# Patient Record
Sex: Female | Born: 1955
Health system: Southern US, Community
[De-identification: ages and names within clinical notes are randomized; demographics above are authoritative.]

## PROBLEM LIST (undated history)

## (undated) DIAGNOSIS — M51369 Other intervertebral disc degeneration, lumbar region without mention of lumbar back pain or lower extremity pain: Secondary | ICD-10-CM

## (undated) DIAGNOSIS — R21 Rash and other nonspecific skin eruption: Secondary | ICD-10-CM

## (undated) DIAGNOSIS — E039 Hypothyroidism, unspecified: Secondary | ICD-10-CM

## (undated) DIAGNOSIS — R2242 Localized swelling, mass and lump, left lower limb: Secondary | ICD-10-CM

## (undated) DIAGNOSIS — M5136 Other intervertebral disc degeneration, lumbar region: Secondary | ICD-10-CM

## (undated) DIAGNOSIS — H04123 Dry eye syndrome of bilateral lacrimal glands: Secondary | ICD-10-CM

## (undated) DIAGNOSIS — D510 Vitamin B12 deficiency anemia due to intrinsic factor deficiency: Secondary | ICD-10-CM

## (undated) DIAGNOSIS — R1310 Dysphagia, unspecified: Secondary | ICD-10-CM

## (undated) DIAGNOSIS — M3390 Dermatopolymyositis, unspecified, organ involvement unspecified: Secondary | ICD-10-CM

## (undated) DIAGNOSIS — R682 Dry mouth, unspecified: Secondary | ICD-10-CM

## (undated) DIAGNOSIS — C4491 Basal cell carcinoma of skin, unspecified: Secondary | ICD-10-CM

## (undated) HISTORY — DX: Basal cell carcinoma of skin, unspecified: C44.91

## (undated) HISTORY — DX: Vitamin B12 deficiency anemia due to intrinsic factor deficiency: D51.0

## (undated) HISTORY — PX: OTHER SURGICAL HISTORY: SHX169

## (undated) HISTORY — DX: Hypothyroidism, unspecified: E03.9

---

## 1996-09-21 HISTORY — PX: BACK SURGERY: SHX140

## 2001-04-19 ENCOUNTER — Encounter: Payer: Self-pay | Admitting: Unknown Physician Specialty

## 2001-04-19 ENCOUNTER — Encounter: Admission: RE | Admit: 2001-04-19 | Discharge: 2001-04-19 | Payer: Self-pay | Admitting: Unknown Physician Specialty

## 2003-07-17 ENCOUNTER — Encounter: Admission: RE | Admit: 2003-07-17 | Discharge: 2003-07-17 | Payer: Self-pay | Admitting: Unknown Physician Specialty

## 2004-08-19 ENCOUNTER — Encounter: Admission: RE | Admit: 2004-08-19 | Discharge: 2004-08-19 | Payer: Self-pay | Admitting: Unknown Physician Specialty

## 2005-07-16 ENCOUNTER — Other Ambulatory Visit: Admission: RE | Admit: 2005-07-16 | Discharge: 2005-07-16 | Payer: Self-pay | Admitting: Unknown Physician Specialty

## 2005-12-18 ENCOUNTER — Encounter: Admission: RE | Admit: 2005-12-18 | Discharge: 2005-12-18 | Payer: Self-pay | Admitting: Unknown Physician Specialty

## 2007-01-05 ENCOUNTER — Encounter: Admission: RE | Admit: 2007-01-05 | Discharge: 2007-01-05 | Payer: Self-pay | Admitting: Unknown Physician Specialty

## 2008-09-24 ENCOUNTER — Encounter: Admission: RE | Admit: 2008-09-24 | Discharge: 2008-09-24 | Payer: Self-pay | Admitting: Unknown Physician Specialty

## 2010-10-11 ENCOUNTER — Encounter: Payer: Self-pay | Admitting: Unknown Physician Specialty

## 2011-02-17 ENCOUNTER — Other Ambulatory Visit: Payer: Self-pay | Admitting: Unknown Physician Specialty

## 2011-02-17 DIAGNOSIS — Z1231 Encounter for screening mammogram for malignant neoplasm of breast: Secondary | ICD-10-CM

## 2011-03-17 ENCOUNTER — Ambulatory Visit: Payer: Self-pay

## 2011-04-02 ENCOUNTER — Ambulatory Visit
Admission: RE | Admit: 2011-04-02 | Discharge: 2011-04-02 | Disposition: A | Discharge status: Home / Self Care | Payer: No Typology Code available for payment source | Source: Ambulatory Visit | Attending: Unknown Physician Specialty | Admitting: Unknown Physician Specialty

## 2011-04-02 DIAGNOSIS — Z1231 Encounter for screening mammogram for malignant neoplasm of breast: Secondary | ICD-10-CM

## 2011-12-29 ENCOUNTER — Other Ambulatory Visit: Payer: Self-pay | Admitting: Neurosurgery

## 2011-12-29 DIAGNOSIS — M47816 Spondylosis without myelopathy or radiculopathy, lumbar region: Secondary | ICD-10-CM

## 2011-12-30 ENCOUNTER — Ambulatory Visit
Admission: RE | Admit: 2011-12-30 | Discharge: 2011-12-30 | Disposition: A | Discharge status: Home / Self Care | Payer: No Typology Code available for payment source | Source: Ambulatory Visit | Attending: Neurosurgery | Admitting: Neurosurgery

## 2011-12-30 VITALS — BP 168/80 | HR 67 | Ht 68.0 in | Wt 175.0 lb

## 2011-12-30 DIAGNOSIS — M47816 Spondylosis without myelopathy or radiculopathy, lumbar region: Secondary | ICD-10-CM

## 2011-12-30 MED ORDER — METHYLPREDNISOLONE ACETATE 40 MG/ML INJ SUSP (RADIOLOG
80.0000 mg | Freq: Once | INTRAMUSCULAR | Status: AC
  Administered 2011-12-30: 80 mg via EPIDURAL

## 2011-12-30 MED ORDER — IOHEXOL 180 MG/ML  SOLN
1.0000 mL | Freq: Once | INTRAMUSCULAR | Status: AC | PRN
  Administered 2011-12-30: 1 mL via INTRA_ARTICULAR

## 2011-12-30 NOTE — Discharge Instructions (Signed)

## 2012-08-29 ENCOUNTER — Other Ambulatory Visit: Payer: Self-pay | Admitting: Internal Medicine

## 2012-08-29 DIAGNOSIS — Z1231 Encounter for screening mammogram for malignant neoplasm of breast: Secondary | ICD-10-CM

## 2013-01-05 ENCOUNTER — Encounter: Payer: Self-pay | Admitting: Cardiology

## 2013-01-11 ENCOUNTER — Ambulatory Visit (INDEPENDENT_AMBULATORY_CARE_PROVIDER_SITE_OTHER): Payer: No Typology Code available for payment source

## 2013-01-11 DIAGNOSIS — G5602 Carpal tunnel syndrome, left upper limb: Secondary | ICD-10-CM

## 2013-01-11 DIAGNOSIS — G5601 Carpal tunnel syndrome, right upper limb: Secondary | ICD-10-CM

## 2013-01-11 DIAGNOSIS — G5600 Carpal tunnel syndrome, unspecified upper limb: Secondary | ICD-10-CM

## 2013-01-11 NOTE — Procedures (Signed)
  HISTORY:  Carmen Woods is a 57 year old patient with a several year history of bilateral numbness and tingling involving the hands and fingertips, with some discomfort radiating to the forearm. The patient is being evaluated for possible carpal tunnel syndrome.  NERVE CONDUCTION STUDIES:  Nerve conduction studies were performed on both upper extremities. The distal motor latencies for the median nerves were prolonged, with normal motor amplitudes for these nerves bilaterally. The distal motor latencies and motor amplitudes for the ulnar nerves were normal bilaterally. The F wave latencies for the median nerves were prolonged bilaterally, normal for the ulnar nerves bilaterally. The nerve conduction velocities for the median and ulnar nerves were normal bilaterally. The sensory latencies for the median nerves were prolonged bilaterally, normal for the ulnar nerves bilaterally.  EMG STUDIES:  EMG study was not performed.  IMPRESSION:  Nerve conduction studies done on both upper extremities revealed evidence of bilateral carpal tunnel syndrome of mild severity. No other significant abnormalities were seen.  Marlan Palau MD 01/11/2013 5:10 PM  [facility] [address] [place], [place] [address]  Phone [phone] Fax [phone]

## 2013-01-19 ENCOUNTER — Encounter: Payer: Self-pay | Admitting: *Deleted

## 2013-01-19 ENCOUNTER — Telehealth: Payer: Self-pay | Admitting: Cardiology

## 2013-01-19 ENCOUNTER — Ambulatory Visit (INDEPENDENT_AMBULATORY_CARE_PROVIDER_SITE_OTHER): Payer: No Typology Code available for payment source | Admitting: Cardiology

## 2013-01-19 ENCOUNTER — Other Ambulatory Visit: Payer: Self-pay | Admitting: *Deleted

## 2013-01-19 ENCOUNTER — Encounter: Payer: Self-pay | Admitting: Cardiology

## 2013-01-19 VITALS — BP 130/76 | HR 68 | Ht 68.0 in | Wt 174.0 lb

## 2013-01-19 DIAGNOSIS — I340 Nonrheumatic mitral (valve) insufficiency: Secondary | ICD-10-CM | POA: Insufficient documentation

## 2013-01-19 DIAGNOSIS — I059 Rheumatic mitral valve disease, unspecified: Secondary | ICD-10-CM

## 2013-01-19 DIAGNOSIS — R072 Precordial pain: Secondary | ICD-10-CM | POA: Insufficient documentation

## 2013-01-19 NOTE — Telephone Encounter (Signed)
stress echocardiogram Weight 174  Diagnosis: 786.51  Friday, May 9th, 2014 @ [facility]

## 2013-01-19 NOTE — Assessment & Plan Note (Signed)
Thickened valve with only trace mitral regurgitation..Doubt that this is of any major clinical significance at this time.

## 2013-01-19 NOTE — Telephone Encounter (Signed)
No precert required 

## 2013-01-19 NOTE — Progress Notes (Signed)
   Clinical Summary Carmen Woods is a 57 y.o.female referred for cardiology consultation by Dr. Sherril Croon. She reports a history of chest tightness, usually fairly constant and associated with emotional stress. She also has had problems with carpal tunnel syndrome and chronic back pain. 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 There is no clear exertional precipitant to her symptoms or necessarily any worsening in symptoms with exertion.  Recent echocardiogram done through [facility] internal medicine demonstrated mild LVH with LVEF 60-65%, normal left atrial size, mild to moderate mitral valve thickening with trace mitral regurgitation, minimally sclerotic aortic valve, mild tricuspid regurgitation, no pericardial effusion. I reviewed the results with her today and reassured her overall. There were no major valvular lesions that would explain any symptoms or require further testing at this particular time.  She has not undergone any previous ischemic evaluations. Baseline ECG today is normal.   Allergies  Allergen Reactions  . Hydrocodone Nausea And Vomiting    Cough syrup and pills    Current Outpatient Prescriptions  Medication Sig Dispense Refill  . diazepam (VALIUM) 5 MG tablet Take 5 mg by mouth every 6 (six) hours as needed for anxiety.      . fexofenadine (ALLEGRA) 180 MG tablet Take 180 mg by mouth daily as needed.       Marland Kitchen levothyroxine (SYNTHROID, LEVOTHROID) 50 MCG tablet Take 50 mcg by mouth daily before breakfast.       No current facility-administered medications for this visit.    Past Medical History  Diagnosis Date  . Bilateral carpal tunnel syndrome   . Hypothyroidism     Past Surgical History  Procedure Laterality Date  . Back surgery  1998    Family History  Problem Relation Age of Onset  . Diabetes Father   . Kidney disease Sister 44  . Heart Problems Mother     Pacermaker  . Breast cancer Mother     Social History Ms.  Woods reports that she has never smoked. She has never used smokeless tobacco. Carmen Woods reports that she does not drink alcohol.  Review of Systems No palpitations or syncope. No orthopnea or PND. No leg edema. Otherwise negative except as outlined.  Physical Examination Filed Vitals:   01/19/13 1251  BP: 130/76  Pulse: 68   Filed Weights   01/19/13 1251  Weight: 174 lb (78.926 kg)   No acute distress. HEENT: Conjunctiva and lids normal, oropharynx clear. Neck: Supple, no elevated JVP or carotid bruits, no thyromegaly. Lungs: Clear to auscultation, nonlabored breathing at rest. Cardiac: Regular rate and rhythm, no S3 or significant systolic murmur, no pericardial rub. Abdomen: Soft, nontender, bowel sounds present. Extremities: No pitting edema, distal pulses 2+. Skin: Warm and dry. Musculoskeletal: No kyphosis. Neuropsychiatric: Alert and oriented x3, affect grossly appropriate.   Problem List and Plan   Precordial pain Atypical in description. Baseline ECG is normal. She does have some family history of heart disease,not entirely clear that this was premature CAD however. Our plan is baseline risk stratification via exercise echocardiogram. We will call her with the results.  Mitral regurgitation Thickened valve with only trace mitral regurgitation..Doubt that this is of any major clinical significance at this time.    Jonelle Sidle, M.D., F.A.C.C.

## 2013-01-19 NOTE — Assessment & Plan Note (Signed)
Atypical in description. Baseline ECG is normal. She does have some family history of heart disease,not entirely clear that this was premature CAD however. Our plan is baseline risk stratification via exercise echocardiogram. We will call her with the results.

## 2013-01-19 NOTE — Patient Instructions (Addendum)
Your physician recommends that you continue on your current medications as directed. Please refer to the Current Medication list given to you today.  Your physician has requested that you have a stress echocardiogram. For further information please visit [email or web address]. Please follow instruction sheet as given.   

## 2013-01-27 DIAGNOSIS — R079 Chest pain, unspecified: Secondary | ICD-10-CM

## 2013-01-30 ENCOUNTER — Telehealth: Payer: Self-pay | Admitting: Cardiology

## 2013-01-30 NOTE — Telephone Encounter (Signed)
Came by for results of test from last week you can contact her on her cell phone

## 2013-01-30 NOTE — Telephone Encounter (Signed)
Patient informed that she would be notified once reviewed by provider.

## 2013-02-01 ENCOUNTER — Ambulatory Visit: Payer: No Typology Code available for payment source

## 2013-02-02 ENCOUNTER — Telehealth: Payer: Self-pay | Admitting: *Deleted

## 2013-02-02 NOTE — Telephone Encounter (Signed)
Patient informed. 

## 2013-02-02 NOTE — Telephone Encounter (Signed)
Message copied by Eustace Moore on Thu Feb 02, 2013  9:12 AM ------      Message from: MCDOWELL, Illene Bolus      Created: Wed Feb 01, 2013  9:52 PM       Reviewed. Negative for ischemia, low risk study. Please let her know. No further cardiac testing anticipated now. ------

## 2013-02-07 ENCOUNTER — Ambulatory Visit: Payer: No Typology Code available for payment source

## 2013-03-13 ENCOUNTER — Other Ambulatory Visit: Payer: Self-pay | Admitting: Internal Medicine

## 2013-03-13 DIAGNOSIS — Z1231 Encounter for screening mammogram for malignant neoplasm of breast: Secondary | ICD-10-CM

## 2013-03-31 ENCOUNTER — Ambulatory Visit
Admission: RE | Admit: 2013-03-31 | Discharge: 2013-03-31 | Disposition: A | Discharge status: Home / Self Care | Payer: No Typology Code available for payment source | Source: Ambulatory Visit | Attending: Internal Medicine | Admitting: Internal Medicine

## 2013-03-31 DIAGNOSIS — Z1231 Encounter for screening mammogram for malignant neoplasm of breast: Secondary | ICD-10-CM

## 2014-05-29 ENCOUNTER — Other Ambulatory Visit: Payer: Self-pay

## 2014-05-29 DIAGNOSIS — Z1231 Encounter for screening mammogram for malignant neoplasm of breast: Secondary | ICD-10-CM

## 2014-05-29 DIAGNOSIS — Z803 Family history of malignant neoplasm of breast: Secondary | ICD-10-CM

## 2014-06-08 ENCOUNTER — Encounter (INDEPENDENT_AMBULATORY_CARE_PROVIDER_SITE_OTHER): Payer: Self-pay

## 2014-06-08 ENCOUNTER — Ambulatory Visit
Admission: RE | Admit: 2014-06-08 | Discharge: 2014-06-08 | Disposition: A | Discharge status: Home / Self Care | Payer: PRIVATE HEALTH INSURANCE | Source: Ambulatory Visit

## 2014-06-08 DIAGNOSIS — Z803 Family history of malignant neoplasm of breast: Secondary | ICD-10-CM

## 2014-06-08 DIAGNOSIS — Z1231 Encounter for screening mammogram for malignant neoplasm of breast: Secondary | ICD-10-CM

## 2014-11-20 HISTORY — PX: BELPHAROPTOSIS REPAIR: SHX369

## 2014-12-21 DIAGNOSIS — M3313 Other dermatomyositis without myopathy: Secondary | ICD-10-CM

## 2014-12-21 DIAGNOSIS — M3390 Dermatopolymyositis, unspecified, organ involvement unspecified: Secondary | ICD-10-CM

## 2014-12-21 DIAGNOSIS — R2242 Localized swelling, mass and lump, left lower limb: Secondary | ICD-10-CM

## 2014-12-21 HISTORY — DX: Localized swelling, mass and lump, left lower limb: R22.42

## 2014-12-21 HISTORY — DX: Dermatopolymyositis, unspecified, organ involvement unspecified: M33.90

## 2014-12-21 HISTORY — DX: Other dermatomyositis without myopathy: M33.13

## 2014-12-25 ENCOUNTER — Other Ambulatory Visit: Payer: Self-pay | Admitting: General Surgery

## 2015-01-01 ENCOUNTER — Encounter (HOSPITAL_BASED_OUTPATIENT_CLINIC_OR_DEPARTMENT_OTHER): Payer: Self-pay | Admitting: *Deleted

## 2015-01-04 NOTE — H&P (Signed)
[place]  Location: [facility] Patient #: [id number] DOB: 10-24-1955 Married / Language: English / Race: White Female       History of Present Illness    The patient is a 59 year old female who presents with a complaint of dermatomyositis. This patient is referred to me by Dr.Shaili Deveshwar for a thigh muscle biopsy to rule out dermatomyositis. Dr. Woody Seller is her PCP. The patient reports a 30 pound weight loss in the last year. Skin rash on hands and left thigh, upper and lower extremity weakness. She's had a skin biopsy by her dermatologist that shows interface dermatitis and some perivascular lymphocytic changes. There is a mild elevated elevation of CK LFTs and alkaline phosphatase. Her ANA is positive. CT scans have been negative. Mammogram and colonoscopy are negative. Dermatomyositis is thought to be possible. She has been on prednisone for a month. Past history is significant for hypothyroidism, vitamin D deficiency, and a spine fusion by Dr. Carloyn Manner. Family history is positive for rheumatoid arthritis. She lives with her husband has Parkinson's disease. 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 Denies tobacco. Alcohol occasionally.   Other Problems Anxiety Disorder Arthritis Back Pain Hemorrhoids Thyroid Disease  Past Surgical History  Oral Surgery Spinal Surgery - Lower Back  Diagnostic Studies History  Colonoscopy 1-5 years ago Mammogram 1-3 years ago Pap Smear 1-5 years ago  Allergies  Hydrocodone-Acetaminophen *ANALGESICS - OPIOID*  Medication History Levothyroxine Sodium (50MCG Tablet, Oral) Active. Diazepam (5MG  Tablet, Oral) Active. Allegra (180MG  Tablet, Oral) Active. Medications Reconciled  Social History  Alcohol use Occasional alcohol use. Caffeine use Carbonated beverages, Coffee, Tea. No drug use Tobacco use Never smoker.  Family History  Arthritis  Mother, Sister. Breast Cancer Mother. Heart disease in female family member before age 6 Kidney Disease Sister. Prostate Cancer Father. Respiratory Condition Mother, Sister. Thyroid problems Father, Sister.  Pregnancy / Birth History  Age at menarche 29 years. Age of menopause 74-50 Gravida 1 Maternal age 17-20 Para 1  Review of Systems General Present- Appetite Loss, Fatigue and Weight Loss. Not Present- Chills, Fever, Night Sweats and Weight Gain. Skin Present- Dryness and Rash. Not Present- Change in Wart/Mole, Hives, Jaundice, New Lesions, Non-Healing Wounds and Ulcer. HEENT Present- Seasonal Allergies and Wears glasses/contact lenses. Not Present- Earache, Hearing Loss, Hoarseness, Nose Bleed, Oral Ulcers, Ringing in the Ears, Sinus Pain, Sore Throat, Visual Disturbances and Yellow Eyes. Breast Not Present- Breast Mass, Breast Pain, Nipple Discharge and Skin Changes. Cardiovascular Present- Rapid Heart Rate and Swelling of Extremities. Not Present- Chest Pain, Difficulty Breathing Lying Down, Leg Cramps, Palpitations and Shortness of Breath. Gastrointestinal Present- Hemorrhoids. Not Present- Abdominal Pain, Bloating, Bloody Stool, Change in Bowel Habits, Chronic diarrhea, Constipation, Difficulty Swallowing, Excessive gas, Gets full quickly at meals, Indigestion, Nausea, Rectal Pain and Vomiting. Female Genitourinary Not Present- Frequency, Nocturia, Painful Urination, Pelvic Pain and Urgency. Musculoskeletal Present- Back Pain, Joint Pain and Muscle Weakness. Not Present- Joint Stiffness, Muscle Pain and Swelling of Extremities. Neurological Present- Weakness. Not Present- Decreased Memory, Fainting, Headaches, Numbness, Seizures, Tingling, Tremor and Trouble walking. Psychiatric Present- Anxiety and Change in Sleep Pattern. Not Present- Bipolar, Depression, Fearful and Frequent crying. Endocrine Not Present- Cold Intolerance, Excessive Hunger, Hair Changes, Heat  Intolerance, Hot flashes and New Diabetes. Hematology Not Present- Easy Bruising, Excessive bleeding, Gland problems, HIV and Persistent Infections.   Vitals  Weight: 151 lb Height: 68in Body Surface Area: 1.81 m Body Mass Index: 22.96 kg/m  Temp.: 97.5F(Temporal)  Pulse: 73 (Regular)  Resp.: 17 (Unlabored)  BP: 130/68 (Sitting, Left Arm, Standard)    Physical Exam  General Mental Status-Alert. General Appearance-Consistent with stated age. Hydration-Well hydrated. Voice-Normal.  Integumentary Note: There is a red, splotchy rash of the left proximal thigh and both hands.   Head and Neck Head-normocephalic, atraumatic with no lesions or palpable masses. Trachea-midline. Thyroid Gland Characteristics - normal size and consistency.  Eye Eyeball - Bilateral-Extraocular movements intact. Sclera/Conjunctiva - Bilateral-No scleral icterus.  Chest and Lung Exam Chest and lung exam reveals -quiet, even and easy respiratory effort with no use of accessory muscles and on auscultation, normal breath sounds, no adventitious sounds and normal vocal resonance. Inspection Chest Wall - Normal. Back - normal.  Cardiovascular Cardiovascular examination reveals -normal heart sounds, regular rate and rhythm with no murmurs and normal pedal pulses bilaterally.  Abdomen Inspection Inspection of the abdomen reveals - No Hernias. Skin - Scar - no surgical scars. Palpation/Percussion Palpation and Percussion of the abdomen reveal - Soft, Non Tender, No Rebound tenderness, No Rigidity (guarding) and No hepatosplenomegaly. Auscultation Auscultation of the abdomen reveals - Bowel sounds normal.  Neurologic Neurologic evaluation reveals -alert and oriented x 3 with no impairment of recent or remote memory. Mental Status-Normal.  Musculoskeletal Normal Exam - Left-Upper Extremity Strength Normal and Lower Extremity Strength Normal. Normal Exam -  Right-Upper Extremity Strength Normal and Lower Extremity Strength Normal. Note: Subtle muscle weakness proximal upper and lower extremities. Ambulatory.   Lymphatic Head & Neck  General Head & Neck Lymphatics: Bilateral - Description - Normal. Axillary  General Axillary Region: Bilateral - Description - Normal. Tenderness - Non Tender. Femoral & Inguinal  Generalized Femoral & Inguinal Lymphatics: Bilateral - Description - Normal. Tenderness - Non Tender.    Assessment & Plan  DERMATOMYOSITIS (710.3  M33.90) Current Plans  Schedule for Surgery Because of your muscle weakness, abnormal lab tests, and skin rash, I agree that muscle biopsy is indicated to rule out dermatomyositis. you state that Dr. Estanislado Pandy for request a biopsy of your thigh, and so you will be scheduled for a left proximal thigh muscle biopsy under general anesthesia. you will be able to go home the same day but someone will need to drive you. We have discussed the techniques and risks of this surgery in detail. This will be scheduled at her earliest convenience.  HYPOTHYROIDISM, ADULT (244.9  E03.9) ANA POSITIVE (795.79  R76.8)   Edsel Petrin. Dalbert Batman, M.D., [facility] General and Minimally invasive Surgery Breast and Colorectal Surgery Office:   [phone] Pager:   [phone]

## 2015-01-07 ENCOUNTER — Encounter (HOSPITAL_BASED_OUTPATIENT_CLINIC_OR_DEPARTMENT_OTHER)
Admission: RE | Disposition: A | Discharge status: Home / Self Care | Payer: Self-pay | Source: Ambulatory Visit | Attending: General Surgery

## 2015-01-07 ENCOUNTER — Encounter (HOSPITAL_BASED_OUTPATIENT_CLINIC_OR_DEPARTMENT_OTHER): Payer: Self-pay | Admitting: Anesthesiology

## 2015-01-07 ENCOUNTER — Ambulatory Visit (HOSPITAL_BASED_OUTPATIENT_CLINIC_OR_DEPARTMENT_OTHER)
Admission: RE | Admit: 2015-01-07 | Discharge: 2015-01-07 | Disposition: A | Discharge status: Home / Self Care | Payer: PRIVATE HEALTH INSURANCE | Source: Ambulatory Visit | Attending: General Surgery | Admitting: General Surgery

## 2015-01-07 ENCOUNTER — Ambulatory Visit (HOSPITAL_BASED_OUTPATIENT_CLINIC_OR_DEPARTMENT_OTHER): Payer: PRIVATE HEALTH INSURANCE | Admitting: Anesthesiology

## 2015-01-07 DIAGNOSIS — E559 Vitamin D deficiency, unspecified: Secondary | ICD-10-CM | POA: Diagnosis not present

## 2015-01-07 DIAGNOSIS — M3392 Dermatopolymyositis, unspecified with myopathy: Secondary | ICD-10-CM | POA: Insufficient documentation

## 2015-01-07 DIAGNOSIS — G729 Myopathy, unspecified: Secondary | ICD-10-CM | POA: Diagnosis present

## 2015-01-07 DIAGNOSIS — E039 Hypothyroidism, unspecified: Secondary | ICD-10-CM | POA: Diagnosis not present

## 2015-01-07 DIAGNOSIS — Z981 Arthrodesis status: Secondary | ICD-10-CM | POA: Insufficient documentation

## 2015-01-07 DIAGNOSIS — Z886 Allergy status to analgesic agent status: Secondary | ICD-10-CM | POA: Diagnosis not present

## 2015-01-07 DIAGNOSIS — Z79899 Other long term (current) drug therapy: Secondary | ICD-10-CM | POA: Diagnosis not present

## 2015-01-07 DIAGNOSIS — F419 Anxiety disorder, unspecified: Secondary | ICD-10-CM | POA: Insufficient documentation

## 2015-01-07 DIAGNOSIS — M1990 Unspecified osteoarthritis, unspecified site: Secondary | ICD-10-CM | POA: Diagnosis not present

## 2015-01-07 DIAGNOSIS — M3313 Other dermatomyositis without myopathy: Secondary | ICD-10-CM | POA: Diagnosis present

## 2015-01-07 DIAGNOSIS — M3390 Dermatopolymyositis, unspecified, organ involvement unspecified: Secondary | ICD-10-CM | POA: Diagnosis present

## 2015-01-07 HISTORY — DX: Dysphagia, unspecified: R13.10

## 2015-01-07 HISTORY — DX: Dermatopolymyositis, unspecified, organ involvement unspecified: M33.90

## 2015-01-07 HISTORY — DX: Localized swelling, mass and lump, left lower limb: R22.42

## 2015-01-07 HISTORY — DX: Rash and other nonspecific skin eruption: R21

## 2015-01-07 HISTORY — DX: Other intervertebral disc degeneration, lumbar region: M51.36

## 2015-01-07 HISTORY — DX: Dry eye syndrome of bilateral lacrimal glands: H04.123

## 2015-01-07 HISTORY — DX: Other intervertebral disc degeneration, lumbar region without mention of lumbar back pain or lower extremity pain: M51.369

## 2015-01-07 HISTORY — PX: MUSCLE BIOPSY: SHX716

## 2015-01-07 HISTORY — DX: Dry mouth, unspecified: R68.2

## 2015-01-07 LAB — POCT HEMOGLOBIN-HEMACUE: Hemoglobin: 13.3 g/dL (ref 12.0–15.0)

## 2015-01-07 SURGERY — MUSCLE BIOPSY
Anesthesia: General | Site: Thigh | Laterality: Left

## 2015-01-07 MED ORDER — CEFAZOLIN SODIUM-DEXTROSE 2-3 GM-% IV SOLR
2.0000 g | INTRAVENOUS | Status: AC
  Administered 2015-01-07: 2 g via INTRAVENOUS

## 2015-01-07 MED ORDER — CHLORHEXIDINE GLUCONATE 4 % EX LIQD: 1.0000 "application " | Freq: Once | CUTANEOUS | Status: DC

## 2015-01-07 MED ORDER — BUPIVACAINE-EPINEPHRINE 0.5% -1:200000 IJ SOLN
INTRAMUSCULAR | Status: DC | PRN
  Administered 2015-01-07: 7 mL

## 2015-01-07 MED ORDER — EPHEDRINE SULFATE 50 MG/ML IJ SOLN
INTRAMUSCULAR | Status: DC | PRN
  Administered 2015-01-07: 10 mg via INTRAVENOUS

## 2015-01-07 MED ORDER — LIDOCAINE HCL (CARDIAC) 20 MG/ML IV SOLN
INTRAVENOUS | Status: DC | PRN
  Administered 2015-01-07: 80 mg via INTRAVENOUS

## 2015-01-07 MED ORDER — CEFAZOLIN SODIUM-DEXTROSE 2-3 GM-% IV SOLR
INTRAVENOUS | Status: AC
  Filled 2015-01-07: qty 50

## 2015-01-07 MED ORDER — ONDANSETRON HCL 4 MG/2ML IJ SOLN
INTRAMUSCULAR | Status: DC | PRN
  Administered 2015-01-07: 4 mg via INTRAVENOUS

## 2015-01-07 MED ORDER — BUPIVACAINE-EPINEPHRINE (PF) 0.5% -1:200000 IJ SOLN
INTRAMUSCULAR | Status: AC
  Filled 2015-01-07: qty 30

## 2015-01-07 MED ORDER — DEXAMETHASONE SODIUM PHOSPHATE 4 MG/ML IJ SOLN
INTRAMUSCULAR | Status: DC | PRN
  Administered 2015-01-07: 10 mg via INTRAVENOUS

## 2015-01-07 MED ORDER — FENTANYL CITRATE 0.05 MG/ML IJ SOLN
50.0000 ug | INTRAMUSCULAR | Status: DC | PRN
  Administered 2015-01-07: 100 ug via INTRAVENOUS

## 2015-01-07 MED ORDER — IBUPROFEN 200 MG PO TABS: 200.0000 mg | ORAL_TABLET | Freq: Four times a day (QID) | ORAL | Status: DC | PRN

## 2015-01-07 MED ORDER — IBUPROFEN 100 MG/5ML PO SUSP: 200.0000 mg | Freq: Four times a day (QID) | ORAL | Status: DC | PRN

## 2015-01-07 MED ORDER — FENTANYL CITRATE (PF) 100 MCG/2ML IJ SOLN
INTRAMUSCULAR | Status: AC
  Filled 2015-01-07: qty 4

## 2015-01-07 MED ORDER — HYDROCODONE-ACETAMINOPHEN 5-325 MG PO TABS: 1.0000 | ORAL_TABLET | Freq: Four times a day (QID) | ORAL | Status: DC | PRN

## 2015-01-07 MED ORDER — LACTATED RINGERS IV SOLN
INTRAVENOUS | Status: DC
  Administered 2015-01-07: 11:00:00 via INTRAVENOUS

## 2015-01-07 MED ORDER — HYDROMORPHONE HCL 1 MG/ML IJ SOLN
INTRAMUSCULAR | Status: AC
  Filled 2015-01-07: qty 1

## 2015-01-07 MED ORDER — PROPOFOL 10 MG/ML IV BOLUS
INTRAVENOUS | Status: DC | PRN
  Administered 2015-01-07: 200 mg via INTRAVENOUS

## 2015-01-07 MED ORDER — HYDROMORPHONE HCL 1 MG/ML IJ SOLN
0.2500 mg | INTRAMUSCULAR | Status: DC | PRN
  Administered 2015-01-07 (×2): 0.5 mg via INTRAVENOUS

## 2015-01-07 MED ORDER — MEPERIDINE HCL 25 MG/ML IJ SOLN: 6.2500 mg | INTRAMUSCULAR | Status: DC | PRN

## 2015-01-07 MED ORDER — MIDAZOLAM HCL 2 MG/2ML IJ SOLN
INTRAMUSCULAR | Status: AC
  Filled 2015-01-07: qty 2

## 2015-01-07 MED ORDER — MIDAZOLAM HCL 2 MG/ML PO SYRP: 12.0000 mg | ORAL_SOLUTION | Freq: Once | ORAL | Status: DC | PRN

## 2015-01-07 MED ORDER — KETOROLAC TROMETHAMINE 30 MG/ML IJ SOLN: 30.0000 mg | Freq: Once | INTRAMUSCULAR | Status: DC | PRN

## 2015-01-07 MED ORDER — MIDAZOLAM HCL 2 MG/2ML IJ SOLN
1.0000 mg | INTRAMUSCULAR | Status: DC | PRN
  Administered 2015-01-07: 2 mg via INTRAVENOUS

## 2015-01-07 SURGICAL SUPPLY — 50 items
BENZOIN TINCTURE PRP APPL 2/3 (GAUZE/BANDAGES/DRESSINGS) IMPLANT
BLADE CLIPPER SURG (BLADE) IMPLANT
BLADE HEX COATED 2.75 (ELECTRODE) IMPLANT
BLADE SURG 15 STRL LF DISP TIS (BLADE) ×1 IMPLANT
BLADE SURG 15 STRL SS (BLADE) ×2
CANISTER SUCT 1200ML W/VALVE (MISCELLANEOUS) IMPLANT
CHLORAPREP W/TINT 26ML (MISCELLANEOUS) ×3 IMPLANT
CLOSURE WOUND 1/2 X4 (GAUZE/BANDAGES/DRESSINGS)
COVER BACK TABLE 60X90IN (DRAPES) ×3 IMPLANT
COVER MAYO STAND STRL (DRAPES) ×3 IMPLANT
DECANTER SPIKE VIAL GLASS SM (MISCELLANEOUS) IMPLANT
DERMABOND ADVANCED (GAUZE/BANDAGES/DRESSINGS) ×2
DERMABOND ADVANCED .7 DNX12 (GAUZE/BANDAGES/DRESSINGS) ×1 IMPLANT
DRAPE LAPAROTOMY T 102X78X121 (DRAPES) ×3 IMPLANT
DRAPE UTILITY XL STRL (DRAPES) ×3 IMPLANT
DRSG TEGADERM 4X4.75 (GAUZE/BANDAGES/DRESSINGS) IMPLANT
ELECT REM PT RETURN 9FT ADLT (ELECTROSURGICAL) ×3
ELECTRODE REM PT RTRN 9FT ADLT (ELECTROSURGICAL) ×1 IMPLANT
GAUZE SPONGE 4X4 16PLY XRAY LF (GAUZE/BANDAGES/DRESSINGS) IMPLANT
GLOVE BIOGEL PI IND STRL 7.0 (GLOVE) ×1 IMPLANT
GLOVE BIOGEL PI INDICATOR 7.0 (GLOVE) ×2
GLOVE ECLIPSE 6.5 STRL STRAW (GLOVE) ×3 IMPLANT
GLOVE EUDERMIC 7 POWDERFREE (GLOVE) ×3 IMPLANT
GOWN STRL REUS W/ TWL LRG LVL3 (GOWN DISPOSABLE) ×2 IMPLANT
GOWN STRL REUS W/TWL LRG LVL3 (GOWN DISPOSABLE) ×4
NEEDLE HYPO 25X1 1.5 SAFETY (NEEDLE) ×3 IMPLANT
NS IRRIG 1000ML POUR BTL (IV SOLUTION) ×3 IMPLANT
PACK BASIN DAY SURGERY FS (CUSTOM PROCEDURE TRAY) ×3 IMPLANT
PENCIL BUTTON HOLSTER BLD 10FT (ELECTRODE) IMPLANT
SLEEVE SCD COMPRESS KNEE MED (MISCELLANEOUS) ×3 IMPLANT
SPONGE GAUZE 4X4 12PLY STER LF (GAUZE/BANDAGES/DRESSINGS) ×3 IMPLANT
STRIP CLOSURE SKIN 1/2X4 (GAUZE/BANDAGES/DRESSINGS) IMPLANT
SUT MNCRL AB 3-0 PS2 18 (SUTURE) IMPLANT
SUT MNCRL AB 4-0 PS2 18 (SUTURE) ×3 IMPLANT
SUT VIC AB 2-0 SH 27 (SUTURE) ×2
SUT VIC AB 2-0 SH 27XBRD (SUTURE) ×1 IMPLANT
SUT VIC AB 3-0 SH 27 (SUTURE)
SUT VIC AB 3-0 SH 27X BRD (SUTURE) IMPLANT
SUT VIC AB 4-0 SH 27 (SUTURE)
SUT VIC AB 4-0 SH 27XANBCTRL (SUTURE) IMPLANT
SUT VICRYL 3-0 CR8 SH (SUTURE) IMPLANT
SUT VICRYL AB 2 0 TIE (SUTURE) ×1 IMPLANT
SUT VICRYL AB 2 0 TIES (SUTURE) ×2
SYRINGE 10CC LL (SYRINGE) ×3 IMPLANT
TOWEL OR 17X24 6PK STRL BLUE (TOWEL DISPOSABLE) ×3 IMPLANT
TOWEL OR NON WOVEN STRL DISP B (DISPOSABLE) IMPLANT
TRAY DSU PREP LF (CUSTOM PROCEDURE TRAY) IMPLANT
TUBE CONNECTING 20'X1/4 (TUBING)
TUBE CONNECTING 20X1/4 (TUBING) IMPLANT
YANKAUER SUCT BULB TIP NO VENT (SUCTIONS) IMPLANT

## 2015-01-07 NOTE — Discharge Instructions (Signed)
Keep an ice pack on the wound intermittently for the next 24 hours. 15 minutes on and 15 minutes off.  You may shower, starting tomorrow. Do not tub bathe for about 3 weeks  You may drive a car when the pain is less and you do not need the narcotic pain medication.  No heavy work or heavy lifting for 2 weeks  The clear plastic Dermabond covering the incision should wear off in about 3 weeks  Call Dr. Darrel Hoover office with any questions or concerns.   Post Anesthesia Home Care Instructions  Activity: Get plenty of rest for the remainder of the day. A responsible adult should stay with you for 24 hours following the procedure.  For the next 24 hours, DO NOT: -Drive a car -Paediatric nurse -Drink alcoholic beverages -Take any medication unless instructed by your physician -Make any legal decisions or sign important papers.  Meals: Start with liquid foods such as gelatin or soup. Progress to regular foods as tolerated. Avoid greasy, spicy, heavy foods. If nausea and/or vomiting occur, drink only clear liquids until the nausea and/or vomiting subsides. Call your physician if vomiting continues.  Special Instructions/Symptoms: Your throat may feel dry or sore from the anesthesia or the breathing tube placed in your throat during surgery. If this causes discomfort, gargle with warm salt water. The discomfort should disappear within 24 hours.  If you had a scopolamine patch placed behind your ear for the management of post- operative nausea and/or vomiting:  1. The medication in the patch is effective for 72 hours, after which it should be removed.  Wrap patch in a tissue and discard in the trash. Wash hands thoroughly with soap and water. 2. You may remove the patch earlier than 72 hours if you experience unpleasant side effects which may include dry mouth, dizziness or visual disturbances. 3. Avoid touching the patch. Wash your hands with soap and water after contact with the patch.

## 2015-01-07 NOTE — Anesthesia Postprocedure Evaluation (Signed)
  Anesthesia Post-op Note  Patient: Carmen Woods  Procedure(s) Performed: Procedure(s): LEFT THIGH MUSCLE BIOPSY (Left)  Patient Location: PACU  Anesthesia Type: General   Level of Consciousness: awake, alert  and oriented  Airway and Oxygen Therapy: Patient Spontanous Breathing  Post-op Pain: mild  Post-op Assessment: Post-op Vital signs reviewed  Post-op Vital Signs: Reviewed  Last Vitals:  Filed Vitals:   01/07/15 1245  BP: 138/76  Pulse: 68  Temp:   Resp: 12    Complications: No apparent anesthesia complications

## 2015-01-07 NOTE — Anesthesia Procedure Notes (Signed)
Procedure Name: LMA Insertion Date/Time: 01/07/2015 11:50 AM Performed by: Maryella Shivers Pre-anesthesia Checklist: Patient identified, Emergency Drugs available, Suction available and Patient being monitored Patient Re-evaluated:Patient Re-evaluated prior to inductionOxygen Delivery Method: Circle System Utilized Preoxygenation: Pre-oxygenation with 100% oxygen Intubation Type: IV induction Ventilation: Mask ventilation without difficulty LMA: LMA inserted LMA Size: 4.0 Number of attempts: 1 Airway Equipment and Method: Bite block Placement Confirmation: positive ETCO2 Tube secured with: Tape Dental Injury: Teeth and Oropharynx as per pre-operative assessment

## 2015-01-07 NOTE — Transfer of Care (Signed)
Immediate Anesthesia Transfer of Care Note  Patient: Carmen Woods  Procedure(s) Performed: Procedure(s): LEFT THIGH MUSCLE BIOPSY (Left)  Patient Location: PACU  Anesthesia Type:General  Level of Consciousness: sedated  Airway & Oxygen Therapy: Patient Spontanous Breathing and Patient connected to face mask oxygen  Post-op Assessment: Report given to RN and Post -op Vital signs reviewed and stable  Post vital signs: Reviewed and stable  Last Vitals:  Filed Vitals:   01/07/15 1045  BP: 131/66  Pulse: 61  Temp: 36.8 C  Resp: 20    Complications: No apparent anesthesia complications

## 2015-01-07 NOTE — Anesthesia Preprocedure Evaluation (Signed)
Anesthesia Evaluation  Patient identified by MRN, date of birth, ID band Patient awake    Reviewed: Allergy & Precautions, NPO status , Patient's Chart, lab work & pertinent test results  Airway Mallampati: I  TM Distance: >3 FB Neck ROM: Full    Dental  (+) Teeth Intact, Dental Advisory Given   Pulmonary  breath sounds clear to auscultation        Cardiovascular Rhythm:Regular Rate:Normal     Neuro/Psych    GI/Hepatic   Endo/Other  Hypothyroidism   Renal/GU      Musculoskeletal   Abdominal   Peds  Hematology   Anesthesia Other Findings   Reproductive/Obstetrics                             Anesthesia Physical Anesthesia Plan  ASA: II  Anesthesia Plan: General   Post-op Pain Management:    Induction: Intravenous  Airway Management Planned: LMA  Additional Equipment:   Intra-op Plan:   Post-operative Plan: Extubation in OR  Informed Consent: I have reviewed the patients History and Physical, chart, labs and discussed the procedure including the risks, benefits and alternatives for the proposed anesthesia with the patient or authorized representative who has indicated his/her understanding and acceptance.   Dental advisory given  Plan Discussed with: CRNA, Anesthesiologist and Surgeon  Anesthesia Plan Comments:         Anesthesia Quick Evaluation

## 2015-01-07 NOTE — Interval H&P Note (Signed)
History and Physical Interval Note:  01/07/2015 11:32 AM  Carmen Woods  has presented today for surgery, with the diagnosis of left thigh mass, skin rash  The various methods of treatment have been discussed with the patient and family. After consideration of risks, benefits and other options for treatment, the patient has consented to  Procedure(s): LEFT THIGH MUSCLE BIOPSY (Left) as a surgical intervention .  The patient's history has been reviewed, patient examined today,  no change in status, stable for surgery.  I have reviewed the patient's chart and labs.  Questions were answered to the patient's satisfaction.     [name]

## 2015-01-07 NOTE — Op Note (Signed)
Patient Name:           Carmen Woods   Date of Surgery:        01/07/2015  Pre op Diagnosis:      dermatomyositis  Post op Diagnosis:    dermatomyositis  Procedure:                 Left thigh muscle biopsy  Surgeon:                     Edsel Petrin. Dalbert Batman, M.D., FACS  Assistant:                      OR staff  Operative Indications:     The patient is a 59 year old female who presents with a complaint of dermatomyositis. This patient is referred to me by Dr.Shaili Deveshwar for a thigh muscle biopsy to rule out dermatomyositis. Dr. Woody Seller is her PCP. The patient reports a 30 pound weight loss in the last year. Skin rash on hands and left thigh, upper and lower extremity weakness. She's had a skin biopsy by her dermatologist that shows interface dermatitis and some perivascular lymphocytic changes. There is a mild elevated elevation of CK LFTs and alkaline phosphatase. Her ANA is positive. CT scans have been negative. Mammogram and colonoscopy are negative. Dermatomyositis is thought to be possible. She has been on prednisone for a month. Past history is significant for hypothyroidism, vitamin D deficiency, and a spine fusion by Dr. Carloyn Manner. Family history is positive for rheumatoid arthritis. She lives with her husband has Parkinson's disease. 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 Denies tobacco. Alcohol occasionally.  Operative Findings:       I took a 3 cm long by 1.5 cm wide piece of left vastus lateralis muscle. There was nothing unusual about the muscles appearance. This was sent to the lab directly, fresh, rolled up in a slightly moistened gauze.  Procedure in Detail:          Following the induction of general LMA anesthesia the patient's left groin and entire left thigh were prepped and draped in a sterile fashion. Surgical timeout was performed. Intravenous antibiotics were given. 0.5% Marcaine with epinephrine was used as local  infiltration anesthetic. A longitudinal incision was made in the upper left thigh anterior laterally. Dissection was carried down through subcutaneous tissue. The deep muscle fascia was incised. I isolated a nice piece of muscle and clamped proximally and distally, amputated it and placed it in a saline moistened gauze and it was sent directly to the lab with the appropriate history attached. The cut ends of the muscle were ligated with 2-0 Vicryl ties. Hemostasis was excellent and achieved with bipolar cautery. Wound was irrigated. Subcutaneous tissue was closed with 3-0 Vicryl sutures and skin closed with a running subcuticular 4-0 Monocryl and Dermabond. The patient tolerated the procedure well was taken to PACU in stable condition. EBL 10 mL. Counts correct. Complications none.     Edsel Petrin. Dalbert Batman, M.D., FACS General and Minimally Invasive Surgery Breast and Colorectal Surgery  01/07/2015 12:27 PM

## 2015-01-08 ENCOUNTER — Encounter (HOSPITAL_BASED_OUTPATIENT_CLINIC_OR_DEPARTMENT_OTHER): Payer: Self-pay | Admitting: General Surgery

## 2015-01-22 ENCOUNTER — Encounter (HOSPITAL_COMMUNITY): Payer: Self-pay

## 2015-02-11 ENCOUNTER — Other Ambulatory Visit: Payer: Self-pay | Admitting: Nurse Practitioner

## 2015-02-11 ENCOUNTER — Ambulatory Visit
Admission: RE | Admit: 2015-02-11 | Discharge: 2015-02-11 | Disposition: A | Discharge status: Home / Self Care | Payer: PRIVATE HEALTH INSURANCE | Source: Ambulatory Visit | Attending: Nurse Practitioner | Admitting: Nurse Practitioner

## 2015-02-11 DIAGNOSIS — R0789 Other chest pain: Secondary | ICD-10-CM

## 2015-05-15 ENCOUNTER — Ambulatory Visit (INDEPENDENT_AMBULATORY_CARE_PROVIDER_SITE_OTHER): Payer: PRIVATE HEALTH INSURANCE | Admitting: Licensed Clinical Social Worker

## 2015-05-15 DIAGNOSIS — F322 Major depressive disorder, single episode, severe without psychotic features: Secondary | ICD-10-CM

## 2015-05-23 ENCOUNTER — Other Ambulatory Visit: Payer: Self-pay

## 2015-05-23 DIAGNOSIS — Z1231 Encounter for screening mammogram for malignant neoplasm of breast: Secondary | ICD-10-CM

## 2015-06-10 ENCOUNTER — Ambulatory Visit (INDEPENDENT_AMBULATORY_CARE_PROVIDER_SITE_OTHER): Payer: PRIVATE HEALTH INSURANCE | Admitting: Licensed Clinical Social Worker

## 2015-06-10 ENCOUNTER — Ambulatory Visit
Admission: RE | Admit: 2015-06-10 | Discharge: 2015-06-10 | Disposition: A | Discharge status: Home / Self Care | Payer: PRIVATE HEALTH INSURANCE | Source: Ambulatory Visit

## 2015-06-10 DIAGNOSIS — F332 Major depressive disorder, recurrent severe without psychotic features: Secondary | ICD-10-CM | POA: Diagnosis not present

## 2015-06-10 DIAGNOSIS — Z1231 Encounter for screening mammogram for malignant neoplasm of breast: Secondary | ICD-10-CM

## 2015-06-11 ENCOUNTER — Ambulatory Visit: Payer: Self-pay

## 2015-07-16 ENCOUNTER — Other Ambulatory Visit: Payer: Self-pay | Admitting: Neurosurgery

## 2015-07-16 DIAGNOSIS — G8929 Other chronic pain: Secondary | ICD-10-CM

## 2015-07-16 DIAGNOSIS — M545 Low back pain: Principal | ICD-10-CM

## 2015-07-24 ENCOUNTER — Ambulatory Visit
Admission: RE | Admit: 2015-07-24 | Discharge: 2015-07-24 | Disposition: A | Discharge status: Home / Self Care | Payer: PRIVATE HEALTH INSURANCE | Source: Ambulatory Visit | Attending: Neurosurgery | Admitting: Neurosurgery

## 2015-07-24 DIAGNOSIS — G8929 Other chronic pain: Secondary | ICD-10-CM

## 2015-07-24 DIAGNOSIS — M545 Low back pain: Principal | ICD-10-CM

## 2015-07-24 MED ORDER — IOHEXOL 180 MG/ML  SOLN
1.0000 mL | Freq: Once | INTRAMUSCULAR | Status: DC | PRN
  Administered 2015-07-24: 1 mL via EPIDURAL

## 2015-07-24 MED ORDER — METHYLPREDNISOLONE ACETATE 40 MG/ML INJ SUSP (RADIOLOG
120.0000 mg | Freq: Once | INTRAMUSCULAR | Status: AC
  Administered 2015-07-24: 120 mg via EPIDURAL

## 2015-07-24 NOTE — Discharge Instructions (Signed)

## 2015-08-14 ENCOUNTER — Other Ambulatory Visit: Payer: Self-pay | Admitting: Neurosurgery

## 2015-08-14 DIAGNOSIS — M545 Low back pain, unspecified: Secondary | ICD-10-CM

## 2015-08-14 DIAGNOSIS — G8929 Other chronic pain: Secondary | ICD-10-CM

## 2015-08-26 ENCOUNTER — Ambulatory Visit (INDEPENDENT_AMBULATORY_CARE_PROVIDER_SITE_OTHER): Payer: PRIVATE HEALTH INSURANCE | Admitting: Licensed Clinical Social Worker

## 2015-08-26 DIAGNOSIS — F332 Major depressive disorder, recurrent severe without psychotic features: Secondary | ICD-10-CM

## 2015-08-27 ENCOUNTER — Ambulatory Visit
Admission: RE | Admit: 2015-08-27 | Discharge: 2015-08-27 | Disposition: A | Discharge status: Home / Self Care | Payer: PRIVATE HEALTH INSURANCE | Source: Ambulatory Visit | Attending: Neurosurgery | Admitting: Neurosurgery

## 2015-08-27 DIAGNOSIS — M545 Low back pain: Principal | ICD-10-CM

## 2015-08-27 DIAGNOSIS — G8929 Other chronic pain: Secondary | ICD-10-CM

## 2015-08-27 MED ORDER — METHYLPREDNISOLONE ACETATE 40 MG/ML INJ SUSP (RADIOLOG
120.0000 mg | Freq: Once | INTRAMUSCULAR | Status: AC
  Administered 2015-08-27: 120 mg via EPIDURAL

## 2015-08-27 MED ORDER — IOHEXOL 180 MG/ML  SOLN
1.0000 mL | Freq: Once | INTRAMUSCULAR | Status: AC | PRN
Start: 2015-08-27 — End: 2015-08-27
  Administered 2015-08-27: 1 mL via EPIDURAL

## 2015-08-27 NOTE — Discharge Instructions (Signed)

## 2015-09-11 ENCOUNTER — Ambulatory Visit (INDEPENDENT_AMBULATORY_CARE_PROVIDER_SITE_OTHER): Payer: PRIVATE HEALTH INSURANCE | Admitting: Licensed Clinical Social Worker

## 2015-09-11 DIAGNOSIS — F4322 Adjustment disorder with anxiety: Secondary | ICD-10-CM

## 2016-07-07 ENCOUNTER — Encounter: Payer: Self-pay | Admitting: Sports Medicine

## 2016-07-07 ENCOUNTER — Ambulatory Visit (INDEPENDENT_AMBULATORY_CARE_PROVIDER_SITE_OTHER): Payer: BLUE CROSS/BLUE SHIELD | Admitting: Sports Medicine

## 2016-07-07 ENCOUNTER — Ambulatory Visit (INDEPENDENT_AMBULATORY_CARE_PROVIDER_SITE_OTHER): Payer: BLUE CROSS/BLUE SHIELD | Admitting: Rheumatology

## 2016-07-07 ENCOUNTER — Ambulatory Visit (INDEPENDENT_AMBULATORY_CARE_PROVIDER_SITE_OTHER): Payer: BLUE CROSS/BLUE SHIELD

## 2016-07-07 DIAGNOSIS — M3390 Dermatopolymyositis, unspecified, organ involvement unspecified: Secondary | ICD-10-CM | POA: Diagnosis not present

## 2016-07-07 DIAGNOSIS — M79671 Pain in right foot: Secondary | ICD-10-CM

## 2016-07-07 DIAGNOSIS — M5137 Other intervertebral disc degeneration, lumbosacral region: Secondary | ICD-10-CM | POA: Diagnosis not present

## 2016-07-07 DIAGNOSIS — M792 Neuralgia and neuritis, unspecified: Secondary | ICD-10-CM | POA: Diagnosis not present

## 2016-07-07 DIAGNOSIS — M722 Plantar fascial fibromatosis: Secondary | ICD-10-CM

## 2016-07-07 DIAGNOSIS — E559 Vitamin D deficiency, unspecified: Secondary | ICD-10-CM

## 2016-07-07 DIAGNOSIS — M79673 Pain in unspecified foot: Secondary | ICD-10-CM

## 2016-07-07 DIAGNOSIS — Z79899 Other long term (current) drug therapy: Secondary | ICD-10-CM

## 2016-07-07 DIAGNOSIS — R5381 Other malaise: Secondary | ICD-10-CM

## 2016-07-07 DIAGNOSIS — M79641 Pain in right hand: Secondary | ICD-10-CM

## 2016-07-07 MED ORDER — DICLOFENAC SODIUM 75 MG PO TBEC: 75.0000 mg | DELAYED_RELEASE_TABLET | Freq: Two times a day (BID) | ORAL | 0 refills | Status: DC

## 2016-07-07 MED ORDER — TRIAMCINOLONE ACETONIDE 10 MG/ML IJ SUSP: 10.0000 mg | Freq: Once | INTRAMUSCULAR | Status: AC

## 2016-07-07 NOTE — Progress Notes (Signed)
Subjective: Carmen Woods is a 60 y.o. female patient presents to office with complaint of heel pain on the left>right. Patient admits to post static dyskinesia for few months worse over last few weeks days. Patient has treated this problem with stretching and inserts with no relief. Admits also to numbness and pins and needle sensation at left 2-5 toes and ocassional pain to ball of foot L>R.  Admits history of Dermatomyositis since 11/2014 on long term steroids. Denies any other pedal complaints.   Patient Active Problem List   Diagnosis Date Noted  . Dermatomyositis ([place]) 01/07/2015  . Mitral regurgitation 01/19/2013  . Precordial pain 01/19/2013    Current Outpatient Prescriptions on File Prior to Visit  Medication Sig Dispense Refill  . ALPRAZolam (XANAX) 0.25 MG tablet take 1 tablet by mouth twice a day if needed for SEVERE ANXIETY  0  . azaTHIOprine (IMURAN) 50 MG tablet Take 150 mg by mouth daily.  0  . escitalopram (LEXAPRO) 10 MG tablet take 1 tablet by mouth every evening as directed  0  . HYDROcodone-acetaminophen (NORCO) 5-325 MG per tablet Take 1-2 tablets by mouth every 6 (six) hours as needed for moderate pain or severe pain. 30 tablet 0  . levothyroxine (SYNTHROID, LEVOTHROID) 75 MCG tablet Take 75 mcg by mouth daily.  0  . omeprazole (PRILOSEC) 40 MG capsule take 1 capsule by mouth once daily 20 MINUTES BEFORE BREAKFAST  1  . predniSONE (STERAPRED UNI-PAK) 5 MG TABS tablet Take 20 mg by mouth daily.    . RESTASIS 0.05 % ophthalmic emulsion 1 drop in both eyes twice a day  0  . Vitamin D, Ergocalciferol, (DRISDOL) 50000 UNITS CAPS capsule Take 50,000 Units by mouth every 7 (seven) days.    Marland Kitchen zolpidem (AMBIEN) 10 MG tablet   0   No current facility-administered medications on file prior to visit.     Allergies  Allergen Reactions  . Codeine Nausea And Vomiting  . Hydrocodone Nausea And Vomiting    Objective: Physical Exam General: The patient is alert and  oriented x3 in no acute distress.  Dermatology: Skin is warm, dry and supple bilateral lower extremities. Nails 1-10 are normal. There is no erythema, edema, no eccymosis, no open lesions present. Integument is otherwise unremarkable.  Vascular: Dorsalis Pedis pulse and Posterior Tibial pulse are 2/4 bilateral. Capillary fill time is immediate to all digits.  Neurological: Grossly intact to light touch with an achilles reflex of +2/5 and a negative Tinel's sign bilateral. Protective and vibratory intact bilateral. Subjective numbness to left 2-5 toes.   Musculoskeletal: Tenderness to palpation at the medial calcaneal tubercale and through the insertion of the plantar fascia on the left>right foot. No pain to ball or toes bilateral. No pain with compression of calcaneus bilateral. No pain with tuning fork to calcaneus bilateral. No pain with calf compression bilateral. There is decreased Ankle joint range of motion bilateral. All other joints range of motion within normal limits bilateral. Strength 5/5 in all groups bilateral.   Gait: Unassisted, Antalgic avoid weight on left heel  Xray, Right and Left foot:  Normal osseous mineralization. Joint spaces preserved. No fracture/dislocation/boney destruction. Calcaneal spur present with mild thickening of plantar fascia. No other soft tissue abnormalities or radiopaque foreign bodies.   Assessment and Plan: Problem List Items Addressed This Visit      Musculoskeletal and Integument   Dermatomyositis ([employer])    Other Visit Diagnoses    Pain of foot, unspecified laterality    -  Primary   L>R   Relevant Medications   diclofenac (VOLTAREN) 75 MG EC tablet   triamcinolone acetonide (KENALOG) 10 MG/ML injection 10 mg   Other Relevant Orders   DG Foot 2 Views Left   DG Foot 2 Views Right   Plantar fasciitis of left foot       Relevant Medications   diclofenac (VOLTAREN) 75 MG EC tablet   triamcinolone acetonide (KENALOG) 10 MG/ML injection 10 mg    Neuritis          -Complete examination performed.  -Xrays reviewed -Discussed with patient in detail the condition of plantar fasciitis, how this occurs and general treatment options. Explained both conservative and surgical treatments.  -After oral consent and aseptic prep, injected a mixture containing 1 ml of 2% plain lidocaine, 1 ml 0.5% plain marcaine, 0.5 ml of kenalog 10 and 0.5 ml of dexamethasone phosphate into left heel. Post-injection care discussed with patient.  -Rx Diclofenac to take as instructed no oral steriod since patient is on prednisone  -Recommended good supportive shoes and advised use of OTC insert. Explained to patient that if these orthoses work well, we will continue with these. If these do not improve her condition and  pain, we will consider custom molded orthoses. -No night splint or fascial brace at this visit; patient is afraid that if insurance does not pay she will not be able to afford it. -Explained and dispensed to patient daily stretching exercises. -Recommend patient to ice affected area 1-2x daily. -Patient to return to office in 4 weeks for follow up or sooner if problems or questions arise. May consider nerve test or neuropathy labs if neuritis is still present at next visit.  Landis Martins, DPM

## 2016-07-07 NOTE — Patient Instructions (Addendum)
Capsaicin cream to toes you can buy over the counter   Plantar Fasciitis With Rehab The plantar fascia is a fibrous, ligament-like, soft-tissue structure that spans the bottom of the foot. Plantar fasciitis, also called heel spur syndrome, is a condition that causes pain in the foot due to inflammation of the tissue. SYMPTOMS   Pain and tenderness on the underneath side of the foot.  Pain that worsens with standing or walking. CAUSES  Plantar fasciitis is caused by irritation and injury to the plantar fascia on the underneath side of the foot. Common mechanisms of injury include:  Direct trauma to bottom of the foot.  Damage to a small nerve that runs under the foot where the main fascia attaches to the heel bone.  Stress placed on the plantar fascia due to bone spurs. RISK INCREASES WITH:   Activities that place stress on the plantar fascia (running, jumping, pivoting, or cutting).  Poor strength and flexibility.  Improperly fitted shoes.  Tight calf muscles.  Flat feet.  Failure to warm-up properly before activity.  Obesity. PREVENTION  Warm up and stretch properly before activity.  Allow for adequate recovery between workouts.  Maintain physical fitness:  Strength, flexibility, and endurance.  Cardiovascular fitness.  Maintain a health body weight.  Avoid stress on the plantar fascia.  Wear properly fitted shoes, including arch supports for individuals who have flat feet. PROGNOSIS  If treated properly, then the symptoms of plantar fasciitis usually resolve without surgery. However, occasionally surgery is necessary. RELATED COMPLICATIONS   Recurrent symptoms that may result in a chronic condition.  Problems of the lower back that are caused by compensating for the injury, such as limping.  Pain or weakness of the foot during push-off following surgery.  Chronic inflammation, scarring, and partial or complete fascia tear, occurring more often from  repeated injections. TREATMENT  Treatment initially involves the use of ice and medication to help reduce pain and inflammation. The use of strengthening and stretching exercises may help reduce pain with activity, especially stretches of the Achilles tendon. These exercises may be performed at home or with a therapist. Your caregiver may recommend that you use heel cups of arch supports to help reduce stress on the plantar fascia. Occasionally, corticosteroid injections are given to reduce inflammation. If symptoms persist for greater than 6 months despite non-surgical (conservative), then surgery may be recommended.  MEDICATION   If pain medication is necessary, then nonsteroidal anti-inflammatory medications, such as aspirin and ibuprofen, or other minor pain relievers, such as acetaminophen, are often recommended.  Do not take pain medication within 7 days before surgery.  Prescription pain relievers may be given if deemed necessary by your caregiver. Use only as directed and only as much as you need.  Corticosteroid injections may be given by your caregiver. These injections should be reserved for the most serious cases, because they may only be given a certain number of times. HEAT AND COLD  Cold treatment (icing) relieves pain and reduces inflammation. Cold treatment should be applied for 10 to 15 minutes every 2 to 3 hours for inflammation and pain and immediately after any activity that aggravates your symptoms. Use ice packs or massage the area with a piece of ice (ice massage).  Heat treatment may be used prior to performing the stretching and strengthening activities prescribed by your caregiver, physical therapist, or athletic trainer. Use a heat pack or soak the injury in warm water. SEEK IMMEDIATE MEDICAL CARE IF:  Treatment seems to offer no  benefit, or the condition worsens.  Any medications produce adverse side effects. EXERCISES RANGE OF MOTION (ROM) AND STRETCHING EXERCISES  - Plantar Fasciitis (Heel Spur Syndrome) These exercises may help you when beginning to rehabilitate your injury. Your symptoms may resolve with or without further involvement from your physician, physical therapist or athletic trainer. While completing these exercises, remember:   Restoring tissue flexibility helps normal motion to return to the joints. This allows healthier, less painful movement and activity.  An effective stretch should be held for at least 30 seconds.  A stretch should never be painful. You should only feel a gentle lengthening or release in the stretched tissue. RANGE OF MOTION - Toe Extension, Flexion  Sit with your right / left leg crossed over your opposite knee.  Grasp your toes and gently pull them back toward the top of your foot. You should feel a stretch on the bottom of your toes and/or foot.  Hold this stretch for __________ seconds.  Now, gently pull your toes toward the bottom of your foot. You should feel a stretch on the top of your toes and or foot.  Hold this stretch for __________ seconds. Repeat __________ times. Complete this stretch __________ times per day.  RANGE OF MOTION - Ankle Dorsiflexion, Active Assisted  Remove shoes and sit on a chair that is preferably not on a carpeted surface.  Place right / left foot under knee. Extend your opposite leg for support.  Keeping your heel down, slide your right / left foot back toward the chair until you feel a stretch at your ankle or calf. If you do not feel a stretch, slide your bottom forward to the edge of the chair, while still keeping your heel down.  Hold this stretch for __________ seconds. Repeat __________ times. Complete this stretch __________ times per day.  STRETCH - Gastroc, Standing  Place hands on wall.  Extend right / left leg, keeping the front knee somewhat bent.  Slightly point your toes inward on your back foot.  Keeping your right / left heel on the floor and your knee  straight, shift your weight toward the wall, not allowing your back to arch.  You should feel a gentle stretch in the right / left calf. Hold this position for __________ seconds. Repeat __________ times. Complete this stretch __________ times per day. STRETCH - Soleus, Standing  Place hands on wall.  Extend right / left leg, keeping the other knee somewhat bent.  Slightly point your toes inward on your back foot.  Keep your right / left heel on the floor, bend your back knee, and slightly shift your weight over the back leg so that you feel a gentle stretch deep in your back calf.  Hold this position for __________ seconds. Repeat __________ times. Complete this stretch __________ times per day. STRETCH - Gastrocsoleus, Standing  Note: This exercise can place a lot of stress on your foot and ankle. Please complete this exercise only if specifically instructed by your caregiver.   Place the ball of your right / left foot on a step, keeping your other foot firmly on the same step.  Hold on to the wall or a rail for balance.  Slowly lift your other foot, allowing your body weight to press your heel down over the edge of the step.  You should feel a stretch in your right / left calf.  Hold this position for __________ seconds.  Repeat this exercise with a slight bend in your  right / left knee. Repeat __________ times. Complete this stretch __________ times per day.  STRENGTHENING EXERCISES - Plantar Fasciitis (Heel Spur Syndrome)  These exercises may help you when beginning to rehabilitate your injury. They may resolve your symptoms with or without further involvement from your physician, physical therapist or athletic trainer. While completing these exercises, remember:   Muscles can gain both the endurance and the strength needed for everyday activities through controlled exercises.  Complete these exercises as instructed by your physician, physical therapist or athletic trainer.  Progress the resistance and repetitions only as guided. STRENGTH - Towel Curls  Sit in a chair positioned on a non-carpeted surface.  Place your foot on a towel, keeping your heel on the floor.  Pull the towel toward your heel by only curling your toes. Keep your heel on the floor.  If instructed by your physician, physical therapist or athletic trainer, add ____________________ at the end of the towel. Repeat __________ times. Complete this exercise __________ times per day. STRENGTH - Ankle Inversion  Secure one end of a rubber exercise band/tubing to a fixed object (table, pole). Loop the other end around your foot just before your toes.  Place your fists between your knees. This will focus your strengthening at your ankle.  Slowly, pull your big toe up and in, making sure the band/tubing is positioned to resist the entire motion.  Hold this position for __________ seconds.  Have your muscles resist the band/tubing as it slowly pulls your foot back to the starting position. Repeat __________ times. Complete this exercises __________ times per day.    This information is not intended to replace advice given to you by your health care provider. Make sure you discuss any questions you have with your health care provider.   Document Released: 09/07/2005 Document Revised: 01/22/2015 Document Reviewed: 12/20/2008 [employer] Interactive Patient Education 2016 [employer] Plantar Fasciitis With Rehab The plantar fascia is a fibrous, ligament-like, soft-tissue structure that spans the bottom of the foot. Plantar fasciitis, also called heel spur syndrome, is a condition that causes pain in the foot due to inflammation of the tissue. SYMPTOMS   Pain and tenderness on the underneath side of the foot.  Pain that worsens with standing or walking. CAUSES  Plantar fasciitis is caused by irritation and injury to the plantar fascia on the underneath side of the foot. Common mechanisms of injury  include:  Direct trauma to bottom of the foot.  Damage to a small nerve that runs under the foot where the main fascia attaches to the heel bone.  Stress placed on the plantar fascia due to bone spurs. RISK INCREASES WITH:   Activities that place stress on the plantar fascia (running, jumping, pivoting, or cutting).  Poor strength and flexibility.  Improperly fitted shoes.  Tight calf muscles.  Flat feet.  Failure to warm-up properly before activity.  Obesity. PREVENTION  Warm up and stretch properly before activity.  Allow for adequate recovery between workouts.  Maintain physical fitness:  Strength, flexibility, and endurance.  Cardiovascular fitness.  Maintain a health body weight.  Avoid stress on the plantar fascia.  Wear properly fitted shoes, including arch supports for individuals who have flat feet. PROGNOSIS  If treated properly, then the symptoms of plantar fasciitis usually resolve without surgery. However, occasionally surgery is necessary. RELATED COMPLICATIONS   Recurrent symptoms that may result in a chronic condition.  Problems of the lower back that are caused by compensating for the injury, such as limping.  Pain or weakness of the foot during push-off following surgery.  Chronic inflammation, scarring, and partial or complete fascia tear, occurring more often from repeated injections. TREATMENT  Treatment initially involves the use of ice and medication to help reduce pain and inflammation. The use of strengthening and stretching exercises may help reduce pain with activity, especially stretches of the Achilles tendon. These exercises may be performed at home or with a therapist. Your caregiver may recommend that you use heel cups of arch supports to help reduce stress on the plantar fascia. Occasionally, corticosteroid injections are given to reduce inflammation. If symptoms persist for greater than 6 months despite non-surgical (conservative),  then surgery may be recommended.  MEDICATION   If pain medication is necessary, then nonsteroidal anti-inflammatory medications, such as aspirin and ibuprofen, or other minor pain relievers, such as acetaminophen, are often recommended.  Do not take pain medication within 7 days before surgery.  Prescription pain relievers may be given if deemed necessary by your caregiver. Use only as directed and only as much as you need.  Corticosteroid injections may be given by your caregiver. These injections should be reserved for the most serious cases, because they may only be given a certain number of times. HEAT AND COLD  Cold treatment (icing) relieves pain and reduces inflammation. Cold treatment should be applied for 10 to 15 minutes every 2 to 3 hours for inflammation and pain and immediately after any activity that aggravates your symptoms. Use ice packs or massage the area with a piece of ice (ice massage).  Heat treatment may be used prior to performing the stretching and strengthening activities prescribed by your caregiver, physical therapist, or athletic trainer. Use a heat pack or soak the injury in warm water. SEEK IMMEDIATE MEDICAL CARE IF:  Treatment seems to offer no benefit, or the condition worsens.  Any medications produce adverse side effects. EXERCISES RANGE OF MOTION (ROM) AND STRETCHING EXERCISES - Plantar Fasciitis (Heel Spur Syndrome) These exercises may help you when beginning to rehabilitate your injury. Your symptoms may resolve with or without further involvement from your physician, physical therapist or athletic trainer. While completing these exercises, remember:   Restoring tissue flexibility helps normal motion to return to the joints. This allows healthier, less painful movement and activity.  An effective stretch should be held for at least 30 seconds.  A stretch should never be painful. You should only feel a gentle lengthening or release in the stretched  tissue. RANGE OF MOTION - Toe Extension, Flexion  Sit with your right / left leg crossed over your opposite knee.  Grasp your toes and gently pull them back toward the top of your foot. You should feel a stretch on the bottom of your toes and/or foot.  Hold this stretch for __________ seconds.  Now, gently pull your toes toward the bottom of your foot. You should feel a stretch on the top of your toes and or foot.  Hold this stretch for __________ seconds. Repeat __________ times. Complete this stretch __________ times per day.  RANGE OF MOTION - Ankle Dorsiflexion, Active Assisted  Remove shoes and sit on a chair that is preferably not on a carpeted surface.  Place right / left foot under knee. Extend your opposite leg for support.  Keeping your heel down, slide your right / left foot back toward the chair until you feel a stretch at your ankle or calf. If you do not feel a stretch, slide your bottom forward to the edge  of the chair, while still keeping your heel down.  Hold this stretch for __________ seconds. Repeat __________ times. Complete this stretch __________ times per day.  STRETCH - Gastroc, Standing  Place hands on wall.  Extend right / left leg, keeping the front knee somewhat bent.  Slightly point your toes inward on your back foot.  Keeping your right / left heel on the floor and your knee straight, shift your weight toward the wall, not allowing your back to arch.  You should feel a gentle stretch in the right / left calf. Hold this position for __________ seconds. Repeat __________ times. Complete this stretch __________ times per day. STRETCH - Soleus, Standing  Place hands on wall.  Extend right / left leg, keeping the other knee somewhat bent.  Slightly point your toes inward on your back foot.  Keep your right / left heel on the floor, bend your back knee, and slightly shift your weight over the back leg so that you feel a gentle stretch deep in your  back calf.  Hold this position for __________ seconds. Repeat __________ times. Complete this stretch __________ times per day. STRETCH - Gastrocsoleus, Standing  Note: This exercise can place a lot of stress on your foot and ankle. Please complete this exercise only if specifically instructed by your caregiver.   Place the ball of your right / left foot on a step, keeping your other foot firmly on the same step.  Hold on to the wall or a rail for balance.  Slowly lift your other foot, allowing your body weight to press your heel down over the edge of the step.  You should feel a stretch in your right / left calf.  Hold this position for __________ seconds.  Repeat this exercise with a slight bend in your right / left knee. Repeat __________ times. Complete this stretch __________ times per day.  STRENGTHENING EXERCISES - Plantar Fasciitis (Heel Spur Syndrome)  These exercises may help you when beginning to rehabilitate your injury. They may resolve your symptoms with or without further involvement from your physician, physical therapist or athletic trainer. While completing these exercises, remember:   Muscles can gain both the endurance and the strength needed for everyday activities through controlled exercises.  Complete these exercises as instructed by your physician, physical therapist or athletic trainer. Progress the resistance and repetitions only as guided. STRENGTH - Towel Curls  Sit in a chair positioned on a non-carpeted surface.  Place your foot on a towel, keeping your heel on the floor.  Pull the towel toward your heel by only curling your toes. Keep your heel on the floor.  If instructed by your physician, physical therapist or athletic trainer, add ____________________ at the end of the towel. Repeat __________ times. Complete this exercise __________ times per day. STRENGTH - Ankle Inversion  Secure one end of a rubber exercise band/tubing to a fixed object  (table, pole). Loop the other end around your foot just before your toes.  Place your fists between your knees. This will focus your strengthening at your ankle.  Slowly, pull your big toe up and in, making sure the band/tubing is positioned to resist the entire motion.  Hold this position for __________ seconds.  Have your muscles resist the band/tubing as it slowly pulls your foot back to the starting position. Repeat __________ times. Complete this exercises __________ times per day.    This information is not intended to replace advice given to you by your  health care provider. Make sure you discuss any questions you have with your health care provider.   Document Released: 09/07/2005 Document Revised: 01/22/2015 Document Reviewed: 12/20/2008 Elsevier Interactive Patient Education [employer].

## 2016-08-04 ENCOUNTER — Ambulatory Visit (INDEPENDENT_AMBULATORY_CARE_PROVIDER_SITE_OTHER): Payer: BLUE CROSS/BLUE SHIELD | Admitting: Sports Medicine

## 2016-08-04 ENCOUNTER — Encounter: Payer: Self-pay | Admitting: Sports Medicine

## 2016-08-04 DIAGNOSIS — M722 Plantar fascial fibromatosis: Secondary | ICD-10-CM

## 2016-08-04 DIAGNOSIS — M79673 Pain in unspecified foot: Secondary | ICD-10-CM

## 2016-08-04 DIAGNOSIS — M792 Neuralgia and neuritis, unspecified: Secondary | ICD-10-CM

## 2016-08-04 NOTE — Progress Notes (Signed)
Subjective: Carmen Woods is a 60 y.o. female patient returns for follow up of L>R heel pain, states that there is no pain in heel. States that sometimes she gets pain in ball that is better after change of shoes. No longer has any numbness to toes. States had to stop Diclofenac because of gastritic issues as per PCP recs. Patient denies any other symptoms.  Patient Active Problem List   Diagnosis Date Noted  . Dermatomyositis ([place]) 01/07/2015  . Mitral regurgitation 01/19/2013  . Precordial pain 01/19/2013    Current Outpatient Prescriptions on File Prior to Visit  Medication Sig Dispense Refill  . ALPRAZolam (XANAX) 0.25 MG tablet take 1 tablet by mouth twice a day if needed for SEVERE ANXIETY  0  . azaTHIOprine (IMURAN) 50 MG tablet Take 150 mg by mouth daily.  0  . diclofenac (VOLTAREN) 75 MG EC tablet Take 1 tablet (75 mg total) by mouth 2 (two) times daily. 60 tablet 0  . escitalopram (LEXAPRO) 10 MG tablet take 1 tablet by mouth every evening as directed  0  . HYDROcodone-acetaminophen (NORCO) 5-325 MG per tablet Take 1-2 tablets by mouth every 6 (six) hours as needed for moderate pain or severe pain. 30 tablet 0  . levothyroxine (SYNTHROID, LEVOTHROID) 75 MCG tablet Take 75 mcg by mouth daily.  0  . omeprazole (PRILOSEC) 40 MG capsule take 1 capsule by mouth once daily 20 MINUTES BEFORE BREAKFAST  1  . predniSONE (STERAPRED UNI-PAK) 5 MG TABS tablet Take 20 mg by mouth daily.    . RESTASIS 0.05 % ophthalmic emulsion 1 drop in both eyes twice a day  0  . Vitamin D, Ergocalciferol, (DRISDOL) 50000 UNITS CAPS capsule Take 50,000 Units by mouth every 7 (seven) days.    Marland Kitchen zolpidem (AMBIEN) 10 MG tablet   0   Current Facility-Administered Medications on File Prior to Visit  Medication Dose Route Frequency Provider Last Rate Last Dose  . triamcinolone acetonide (KENALOG) 10 MG/ML injection 10 mg  10 mg Other Once Landis Martins, DPM        Allergies  Allergen Reactions  .  Codeine Nausea And Vomiting  . Hydrocodone Nausea And Vomiting    Objective: Physical Exam General: The patient is alert and oriented x3 in no acute distress.  Dermatology: Skin is warm, dry and supple bilateral lower extremities. Nails 1-10 are normal. There is no erythema, edema, no eccymosis, no open lesions present. Integument is otherwise unremarkable.  Vascular: Dorsalis Pedis pulse and Posterior Tibial pulse are 2/4 bilateral. Capillary fill time is immediate to all digits.  Neurological: Grossly intact to light touch with an achilles reflex of +2/5 and a negative Tinel's sign bilateral. Protective and vibratory intact bilateral. Subjective numbness to left 2-5 toes.   Musculoskeletal: No tenderness to palpation at the medial calcaneal tubercale and through the insertion of the plantar fascia bilateral. No pain to ball or toes bilateral however sometimes patient states it does hurt here but no after treatment. No pain with compression of calcaneus bilateral. No pain with tuning fork to calcaneus bilateral. No pain with calf compression bilateral. There is decreased Ankle joint range of motion bilateral. All other joints range of motion within normal limits bilateral. Strength 5/5 in all groups bilateral.    Assessment and Plan: Problem List Items Addressed This Visit    None    Visit Diagnoses    Plantar fasciitis of left foot    -  Primary   resolved  Neuritis       much improved   Pain of foot, unspecified laterality          -Complete examination performed.  -Discussed long term care of fasciitis and neuritis -No left heel reinjection since symptoms are resolved  -Diclofenac discontinued after 2.5 weeks secondary to gastritis -Continue with good supportive shoes and OTC inserts -Encouraged patient to do daily stretching exercises. -Recommend continue to ice as needed -Patient to return to office as needed  Landis Martins, DPM

## 2016-08-18 ENCOUNTER — Other Ambulatory Visit: Payer: Self-pay | Admitting: Internal Medicine

## 2016-08-18 DIAGNOSIS — Z1231 Encounter for screening mammogram for malignant neoplasm of breast: Secondary | ICD-10-CM

## 2016-08-19 ENCOUNTER — Ambulatory Visit
Admission: RE | Admit: 2016-08-19 | Discharge: 2016-08-19 | Disposition: A | Discharge status: Home / Self Care | Payer: Self-pay | Source: Ambulatory Visit | Attending: Internal Medicine | Admitting: Internal Medicine

## 2016-08-19 DIAGNOSIS — Z1231 Encounter for screening mammogram for malignant neoplasm of breast: Secondary | ICD-10-CM

## 2016-09-10 ENCOUNTER — Other Ambulatory Visit: Payer: Self-pay | Admitting: Rheumatology

## 2016-09-10 NOTE — Telephone Encounter (Signed)
Okay to refill prednisone.

## 2016-09-10 NOTE — Telephone Encounter (Signed)
Last Visit: 07/07/16 Next Visit: 10/30/16 Labs: 05/01/16 WNL  Okay to refill Prednisone?

## 2016-09-17 ENCOUNTER — Other Ambulatory Visit: Payer: Self-pay | Admitting: *Deleted

## 2016-09-17 DIAGNOSIS — Z79899 Other long term (current) drug therapy: Secondary | ICD-10-CM

## 2016-09-17 DIAGNOSIS — M79641 Pain in right hand: Secondary | ICD-10-CM

## 2016-09-17 DIAGNOSIS — M79642 Pain in left hand: Secondary | ICD-10-CM

## 2016-09-17 LAB — CBC WITH DIFFERENTIAL/PLATELET
BASOS ABS: 52 {cells}/uL (ref 0–200)
BASOS PCT: 1 %
EOS ABS: 104 {cells}/uL (ref 15–500)
Eosinophils Relative: 2 %
HEMATOCRIT: 38.2 % (ref 35.0–45.0)
Hemoglobin: 12.7 g/dL (ref 11.7–15.5)
LYMPHS PCT: 15 %
Lymphs Abs: 780 cells/uL — ABNORMAL LOW (ref 850–3900)
MCH: 29.5 pg (ref 27.0–33.0)
MCHC: 33.2 g/dL (ref 32.0–36.0)
MCV: 88.6 fL (ref 80.0–100.0)
MONO ABS: 364 {cells}/uL (ref 200–950)
MONOS PCT: 7 %
MPV: 9.5 fL (ref 7.5–12.5)
NEUTROS ABS: 3900 {cells}/uL (ref 1500–7800)
Neutrophils Relative %: 75 %
PLATELETS: 260 10*3/uL (ref 140–400)
RBC: 4.31 MIL/uL (ref 3.80–5.10)
RDW: 13.1 % (ref 11.0–15.0)
WBC: 5.2 10*3/uL (ref 3.8–10.8)

## 2016-09-18 LAB — COMPLETE METABOLIC PANEL WITH GFR
ALK PHOS: 73 U/L (ref 33–130)
ALT: 14 U/L (ref 6–29)
AST: 24 U/L (ref 10–35)
Albumin: 4.4 g/dL (ref 3.6–5.1)
BILIRUBIN TOTAL: 0.7 mg/dL (ref 0.2–1.2)
BUN: 8 mg/dL (ref 7–25)
CO2: 23 mmol/L (ref 20–31)
CREATININE: 0.6 mg/dL (ref 0.50–0.99)
Calcium: 9.7 mg/dL (ref 8.6–10.4)
Chloride: 106 mmol/L (ref 98–110)
Glucose, Bld: 104 mg/dL — ABNORMAL HIGH (ref 65–99)
Potassium: 4 mmol/L (ref 3.5–5.3)
Sodium: 143 mmol/L (ref 135–146)
TOTAL PROTEIN: 6.7 g/dL (ref 6.1–8.1)

## 2016-09-18 LAB — CK: Total CK: 133 U/L (ref 7–177)

## 2016-09-20 NOTE — Progress Notes (Signed)
Labs normal.

## 2016-09-22 ENCOUNTER — Telehealth: Payer: Self-pay | Admitting: Radiology

## 2016-09-22 NOTE — Telephone Encounter (Signed)
I have called patient to advise labs are normal  

## 2016-09-22 NOTE — Telephone Encounter (Signed)
-----   Message from Bo Merino, MD sent at 09/20/2016  7:27 PM EST ----- Labs normal

## 2016-11-09 ENCOUNTER — Ambulatory Visit: Payer: BLUE CROSS/BLUE SHIELD | Admitting: Rheumatology

## 2016-11-10 DIAGNOSIS — M47816 Spondylosis without myelopathy or radiculopathy, lumbar region: Secondary | ICD-10-CM | POA: Insufficient documentation

## 2016-11-10 DIAGNOSIS — M3501 Sicca syndrome with keratoconjunctivitis: Secondary | ICD-10-CM | POA: Insufficient documentation

## 2016-11-10 DIAGNOSIS — Z87898 Personal history of other specified conditions: Secondary | ICD-10-CM | POA: Insufficient documentation

## 2016-11-10 DIAGNOSIS — Z79899 Other long term (current) drug therapy: Secondary | ICD-10-CM | POA: Insufficient documentation

## 2016-11-10 DIAGNOSIS — Z8659 Personal history of other mental and behavioral disorders: Secondary | ICD-10-CM | POA: Insufficient documentation

## 2016-11-10 DIAGNOSIS — Z8639 Personal history of other endocrine, nutritional and metabolic disease: Secondary | ICD-10-CM | POA: Insufficient documentation

## 2016-11-10 DIAGNOSIS — E559 Vitamin D deficiency, unspecified: Secondary | ICD-10-CM | POA: Insufficient documentation

## 2016-11-10 DIAGNOSIS — R5383 Other fatigue: Secondary | ICD-10-CM | POA: Insufficient documentation

## 2016-11-10 DIAGNOSIS — R238 Other skin changes: Secondary | ICD-10-CM | POA: Insufficient documentation

## 2016-11-10 NOTE — Progress Notes (Signed)
Office Visit Note  Patient: Carmen Woods             Date of Birth: [birth year]           MRN: [id number]             PCP: Glenda Chroman, MD Referring: Glenda Chroman, MD Visit Date: 11/19/2016 Occupation: '@GUAROCC' @    Subjective:  Pain in multiple joints.  History of Present Illness: [name] is a 61 y.o. female with history of dermatomyositis and osteoarthritis. According to her she's been having increased pain and stiffness in her hands in the morning. She's been also having increased pain in her bilateral feet especially her left foot. She went to see a podiatrist who gave her cortisone injection for plantar fasciitis. She was also told that she has a heel spur. The cortisone injection lasted for short time and now the symptoms have recurred. She's been having increased pain in her right hip which is been popping and the pain radiates to her right groin. She continues to have pain and discomfort in her bilateral knee joints. Her Imuran dose was decreased few months back as she was doing better and chest prednisone was tapered down. She is currently on 100 mg of Imuran and 2 mg of prednisone.  Activities of Daily Living:  Patient reports morning stiffness for 4 hours.   Patient Reports nocturnal pain.  Difficulty dressing/grooming: Denies Difficulty climbing stairs: Reports Difficulty getting out of chair: Reports Difficulty using hands for taps, buttons, cutlery, and/or writing: Reports   Review of Systems  Constitutional: Positive for fatigue. Negative for night sweats, weight gain, weight loss and weakness.  HENT: Positive for mouth dryness. Negative for mouth sores, trouble swallowing, trouble swallowing and nose dryness.   Eyes: Positive for dryness. Negative for pain, redness and visual disturbance.  Respiratory: Negative for cough, shortness of breath and difficulty breathing.   Cardiovascular: Negative for chest pain, palpitations, hypertension, irregular  heartbeat and swelling in legs/feet.  Gastrointestinal: Negative for blood in stool, constipation and diarrhea.  Endocrine: Negative for increased urination.  Genitourinary: Negative for vaginal dryness.  Musculoskeletal: Positive for arthralgias, joint pain, joint swelling, myalgias, morning stiffness and myalgias. Negative for muscle weakness and muscle tenderness.  Skin: Negative for color change, rash, hair loss, skin tightness, ulcers and sensitivity to sunlight.  Allergic/Immunologic: Negative for susceptible to infections.  Neurological: Negative for dizziness, memory loss and night sweats.  Hematological: Negative for swollen glands.  Psychiatric/Behavioral: Positive for sleep disturbance. Negative for depressed mood. The patient is not nervous/anxious.     PMFS History:  Patient Active Problem List   Diagnosis Date Noted  . High risk medication use 11/10/2016  . Galtron's Papules 11/10/2016  . Other fatigue 11/10/2016  . Spondylosis of lumbar region without myelopathy or radiculopathy 11/10/2016  . History of anxiety 11/10/2016  . Vitamin D deficiency 11/10/2016  . History of vertigo 11/10/2016  . Sjogren's syndrome with keratoconjunctivitis sicca ([place]) 11/10/2016  . History of hypothyroidism 11/10/2016  . Dermatomyositis ([place]) 01/07/2015  . Mitral regurgitation 01/19/2013  . Precordial pain 01/19/2013    Past Medical History:  Diagnosis Date  . DDD (degenerative disc disease), lumbar   . Dermatomyositis ([place]) 12/2014  . Difficulty swallowing   . Dry eyes   . Dry mouth   . Hypothyroidism   . Mass of left thigh 12/2014  . Rash    hands, left thigh    Family History  Problem Relation Age of  Onset  . Diabetes Father   . Kidney disease Sister 26  . Heart Problems Mother     [place]  . Breast cancer Mother    Past Surgical History:  Procedure Laterality Date  . BACK SURGERY  1998  . BELPHAROPTOSIS REPAIR Bilateral 11/2014  . MUSCLE BIOPSY Left 01/07/2015    Procedure: LEFT THIGH MUSCLE BIOPSY;  Surgeon: Fanny Skates, MD;  Location: [facility];  Service: General;  Laterality: Left;   Social History   Social History Narrative  . No narrative on file     Objective: Vital Signs: BP 126/64   Pulse 68   Resp 14   Ht '5\' 8"'  (1.727 m)   Wt 183 lb (83 kg)   BMI 27.83 kg/m    Physical Exam  Constitutional: She is oriented to person, place, and time. She appears well-developed and well-nourished.  HENT:  Head: Normocephalic and atraumatic.  Eyes: Conjunctivae and EOM are normal.  Neck: Normal range of motion.  Cardiovascular: Normal rate, regular rhythm, normal heart sounds and intact distal pulses.   Pulmonary/Chest: Effort normal and breath sounds normal.  Abdominal: Soft. Bowel sounds are normal.  Lymphadenopathy:    She has no cervical adenopathy.  Neurological: She is alert and oriented to person, place, and time.  Skin: Skin is warm and dry. Capillary refill takes less than 2 seconds. There is erythema.  Erythema noted over her MCPs and PIPs on dorsal aspect consistent with Gottron's papules.  Psychiatric: She has a normal mood and affect. Her behavior is normal.  Nursing note and vitals reviewed.    Musculoskeletal Exam: C-spine and thoracic lumbar spine good range of motion. Shoulder joints elbow joints with good range of motion. She has tenderness across her MCPs and PIP joints with no synovitis. She has painful range of motion of her right hip joint which was limited. She is warmth in her right knee joint discomfort range of motion of bilateral knee joints. She has tenderness across bilateral MTP joints.  CDAI Exam: No CDAI exam completed.    Investigation: Findings:  11/2014 The report on the skin biopsy shows interface dermatitis with basement membrane changes.  Could be consistent with systemic lupus, collagen vascular disease or dermatomyositis.  2.  She had labs from February 2016, which show ANA was  positive but no titer was given.  She had positive Ro antibody, positive antichromatin antibody.   CBC was normal.  Comprehensive metabolic panel showed AST 69 and ALT 49.  Sed rate was normal.     March 2016:  CT, abdomen and pelvis and chest was normal.     March 2016:  Comprehensive metabolic panel showed alkaline phosphatase 171, AST 73, ALT 59, CK was 262 with 98% muscle portion.  Aldolase 13.8.  Sed rate was normal.  LDH was 308.   March 2016:  CBC and comprehensive metabolic panel were normal.  CK was normal at 70.  I also got her initial labs, which were done through Dr. Woody Seller' office.  Her lupus anticoagulant was negative.  Anticardiolipin IgM was borderline positive at 20 and beta-2 IgM was borderline positive at 14.  Her hep panel and immunoglobulins were normal.  TSH was elevated at 10.17.  Vitamin D was low at 27.  TPMT was normal.  TB Gold was negative.  SPEP was negative.  Complements were normal.  ANA was positive but no titer was given.  G6PD was normal.  CCP was normal.  Myositis panel was positive for  RO-52.  The PM-Scl and OJ were negative  09/17/2016 CBC normal, CMP normal, CK 133     Orders Only on 09/17/2016  Component Date Value Ref Range Status  . WBC 09/17/2016 5.2  3.8 - 10.8 K/uL Final  . RBC 09/17/2016 4.31  3.80 - 5.10 MIL/uL Final  . Hemoglobin 09/17/2016 12.7  11.7 - 15.5 g/dL Final  . HCT 09/17/2016 38.2  35.0 - 45.0 % Final  . MCV 09/17/2016 88.6  80.0 - 100.0 fL Final  . MCH 09/17/2016 29.5  27.0 - 33.0 pg Final  . MCHC 09/17/2016 33.2  32.0 - 36.0 g/dL Final  . RDW 09/17/2016 13.1  11.0 - 15.0 % Final  . Platelets 09/17/2016 260  140 - 400 K/uL Final  . MPV 09/17/2016 9.5  7.5 - 12.5 fL Final  . Neutro Abs 09/17/2016 3900  1,500 - 7,800 cells/uL Final  . Lymphs Abs 09/17/2016 780* 850 - 3,900 cells/uL Final  . Monocytes Absolute 09/17/2016 364  200 - 950 cells/uL Final  . Eosinophils Absolute 09/17/2016 104  15 - 500 cells/uL Final  . Basophils Absolute  09/17/2016 52  0 - 200 cells/uL Final  . Neutrophils Relative % 09/17/2016 75  % Final  . Lymphocytes Relative 09/17/2016 15  % Final  . Monocytes Relative 09/17/2016 7  % Final  . Eosinophils Relative 09/17/2016 2  % Final  . Basophils Relative 09/17/2016 1  % Final  . Smear Review 09/17/2016 Criteria for review not met   Final  . Sodium 09/17/2016 143  135 - 146 mmol/L Final  . Potassium 09/17/2016 4.0  3.5 - 5.3 mmol/L Final  . Chloride 09/17/2016 106  98 - 110 mmol/L Final  . CO2 09/17/2016 23  20 - 31 mmol/L Final  . Glucose, Bld 09/17/2016 104* 65 - 99 mg/dL Final  . BUN 09/17/2016 8  7 - 25 mg/dL Final  . Creat 09/17/2016 0.60  0.50 - 0.99 mg/dL Final   Comment:   For patients > or = 61 years of age: The upper reference limit for Creatinine is approximately 13% higher for people identified as African-American.     . Total Bilirubin 09/17/2016 0.7  0.2 - 1.2 mg/dL Final  . Alkaline Phosphatase 09/17/2016 73  33 - 130 U/L Final  . AST 09/17/2016 24  10 - 35 U/L Final  . ALT 09/17/2016 14  6 - 29 U/L Final  . Total Protein 09/17/2016 6.7  6.1 - 8.1 g/dL Final  . Albumin 09/17/2016 4.4  3.6 - 5.1 g/dL Final  . Calcium 09/17/2016 9.7  8.6 - 10.4 mg/dL Final  . GFR, Est African American 09/17/2016 >89  >=60 mL/min Final  . GFR, Est Non African American 09/17/2016 >89  >=60 mL/min Final  . Total CK 09/17/2016 133  7 - 177 U/L Final      Imaging: Xr Hip Unilat W Or W/o Pelvis 2-3 Views Right  Result Date: 11/19/2016 No hip joint narrowing was noted no chondrocalcinosis. Impression: Normal x-ray of the hip joint  Xr Foot 2 Views Left  Result Date: 11/19/2016 Posterior inferior calcaneal spur was noted. First MTP narrowing was noted. No erosive changes were noted and no narrowing of other MTPs was noted. Impression these findings are consistent with osteoarthritis and calcaneal spurs  Xr Foot 2 Views Right  Result Date: 11/19/2016 Posterior inferior calcaneal spur was noted.  First MTP narrowing was noted. No erosive changes were noted and no narrowing of other MTPs was noted.  Impression these findings are consistent with osteoarthritis and calcaneal spurs  Xr Hand 2 View Left  Result Date: 11/19/2016 No MCP narrowing or erosive changes were noted. She's minimal PIP narrowing. No intercarpal joint space narrowing was noted. No chondrocalcinosis was noted. Impression: These findings were consistent with early osteoarthritis   Xr Hand 2 View Right  Result Date: 11/19/2016 No MCP narrowing or erosive changes were noted. She's minimal PIP narrowing. No intercarpal joint space narrowing was noted. No chondrocalcinosis was noted. Impression: These findings were consistent with early osteoarthritis   Xr Knee 3 View Left  Result Date: 11/19/2016 Moderate medial compartment narrowing. No chondrocalcinosis. Moderate patellofemoral narrowing. Impression: Moderate osteoarthritis of the knee joint and moderate chondromalacia patella  Xr Knee 3 View Right  Result Date: 11/19/2016 Moderate medial compartment narrowing. No chondrocalcinosis. Moderate patellofemoral narrowing. Impression: Moderate osteoarthritis of the knee joint and moderate chondromalacia patell   Speciality Comments: No specialty comments available.    Procedures:  No procedures performed Allergies: Codeine and Hydrocodone   Assessment / Plan:     Visit Diagnoses: Dermatomyositis ([place]) - Positive biopsy May 2016, Ro 52 positive, elevated CK and aldolase, Gottron's papules, muscle weakness, fatigue, positive Ro, positive anti-chromatin. She appears to be having a flare with increased pain and swelling in her joints. She believes her rash is worse as well. We'll decrease her Imuran in the past due to sores in her mouth. She was treated increase the dose of Imuran again. Indications side effects were reviewed. I've increase her Imuran 250 minute grams by mouth daily. In the meantime I'll increase her prednisone to  5 mg by mouth daily for a month and then she will taper by 1 mg every month until she reaches 2 mg by mouth daily to monitor the disease processes I will check following labs - Plan: CK, Sedimentation rate  High risk medication use - Imuran 100 mg by mouth daily, prednisone 2 mg by mouth daily - Plan: CBC with Differential/Platelet, COMPLETE METABOLIC PANEL WITH GFR, CBC with Differential/Platelet, COMPLETE METABOLIC PANEL WITH GFR  Sjogren's syndrome with keratoconjunctivitis sicca ([facility]) : Over-the-counter products were discussed.  Spondylosis of lumbar region without myelopathy or radiculopathy - Status post laminectomy: She has chronic pain  History of anxiety  Vitamin D deficiency  History of vertigo  History of hypothyroidism  Pain in both hands - Plan: XR Hand 2 View Right, XR Hand 2 View Left. X-rays consistent with only osteoarthritis. No erosive changes were noted.  Pain in both feet - Plan: XR Foot 2 Views Right, XR Foot 2 Views Left. X-rays consistent with osteoarthritis and calcaneal spurs.  Chronic pain of both knees - Plan: XR KNEE 3 VIEW RIGHT, XR KNEE 3 VIEW LEFT: X-ray show moderate osteoarthritis. Weight loss diet and exercise was discussed. I believe some of the discomfort is coming from  flare of her autoimmune disease.  Pain in right hip - Plan: XR HIP UNILAT W OR W/O PELVIS 2-3 VIEWS RIGHT . I do not see any significant narrowing in the hip joint.   Orders: Orders Placed This Encounter  Procedures  . XR KNEE 3 VIEW RIGHT  . XR KNEE 3 VIEW LEFT  . XR Foot 2 Views Right  . XR Foot 2 Views Left  . XR HIP UNILAT W OR W/O PELVIS 2-3 VIEWS RIGHT  . XR Hand 2 View Right  . XR Hand 2 View Left  . CBC with Differential/Platelet  . COMPLETE METABOLIC PANEL WITH GFR  .  CK  . Sedimentation rate   No orders of the defined types were placed in this encounter.   Face-to-face time spent with patient was 40 minutes. 50% of time was spent in counseling and coordination  of care.  Follow-Up Instructions: Return in about 4 months (around 03/21/2017) for Dermatomyositis, Osteoarthritis.   Bo Merino, MD  Note - This record has been created using Editor, commissioning.  Chart creation errors have been sought, but may not always  have been located. Such creation errors do not reflect on  the standard of medical care.

## 2016-11-19 ENCOUNTER — Encounter: Payer: Self-pay | Admitting: Rheumatology

## 2016-11-19 ENCOUNTER — Ambulatory Visit (INDEPENDENT_AMBULATORY_CARE_PROVIDER_SITE_OTHER): Payer: Self-pay

## 2016-11-19 ENCOUNTER — Ambulatory Visit (INDEPENDENT_AMBULATORY_CARE_PROVIDER_SITE_OTHER): Payer: BLUE CROSS/BLUE SHIELD | Admitting: Rheumatology

## 2016-11-19 VITALS — BP 126/64 | HR 68 | Resp 14 | Ht 68.0 in | Wt 183.0 lb

## 2016-11-19 DIAGNOSIS — Z87898 Personal history of other specified conditions: Secondary | ICD-10-CM

## 2016-11-19 DIAGNOSIS — M47816 Spondylosis without myelopathy or radiculopathy, lumbar region: Secondary | ICD-10-CM

## 2016-11-19 DIAGNOSIS — M25562 Pain in left knee: Secondary | ICD-10-CM

## 2016-11-19 DIAGNOSIS — M79671 Pain in right foot: Secondary | ICD-10-CM | POA: Diagnosis not present

## 2016-11-19 DIAGNOSIS — M25551 Pain in right hip: Secondary | ICD-10-CM | POA: Diagnosis not present

## 2016-11-19 DIAGNOSIS — M3501 Sicca syndrome with keratoconjunctivitis: Secondary | ICD-10-CM | POA: Diagnosis not present

## 2016-11-19 DIAGNOSIS — M79642 Pain in left hand: Secondary | ICD-10-CM

## 2016-11-19 DIAGNOSIS — M79672 Pain in left foot: Secondary | ICD-10-CM | POA: Diagnosis not present

## 2016-11-19 DIAGNOSIS — Z8659 Personal history of other mental and behavioral disorders: Secondary | ICD-10-CM | POA: Diagnosis not present

## 2016-11-19 DIAGNOSIS — Z79899 Other long term (current) drug therapy: Secondary | ICD-10-CM | POA: Diagnosis not present

## 2016-11-19 DIAGNOSIS — Z8639 Personal history of other endocrine, nutritional and metabolic disease: Secondary | ICD-10-CM | POA: Diagnosis not present

## 2016-11-19 DIAGNOSIS — M25561 Pain in right knee: Secondary | ICD-10-CM | POA: Diagnosis not present

## 2016-11-19 DIAGNOSIS — M79641 Pain in right hand: Secondary | ICD-10-CM | POA: Diagnosis not present

## 2016-11-19 DIAGNOSIS — G8929 Other chronic pain: Secondary | ICD-10-CM

## 2016-11-19 DIAGNOSIS — E559 Vitamin D deficiency, unspecified: Secondary | ICD-10-CM | POA: Diagnosis not present

## 2016-11-19 DIAGNOSIS — M3390 Dermatopolymyositis, unspecified, organ involvement unspecified: Secondary | ICD-10-CM

## 2016-11-19 LAB — COMPLETE METABOLIC PANEL WITH GFR
ALBUMIN: 4.2 g/dL (ref 3.6–5.1)
ALK PHOS: 81 U/L (ref 33–130)
ALT: 16 U/L (ref 6–29)
AST: 24 U/L (ref 10–35)
BUN: 10 mg/dL (ref 7–25)
CHLORIDE: 104 mmol/L (ref 98–110)
CO2: 30 mmol/L (ref 20–31)
Calcium: 9.8 mg/dL (ref 8.6–10.4)
Creat: 0.76 mg/dL (ref 0.50–0.99)
GFR, EST NON AFRICAN AMERICAN: 86 mL/min (ref >=60)
GFR, Est African American: >89 mL/min (ref >=60)
GLUCOSE: 100 mg/dL — AB (ref 65–99)
POTASSIUM: 4.2 mmol/L (ref 3.5–5.3)
SODIUM: 141 mmol/L (ref 135–146)
Total Bilirubin: 0.5 mg/dL (ref 0.2–1.2)
Total Protein: 6.6 g/dL (ref 6.1–8.1)

## 2016-11-19 LAB — CBC WITH DIFFERENTIAL/PLATELET
BASOS ABS: 51 {cells}/uL (ref 0–200)
Basophils Relative: 1 %
EOS PCT: 6 %
Eosinophils Absolute: 306 cells/uL (ref 15–500)
HCT: 39 % (ref 35.0–45.0)
HEMOGLOBIN: 12.9 g/dL (ref 11.7–15.5)
LYMPHS ABS: 714 {cells}/uL — AB (ref 850–3900)
LYMPHS PCT: 14 %
MCH: 28.8 pg (ref 27.0–33.0)
MCHC: 33.1 g/dL (ref 32.0–36.0)
MCV: 87.1 fL (ref 80.0–100.0)
MONOS PCT: 8 %
MPV: 9.5 fL (ref 7.5–12.5)
Monocytes Absolute: 408 cells/uL (ref 200–950)
NEUTROS PCT: 71 %
Neutro Abs: 3621 cells/uL (ref 1500–7800)
PLATELETS: 239 10*3/uL (ref 140–400)
RBC: 4.48 MIL/uL (ref 3.80–5.10)
RDW: 13.3 % (ref 11.0–15.0)
WBC: 5.1 10*3/uL (ref 3.8–10.8)

## 2016-11-19 MED ORDER — AZATHIOPRINE 50 MG PO TABS: 150.0000 mg | ORAL_TABLET | Freq: Every day | ORAL | 2 refills | Status: DC

## 2016-11-19 NOTE — Patient Instructions (Addendum)
Prednisone taper: Increase prednisone dose to 5 mg daily for one month, then take prednisone 4 mg daily for one month, then take prednisone 3 mg daily for one month, then decrease to prednisone 2 mg daily and continue this dose until your follow up appointment.    Imuran instructions: Increase to 150 mg (3 tablets) by mouth daily.   Standing Labs We placed an order today for your standing lab work.    Please come back and get your standing labs in 2 weeks then every 3 months.  We have open lab Monday through Friday from 8:30-11:30 AM and 1:30-4 PM at the office of Dr. Tresa Moore, PA.   The office is located at [address], [place], [place], [place] [address] No appointment is necessary.   Labs are drawn by [facility].  You may receive a bill from [facility] for your lab work.

## 2016-11-19 NOTE — Progress Notes (Signed)
Pharmacy note:  Plan per Dr. Estanislado Pandy was to increase prednisone to 5 mg daily for one month, then taper by 1 mg each month, and to increase Imuran dose to 150 mg daily.  Patient was previously on Imuran 150 mg daily.  Reviewed her chart and noted that Imuran was decreased due to GI side effects.  Discussed this with patient who is agreeable to increase Imuran dose back up to 150 mg daily.  Counseled patient on dosing instructions of both Imuran and prednisone with patient.  Patient was given new standing lab instructions.  Reviewed patient's medication list.  Noted that diclofenac was listed on patient's medication list.  Patient confirms she is not taking diclofenac.  I counseled patient on the importance of avoiding NSAIDs while on prednisone due to increased risk of GI adverse effects.  Also advised patient to ensure her primary care provider is monitoring her blood pressure and blood sugar while on prednisone.  Patient voiced understanding and denied any questions at this time.     Elisabeth Most, Pharm.D., BCPS, CPP Clinical Pharmacist Pager: [phone] Phone: [phone] 11/19/2016 12:43 PM

## 2016-11-20 LAB — CK: Total CK: 86 U/L (ref 7–177)

## 2016-11-20 LAB — SEDIMENTATION RATE: Sed Rate: 5 mm/hr (ref 0–30)

## 2016-11-20 NOTE — Progress Notes (Signed)
Labs normal.

## 2016-12-09 ENCOUNTER — Other Ambulatory Visit: Payer: Self-pay | Admitting: Rheumatology

## 2016-12-09 NOTE — Telephone Encounter (Signed)
Last Visit: 11/19/16 Next Visit: 03/22/17  Okay to refill Prednisone?

## 2016-12-09 NOTE — Telephone Encounter (Signed)
ok 

## 2017-03-09 ENCOUNTER — Other Ambulatory Visit: Payer: Self-pay | Admitting: Rheumatology

## 2017-03-09 NOTE — Telephone Encounter (Signed)
ok 

## 2017-03-09 NOTE — Telephone Encounter (Signed)
Last Visit: 11/19/16 Next Visit: 03/22/17  Okay to refill prednisone?

## 2017-03-18 DIAGNOSIS — M19042 Primary osteoarthritis, left hand: Secondary | ICD-10-CM

## 2017-03-18 DIAGNOSIS — M19072 Primary osteoarthritis, left ankle and foot: Secondary | ICD-10-CM

## 2017-03-18 DIAGNOSIS — M19041 Primary osteoarthritis, right hand: Secondary | ICD-10-CM | POA: Insufficient documentation

## 2017-03-18 DIAGNOSIS — M19071 Primary osteoarthritis, right ankle and foot: Secondary | ICD-10-CM | POA: Insufficient documentation

## 2017-03-18 NOTE — Progress Notes (Signed)
Office Visit Note  Patient: Carmen Woods             Date of Birth: [birth year]           MRN: [id number]             PCP: Glenda Chroman, MD Referring: Glenda Chroman, MD Visit Date: 03/22/2017 Occupation: @GUAROCC @    Subjective:  Rash on arms, chest and legs.   History of Present Illness: Carmen Woods is a 61 y.o. female with history of dermatomyositis and Sjogren's. She states she's noticed some rash on her bilateral arms, chest and her bilateral lower extremities. According to her is been going on for the last few months. It is not pruritic. She continues to have some joint pain but no joint swelling. She has lower back pain off and on. She states she takes care of her husband who has Parkinson's disease and the flares her back discomfort symptoms. She continues to have sicca symptoms.  Activities of Daily Living:  Patient reports morning stiffness for 45 minutes.   Patient Denies nocturnal pain.  Difficulty dressing/grooming: Denies Difficulty climbing stairs: Reports Difficulty getting out of chair: Denies Difficulty using hands for taps, buttons, cutlery, and/or writing: Denies   Review of Systems  Constitutional: Positive for fatigue. Negative for night sweats, weight gain, weight loss and weakness.  HENT: Positive for mouth dryness. Negative for mouth sores, trouble swallowing, trouble swallowing and nose dryness.   Eyes: Positive for dryness. Negative for pain, redness and visual disturbance.  Respiratory: Negative for cough, shortness of breath and difficulty breathing.   Cardiovascular: Positive for palpitations. Negative for chest pain, hypertension, irregular heartbeat and swelling in legs/feet.  Gastrointestinal: Negative for blood in stool and constipation.  Endocrine: Negative for increased urination.  Genitourinary: Negative for vaginal dryness.  Musculoskeletal: Positive for arthralgias, joint pain and morning stiffness. Negative for joint  swelling, myalgias, muscle weakness, muscle tenderness and myalgias.  Skin: Positive for rash. Negative for color change, hair loss, skin tightness, ulcers and sensitivity to sunlight.  Allergic/Immunologic: Negative for susceptible to infections.  Neurological: Negative for dizziness, memory loss and night sweats.  Hematological: Negative for swollen glands.  Psychiatric/Behavioral: Positive for depressed mood, self-injury and sleep disturbance. The patient is nervous/anxious.     PMFS History:  Patient Active Problem List   Diagnosis Date Noted  . Primary osteoarthritis of both hands 03/18/2017  . Primary osteoarthritis of both feet 03/18/2017  . High risk medication use 11/10/2016  . Galtron's Papules 11/10/2016  . Other fatigue 11/10/2016  . Spondylosis of lumbar region without myelopathy or radiculopathy 11/10/2016  . History of anxiety 11/10/2016  . Vitamin D deficiency 11/10/2016  . History of vertigo 11/10/2016  . Sjogren's syndrome with keratoconjunctivitis sicca ([place]) 11/10/2016  . History of hypothyroidism 11/10/2016  . Dermatomyositis ([place]) 01/07/2015  . Mitral regurgitation 01/19/2013  . Precordial pain 01/19/2013    Past Medical History:  Diagnosis Date  . DDD (degenerative disc disease), lumbar   . Dermatomyositis ([place]) 12/2014  . Difficulty swallowing   . Dry eyes   . Dry mouth   . Hypothyroidism   . Mass of left thigh 12/2014  . Rash    hands, left thigh    Family History  Problem Relation Age of Onset  . Diabetes Father   . Kidney disease Sister 73  . Heart Problems Mother        Pacermaker  . Breast cancer Mother    Past Surgical  History:  Procedure Laterality Date  . BACK SURGERY  1998  . BELPHAROPTOSIS REPAIR Bilateral 11/2014  . MUSCLE BIOPSY Left 01/07/2015   Procedure: LEFT THIGH MUSCLE BIOPSY;  Surgeon: Fanny Skates, MD;  Location: [facility];  Service: General;  Laterality: Left;   Social History   Social History Narrative    . No narrative on file     Objective: Vital Signs: BP 138/71   Pulse 64   Resp 14   Ht 5\' 8"  (1.727 m)   Wt 180 lb (81.6 kg)   BMI 27.37 kg/m    Physical Exam  Constitutional: She is oriented to person, place, and time. She appears well-developed and well-nourished.  HENT:  Head: Normocephalic and atraumatic.  Eyes: Conjunctivae and EOM are normal.  Neck: Normal range of motion.  Cardiovascular: Normal rate, regular rhythm, normal heart sounds and intact distal pulses.   Pulmonary/Chest: Effort normal and breath sounds normal.  Abdominal: Soft. Bowel sounds are normal.  Lymphadenopathy:    She has no cervical adenopathy.  Neurological: She is alert and oriented to person, place, and time.  Skin: Skin is warm and dry. Capillary refill takes less than 2 seconds. Rash noted.  Few scattered erythematous papular lesions noted on bilateral upper extremities in her neck.  Psychiatric: She has a normal mood and affect. Her behavior is normal.  Nursing note and vitals reviewed.    Musculoskeletal Exam: C-spine and thoracic lumbar spine good range of motion. Shoulder joints elbow joints wrist joint MCPs PIPs DIPs with good range of motion. She does have DIP PIP thickening in her hands and feet consistent with osteoarthritis. Crepitus in bilateral knee joints was noted. No warmth swelling or effusion was noted. She had no muscular weakness or tenderness on examination.  CDAI Exam: No CDAI exam completed.    Investigation: No additional findings. CBC    Component Value Date/Time   WBC 5.1 11/19/2016 1227   RBC 4.48 11/19/2016 1227   HGB 12.9 11/19/2016 1227   HCT 39.0 11/19/2016 1227   PLT 239 11/19/2016 1227   MCV 87.1 11/19/2016 1227   MCH 28.8 11/19/2016 1227   MCHC 33.1 11/19/2016 1227   RDW 13.3 11/19/2016 1227   LYMPHSABS 714 (L) 11/19/2016 1227   MONOABS 408 11/19/2016 1227   EOSABS 306 11/19/2016 1227   BASOSABS 51 11/19/2016 1227   CMP     Component Value  Date/Time   NA 141 11/19/2016 1227   K 4.2 11/19/2016 1227   CL 104 11/19/2016 1227   CO2 30 11/19/2016 1227   GLUCOSE 100 (H) 11/19/2016 1227   BUN 10 11/19/2016 1227   CREATININE 0.76 11/19/2016 1227   CALCIUM 9.8 11/19/2016 1227   PROT 6.6 11/19/2016 1227   ALBUMIN 4.2 11/19/2016 1227   AST 24 11/19/2016 1227   ALT 16 11/19/2016 1227   ALKPHOS 81 11/19/2016 1227   BILITOT 0.5 11/19/2016 1227   GFRNONAA 86 11/19/2016 1227   GFRAA >89 11/19/2016 1227   CK86, ESR5 Imaging: No results found.  Speciality Comments: No specialty comments available.    Procedures:  No procedures performed Allergies: Codeine and Hydrocodone   Assessment / Plan:     Visit Diagnoses: Dermatomyositis ([place]) - Positive biopsy May 2016, Ro 52 positive, elevated CK and aldolase, Gottron's papules, muscle weakness, fatigue, positive Ro, positive anti-chromatin . She does not appear to have any muscle weakness or tenderness on examination today. I've advised her to taper her prednisone down to 1 mg  by mouth daily. We will continue current dose of Imuran.- Plan: CK, CK  Sjogren's syndrome with keratoconjunctivitis sicca ([place]): She continues to have some sicca symptoms for which she's been over-the-counter products and Restasis.  High risk medication use - Imuran 150 mg by mouth daily, prednisone 2 mg by mouth daily - Plan: CBC with Differential/Platelet, COMPLETE METABOLIC PANEL WITH GFR, CBC with Differential/Platelet, COMPLETE METABOLIC PANEL WITH GFR. We will check labs today and then every 3 months to monitor for drug toxicity.  Rash: She has few scattered lesions on upper extremity and lower neck area, it does not look like typical rash from dermatomyositis. I've advised her to schedule an appointment with the dermatologist.  Spondylosis of lumbar region without myelopathy or radiculopathy: Chronic pain  Primary osteoarthritis of both hands: Joint protection and muscle strengthening  discussed.  Primary osteoarthritis of both feet: For proper fitting shoes were discussed.  History of anxiety  History of hypothyroidism  History of vertigo  Vitamin D deficiency    Orders: Orders Placed This Encounter  Procedures  . CBC with Differential/Platelet  . COMPLETE METABOLIC PANEL WITH GFR  . CK  . CBC with Differential/Platelet  . COMPLETE METABOLIC PANEL WITH GFR  . CK   No orders of the defined types were placed in this encounter.   Face-to-face time spent with patient was 30 minutes. 50% of time was spent in counseling and coordination of care.  Follow-Up Instructions: Return for Dermatomyositis, Sjogren's, Osteoarthritis.   Bo Merino, MD  Note - This record has been created using [employer].  Chart creation errors have been sought, but may not always  have been located. Such creation errors do not reflect on  the standard of medical care.

## 2017-03-22 ENCOUNTER — Ambulatory Visit (INDEPENDENT_AMBULATORY_CARE_PROVIDER_SITE_OTHER): Payer: BLUE CROSS/BLUE SHIELD | Admitting: Rheumatology

## 2017-03-22 ENCOUNTER — Encounter: Payer: Self-pay | Admitting: Rheumatology

## 2017-03-22 VITALS — BP 138/71 | HR 64 | Resp 14 | Ht 68.0 in | Wt 180.0 lb

## 2017-03-22 DIAGNOSIS — M3390 Dermatopolymyositis, unspecified, organ involvement unspecified: Secondary | ICD-10-CM | POA: Diagnosis not present

## 2017-03-22 DIAGNOSIS — M19041 Primary osteoarthritis, right hand: Secondary | ICD-10-CM

## 2017-03-22 DIAGNOSIS — E559 Vitamin D deficiency, unspecified: Secondary | ICD-10-CM | POA: Diagnosis not present

## 2017-03-22 DIAGNOSIS — M19042 Primary osteoarthritis, left hand: Secondary | ICD-10-CM

## 2017-03-22 DIAGNOSIS — M19071 Primary osteoarthritis, right ankle and foot: Secondary | ICD-10-CM

## 2017-03-22 DIAGNOSIS — M3501 Sicca syndrome with keratoconjunctivitis: Secondary | ICD-10-CM | POA: Diagnosis not present

## 2017-03-22 DIAGNOSIS — Z79899 Other long term (current) drug therapy: Secondary | ICD-10-CM

## 2017-03-22 DIAGNOSIS — Z8639 Personal history of other endocrine, nutritional and metabolic disease: Secondary | ICD-10-CM | POA: Diagnosis not present

## 2017-03-22 DIAGNOSIS — M47816 Spondylosis without myelopathy or radiculopathy, lumbar region: Secondary | ICD-10-CM | POA: Diagnosis not present

## 2017-03-22 DIAGNOSIS — Z87898 Personal history of other specified conditions: Secondary | ICD-10-CM

## 2017-03-22 DIAGNOSIS — Z8659 Personal history of other mental and behavioral disorders: Secondary | ICD-10-CM

## 2017-03-22 DIAGNOSIS — M19072 Primary osteoarthritis, left ankle and foot: Secondary | ICD-10-CM

## 2017-03-22 LAB — CBC WITH DIFFERENTIAL/PLATELET
BASOS PCT: 1 %
Basophils Absolute: 70 cells/uL (ref 0–200)
EOS ABS: 140 {cells}/uL (ref 15–500)
Eosinophils Relative: 2 %
HCT: 40.1 % (ref 35.0–45.0)
Hemoglobin: 13.5 g/dL (ref 11.7–15.5)
Lymphocytes Relative: 14 %
Lymphs Abs: 980 cells/uL (ref 850–3900)
MCH: 29.5 pg (ref 27.0–33.0)
MCHC: 33.7 g/dL (ref 32.0–36.0)
MCV: 87.6 fL (ref 80.0–100.0)
MONO ABS: 630 {cells}/uL (ref 200–950)
MONOS PCT: 9 %
MPV: 9.2 fL (ref 7.5–12.5)
Neutro Abs: 5180 cells/uL (ref 1500–7800)
Neutrophils Relative %: 74 %
PLATELETS: 268 10*3/uL (ref 140–400)
RBC: 4.58 MIL/uL (ref 3.80–5.10)
RDW: 13.3 % (ref 11.0–15.0)
WBC: 7 10*3/uL (ref 3.8–10.8)

## 2017-03-22 LAB — COMPLETE METABOLIC PANEL WITH GFR
ALT: 16 U/L (ref 6–29)
AST: 23 U/L (ref 10–35)
Albumin: 4.3 g/dL (ref 3.6–5.1)
Alkaline Phosphatase: 99 U/L (ref 33–130)
BILIRUBIN TOTAL: 0.8 mg/dL (ref 0.2–1.2)
BUN: 12 mg/dL (ref 7–25)
CO2: 25 mmol/L (ref 20–31)
Calcium: 9.4 mg/dL (ref 8.6–10.4)
Chloride: 105 mmol/L (ref 98–110)
Creat: 0.63 mg/dL (ref 0.50–0.99)
GFR, Est Non African American: >89 mL/min (ref >=60)
GLUCOSE: 92 mg/dL (ref 65–99)
Potassium: 3.8 mmol/L (ref 3.5–5.3)
Sodium: 140 mmol/L (ref 135–146)
TOTAL PROTEIN: 6.9 g/dL (ref 6.1–8.1)

## 2017-03-22 LAB — CK: Total CK: 155 U/L — ABNORMAL HIGH (ref 29–143)

## 2017-03-22 NOTE — Patient Instructions (Signed)
Standing Labs We placed an order today for your standing lab work.    Please come back and get your standing labs in October and every 3 months.CBC, CMP, CK  We have open lab Monday through Friday from 8:30-11:30 AM and 1:30-4 PM at the office of Dr. Tresa Moore, PA.   The office is located at [address], [place], [place], [place] [address] No appointment is necessary.   Labs are drawn by [facility].  You may receive a bill from [facility] for your lab work. If you have any questions regarding directions or hours of operation,  please call [phone].

## 2017-03-23 NOTE — Progress Notes (Signed)
Please advise her to stay on prednisone 2 mg by mouth daily as her CK went up. We will check her CK can with the next lab work.

## 2017-06-07 ENCOUNTER — Other Ambulatory Visit: Payer: Self-pay | Admitting: Rheumatology

## 2017-06-07 NOTE — Telephone Encounter (Signed)
Last Visit: 03/22/17 Next Visit: 08/25/17 Labs: 03/22/17 CBC/CMP WNL  Okay to refill per Dr. Estanislado Pandy

## 2017-07-06 ENCOUNTER — Telehealth: Payer: Self-pay | Admitting: Rheumatology

## 2017-07-06 ENCOUNTER — Other Ambulatory Visit: Payer: Self-pay | Admitting: Rheumatology

## 2017-07-06 DIAGNOSIS — M3390 Dermatopolymyositis, unspecified, organ involvement unspecified: Secondary | ICD-10-CM

## 2017-07-06 DIAGNOSIS — Z79899 Other long term (current) drug therapy: Secondary | ICD-10-CM

## 2017-07-06 NOTE — Telephone Encounter (Signed)
ok 

## 2017-07-06 NOTE — Telephone Encounter (Signed)
Please send lab orders to [facility] in [place]. Patient going tomorrow 10/17

## 2017-07-06 NOTE — Telephone Encounter (Addendum)
Last visit: 03/22/17 Next visit: 08/25/17 Labs: 03/22/17 CK went up  Advised patient she is due for labs. She will get them done as soon as she can.   Ok to refill 30 day supply of Imuran?

## 2017-07-07 ENCOUNTER — Other Ambulatory Visit: Payer: Self-pay | Admitting: *Deleted

## 2017-07-07 ENCOUNTER — Telehealth: Payer: Self-pay

## 2017-07-07 DIAGNOSIS — Z79899 Other long term (current) drug therapy: Secondary | ICD-10-CM

## 2017-07-07 NOTE — Telephone Encounter (Signed)
Labs released.  

## 2017-07-07 NOTE — Telephone Encounter (Signed)
[place] with [facility] is needing labs orders for patient.  Patient is currently at the office.  CB# [phone].

## 2017-07-08 LAB — COMPLETE METABOLIC PANEL WITH GFR
AG RATIO: 1.7 (calc) (ref 1.0–2.5)
ALBUMIN MSPROF: 4.2 g/dL (ref 3.6–5.1)
ALT: 13 U/L (ref 6–29)
AST: 25 U/L (ref 10–35)
Alkaline phosphatase (APISO): 85 U/L (ref 33–130)
BILIRUBIN TOTAL: 0.5 mg/dL (ref 0.2–1.2)
BUN: 8 mg/dL (ref 7–25)
CHLORIDE: 106 mmol/L (ref 98–110)
CO2: 25 mmol/L (ref 20–32)
Calcium: 9.4 mg/dL (ref 8.6–10.4)
Creat: 0.69 mg/dL (ref 0.50–0.99)
GFR, EST AFRICAN AMERICAN: 110 mL/min/{1.73_m2} (ref >=60)
GFR, Est Non African American: 95 mL/min/{1.73_m2} (ref >=60)
GLOBULIN: 2.5 g/dL (ref 1.9–3.7)
Glucose, Bld: 82 mg/dL (ref 65–139)
POTASSIUM: 3.9 mmol/L (ref 3.5–5.3)
Sodium: 145 mmol/L (ref 135–146)
TOTAL PROTEIN: 6.7 g/dL (ref 6.1–8.1)

## 2017-07-08 LAB — CBC WITH DIFFERENTIAL/PLATELET
BASOS ABS: 39 {cells}/uL (ref 0–200)
Basophils Relative: 0.8 %
EOS ABS: 211 {cells}/uL (ref 15–500)
Eosinophils Relative: 4.3 %
HEMATOCRIT: 35.9 % (ref 35.0–45.0)
HEMOGLOBIN: 12.5 g/dL (ref 11.7–15.5)
LYMPHS ABS: 706 {cells}/uL — AB (ref 850–3900)
MCH: 30 pg (ref 27.0–33.0)
MCHC: 34.8 g/dL (ref 32.0–36.0)
MCV: 86.3 fL (ref 80.0–100.0)
MPV: 9.9 fL (ref 7.5–12.5)
Monocytes Relative: 7.9 %
NEUTROS ABS: 3557 {cells}/uL (ref 1500–7800)
Neutrophils Relative %: 72.6 %
Platelets: 234 10*3/uL (ref 140–400)
RBC: 4.16 10*6/uL (ref 3.80–5.10)
RDW: 12.3 % (ref 11.0–15.0)
Total Lymphocyte: 14.4 %
WBC mixed population: 387 cells/uL (ref 200–950)
WBC: 4.9 10*3/uL (ref 3.8–10.8)

## 2017-07-08 LAB — CK: Total CK: 401 U/L — ABNORMAL HIGH (ref 29–143)

## 2017-07-08 NOTE — Telephone Encounter (Signed)
WNL

## 2017-07-08 NOTE — Telephone Encounter (Signed)
Patient had labs drawn and we have received results in the office.

## 2017-07-08 NOTE — Telephone Encounter (Signed)
Patient Carmen Woods has increased. She's been tapering prednisone. This indicates dermatomyositis flare. Please advise her to increase prednisone to 10 mg by mouth daily and repeat Carmen Woods in one-month.

## 2017-07-09 ENCOUNTER — Other Ambulatory Visit: Payer: Self-pay | Admitting: *Deleted

## 2017-07-09 DIAGNOSIS — M2550 Pain in unspecified joint: Secondary | ICD-10-CM

## 2017-07-22 HISTORY — PX: OTHER SURGICAL HISTORY: SHX169

## 2017-08-13 NOTE — Progress Notes (Signed)
Office Visit Note  Patient: Carmen Woods             Date of Birth: 09/24/1955           MRN: [id number]             PCP: Glenda Chroman, MD Referring: Glenda Chroman, MD Visit Date: 08/25/2017 Occupation: @GUAROCC @    Subjective:  Knee and hip pain   History of Present Illness: [name] is a 61 y.o. female with history of dermatomyositis, Sjogren's syndrome, and osteoarthritis.  Patient is currently taking Imuran and Prednisone 9 mg and tapering by 1 mg each month.  She states she still experiences hand swelling bilaterally with redness over her MCPs.  She also has redness on her face, which she reports is worse first thing in the morning.  She states she continues to have sicca symptoms.  She used to use Biotene products but she has not recently.  She also uses Restasis.  She saw her dermatologist Dr. Tarri Glenn and was diagnosed with 6 basal cell carcinoma lesions, and she has since had surgery on 6 of the lesions. The lesions are scattered on her body.  She states she is having bilateral hip and knee pain.  She is going to follow up with [facility] in regards to her knee osteoarthritis.  She continues to have plantar fasciitis bilaterally and uses fit flops first thing in the morning.  She reports she feels unsteady on her feet first thing in the morning.     She reports she continues to take Vitamin D 2,000 units daily.   She has had significant insomnia and fatigue due to life stresses with her husband who is in a nursing home with Parkinson's.  She states Ambien helps her sleep.   She has Ambien 10 mg but she only takes half a tablet at night.    Activities of Daily Living:  Patient reports morning stiffness for 1 hour.   Patient Reports nocturnal pain.  Difficulty dressing/grooming: Denies Difficulty climbing stairs: Reports Difficulty getting out of chair: Reports Difficulty using hands for taps, buttons, cutlery, and/or writing: Denies   Review of  Systems  Constitutional: Negative for fatigue and weakness.  HENT: Positive for mouth dryness. Negative for mouth sores and nose dryness.   Eyes: Positive for dryness. Negative for redness.  Respiratory: Negative for cough, hemoptysis, shortness of breath, wheezing and difficulty breathing.   Cardiovascular: Positive for irregular heartbeat. Negative for chest pain, palpitations, hypertension and swelling in legs/feet.  Gastrointestinal: Negative for blood in stool, constipation and diarrhea.  Endocrine: Negative for increased urination.  Genitourinary: Negative for painful urination.  Musculoskeletal: Positive for arthralgias, joint pain, joint swelling, muscle weakness and morning stiffness. Negative for myalgias, muscle tenderness and myalgias.  Skin: Positive for redness (bilateral MCPs). Negative for color change, pallor, rash, hair loss, nodules/bumps, skin tightness, ulcers and sensitivity to sunlight.  Allergic/Immunologic: Negative for susceptible to infections.  Neurological: Negative for dizziness, numbness and headaches.  Hematological: Negative for swollen glands.  Psychiatric/Behavioral: Positive for depressed mood (On Lexapro) and sleep disturbance. The patient is nervous/anxious.     PMFS History:  Patient Active Problem List   Diagnosis Date Noted  . Basal cell carcinoma (BCC) 08/25/2017  . Primary osteoarthritis of both hands 03/18/2017  . Primary osteoarthritis of both feet 03/18/2017  . High risk medication use 11/10/2016  . Galtron's Papules 11/10/2016  . Other fatigue 11/10/2016  . Spondylosis of lumbar region without myelopathy  or radiculopathy 11/10/2016  . History of anxiety 11/10/2016  . Vitamin D deficiency 11/10/2016  . History of vertigo 11/10/2016  . Sjogren's syndrome with keratoconjunctivitis sicca ([place]) 11/10/2016  . History of hypothyroidism 11/10/2016  . Dermatomyositis ([place]) 01/07/2015  . Mitral regurgitation 01/19/2013  . Precordial pain  01/19/2013    Past Medical History:  Diagnosis Date  . DDD (degenerative disc disease), lumbar   . Dermatomyositis ([place]) 12/2014  . Difficulty swallowing   . Dry eyes   . Dry mouth   . Hypothyroidism   . Mass of left thigh 12/2014  . Rash    hands, left thigh    Family History  Problem Relation Age of Onset  . Heart Problems Mother        Pacermaker  . Breast cancer Mother   . Diabetes Father   . Osteoarthritis Sister   . Kidney disease Sister 28  . Rheum arthritis Sister   . Thyroid disease Sister    Past Surgical History:  Procedure Laterality Date  . BACK SURGERY  1998  . BELPHAROPTOSIS REPAIR Bilateral 11/2014  . MUSCLE BIOPSY Left 01/07/2015   Procedure: LEFT THIGH MUSCLE BIOPSY;  Surgeon: Fanny Skates, MD;  Location: [facility];  Service: General;  Laterality: Left;  . skin cancer removal   07/2017   Social History   Social History Narrative  . Not on file     Objective: Vital Signs: BP 131/65 (BP Location: Left Arm, Patient Position: Sitting, Cuff Size: Normal)   Pulse 65   Resp 13   Ht 5\' 8"  (1.727 m)   Wt 185 lb (83.9 kg)   BMI 28.13 kg/m    Physical Exam  Constitutional: She is oriented to person, place, and time. She appears well-developed and well-nourished.  HENT:  Head: Normocephalic and atraumatic.  Eyes: Conjunctivae and EOM are normal.  Neck: Normal range of motion.  Cardiovascular: Normal rate, regular rhythm, normal heart sounds and intact distal pulses.  Pulmonary/Chest: Effort normal and breath sounds normal.  Abdominal: Soft. Bowel sounds are normal.  Lymphadenopathy:    She has no cervical adenopathy.  Neurological: She is alert and oriented to person, place, and time.  Skin: Skin is warm and dry. Capillary refill takes less than 2 seconds.  Healing of post-surgical basal cell carcinoma lesions-multiple sites scattered  Gottron's papules mild on MCPs bilaterally  Psychiatric: She has a normal mood and affect. Her  behavior is normal.  Nursing note and vitals reviewed.    Musculoskeletal Exam: C-spine, thoracic spine, and lumbar spine good ROM.  Shoulder joints, elbow joints, and wrist joints good ROM.  MCPs, PIPs, and DIPs good ROM.  PIP and DIP synovial thickening in hands consistent with osteoarthritis.  Crepitus in bilateral knees noted.  No effusion present.  No greater trochanteric bursitis.      CDAI Exam: No CDAI exam completed.    Investigation: 07/07/17 CK 401, 08/19/17 CK 69 No additional findings. CBC Latest Ref Rng & Units 07/07/2017 03/22/2017 11/19/2016  WBC 3.8 - 10.8 Thousand/uL 4.9 7.0 5.1  Hemoglobin 11.7 - 15.5 g/dL 12.5 13.5 12.9  Hematocrit 35.0 - 45.0 % 35.9 40.1 39.0  Platelets 140 - 400 Thousand/uL 234 268 239   CMP Latest Ref Rng & Units 07/07/2017 03/22/2017 11/19/2016  Glucose 65 - 139 mg/dL 82 92 100(H)  BUN 7 - 25 mg/dL 8 12 10   Creatinine 0.50 - 0.99 mg/dL 0.69 0.63 0.76  Sodium 135 - 146 mmol/L 145 140 141  Potassium 3.5 - 5.3 mmol/L 3.9 3.8 4.2  Chloride 98 - 110 mmol/L 106 105 104  CO2 20 - 32 mmol/L 25 25 30   Calcium 8.6 - 10.4 mg/dL 9.4 9.4 9.8  Total Protein 6.1 - 8.1 g/dL 6.7 6.9 6.6  Total Bilirubin 0.2 - 1.2 mg/dL 0.5 0.8 0.5  Alkaline Phos 33 - 130 U/L - 99 81  AST 10 - 35 U/L 25 23 24   ALT 6 - 29 U/L 13 16 16     Imaging: No results found.  Speciality Comments: No specialty comments available.    Procedures:  No procedures performed Allergies: Codeine and Hydrocodone   Assessment / Plan:     Visit Diagnoses: Dermatomyositis ([place]) - Positive biopsy May 2016, Ro 52 positive, elevated CK and aldolase, Gottron's papules, muscle weakness, fatigue, positive Ro, positive anti-chromatin - Patient was having a flare in October 2018, on 07/07/17 her CK was 401. At the time she was on prednisone 2 mg by mouth daily.  She was started on increased dose of prednisone at 10 mg by mouth daily . Her most recent CK on 08/19/17 was WNL at 69. Patient continues to  have gottron's papules on MCPs bilaterally which are much milder than initial visit.. No synovitis on exam. Patient continues to tolerate Imuran 50 mg BID daily.  She is currently on Prednisone 9 mg and will continue to taper by 1 mg every month.   Plan: azaTHIOprine (IMURAN) 50 MG tablet, predniSONE (DELTASONE) 1 MG tablet. Refilled Imuran and Prednisone 1 mg tablets.    Sjogren's syndrome with keratoconjunctivitis sicca (HCC) - Reports sicca symptoms.  She continues to use Restasis.  Discussed restarting Biotene products.  Also, reminded her of the importance of good oral health to prevent dental caries.   High risk medication use - Imuran 150 mg by mouth daily, prednisone 9 mg by mouth daily, tapering 1 mg each month.  We will recheck her vitamin D level with her next labs.  She is currently taking 2,000 units daily.    Basal cell carcinoma, unspecified site:  Patient has had 6 BCC lesions surgically removed between October-November 2018.  Discussed that Imuran or any immunosuppression causes increased risk of malignancy but not in particular skin cancer.. Patient will continue to be followed by dermatologist, Dr. Tarri Glenn.  She does understand long-term side effects of Imuran.   Spondylosis of lumbar region without myelopathy or radiculopathy: Chronic pain.   Primary osteoarthritis of both hands: No synovitis. Denies any pain currently. DIP and PIP synovial thickening bilaterally.    Primary osteoarthritis of both feet: No discomfort in feet or ankles.  She wears proper fitting shoes and fit flops for plantar fasciitis symptoms.      Vitamin D deficiency: Continues to take 2,000 units daily.  Will check vitamin D levels with next labs.    History of anxiety: On Lexapro   Other medical conditions listed as follows:  History of hypothyroidism  History of vertigo  Orders: No orders of the defined types were placed in this encounter.  Meds ordered this encounter  Medications  .  azaTHIOprine (IMURAN) 50 MG tablet    Sig: Take 2 tablets (100 mg total) by mouth daily.    Dispense:  180 tablet    Refill:  0  . predniSONE (DELTASONE) 1 MG tablet    Sig: Take 1 tablet (1 mg total) by mouth every morning. Take 4 tablets by mouth every morning.    Dispense:  180 tablet  Refill:  0    Face-to-face time spent with patient was 30 minutes. Greater than 50% of time was spent in counseling and coordination of care.  Follow-Up Instructions: Return in about 5 months (around 01/23/2018) for Dermatomyositis.    Note - This record has been created using [employer].  Chart creation errors have been sought, but may not always  have been located. Such creation errors do not reflect on  the standard of medical care.

## 2017-08-19 ENCOUNTER — Other Ambulatory Visit: Payer: Self-pay | Admitting: *Deleted

## 2017-08-19 DIAGNOSIS — M2550 Pain in unspecified joint: Secondary | ICD-10-CM

## 2017-08-20 ENCOUNTER — Other Ambulatory Visit: Payer: Self-pay | Admitting: *Deleted

## 2017-08-20 ENCOUNTER — Telehealth: Payer: Self-pay | Admitting: *Deleted

## 2017-08-20 DIAGNOSIS — M2550 Pain in unspecified joint: Secondary | ICD-10-CM

## 2017-08-20 LAB — CK: Total CK: 69 U/L (ref 29–143)

## 2017-08-20 MED ORDER — PREDNISONE 5 MG PO TABS: 5.0000 mg | ORAL_TABLET | Freq: Every day | ORAL | 0 refills | Status: DC

## 2017-08-20 NOTE — Telephone Encounter (Signed)
-----   Message from Bo Merino, MD sent at 08/20/2017  8:28 AM EST ----- CK is in normal limits now. Patient may taper prednisone by 1 mg every month. Please repeat CK in 2 months.

## 2017-08-20 NOTE — Progress Notes (Signed)
CK is in normal limits now. Patient may taper prednisone by 1 mg every month. Please repeat CK in 2 months.

## 2017-08-20 NOTE — Telephone Encounter (Signed)
Patient states she needs a refill on the prednisone 5 mg.

## 2017-08-25 ENCOUNTER — Encounter: Payer: Self-pay | Admitting: Rheumatology

## 2017-08-25 ENCOUNTER — Ambulatory Visit: Payer: Medicare Other | Admitting: Rheumatology

## 2017-08-25 VITALS — BP 131/65 | HR 65 | Resp 13 | Ht 68.0 in | Wt 185.0 lb

## 2017-08-25 DIAGNOSIS — M19071 Primary osteoarthritis, right ankle and foot: Secondary | ICD-10-CM | POA: Diagnosis not present

## 2017-08-25 DIAGNOSIS — M3501 Sicca syndrome with keratoconjunctivitis: Secondary | ICD-10-CM | POA: Diagnosis not present

## 2017-08-25 DIAGNOSIS — Z8639 Personal history of other endocrine, nutritional and metabolic disease: Secondary | ICD-10-CM | POA: Diagnosis not present

## 2017-08-25 DIAGNOSIS — M19042 Primary osteoarthritis, left hand: Secondary | ICD-10-CM

## 2017-08-25 DIAGNOSIS — C4491 Basal cell carcinoma of skin, unspecified: Secondary | ICD-10-CM

## 2017-08-25 DIAGNOSIS — M47816 Spondylosis without myelopathy or radiculopathy, lumbar region: Secondary | ICD-10-CM | POA: Diagnosis not present

## 2017-08-25 DIAGNOSIS — Z87898 Personal history of other specified conditions: Secondary | ICD-10-CM

## 2017-08-25 DIAGNOSIS — Z8659 Personal history of other mental and behavioral disorders: Secondary | ICD-10-CM | POA: Diagnosis not present

## 2017-08-25 DIAGNOSIS — M3390 Dermatopolymyositis, unspecified, organ involvement unspecified: Secondary | ICD-10-CM | POA: Diagnosis not present

## 2017-08-25 DIAGNOSIS — Z79899 Other long term (current) drug therapy: Secondary | ICD-10-CM

## 2017-08-25 DIAGNOSIS — E559 Vitamin D deficiency, unspecified: Secondary | ICD-10-CM | POA: Diagnosis not present

## 2017-08-25 DIAGNOSIS — M19041 Primary osteoarthritis, right hand: Secondary | ICD-10-CM

## 2017-08-25 DIAGNOSIS — M19072 Primary osteoarthritis, left ankle and foot: Secondary | ICD-10-CM

## 2017-08-25 MED ORDER — PREDNISONE 1 MG PO TABS: 1.0000 mg | ORAL_TABLET | Freq: Every morning | ORAL | 0 refills | Status: DC

## 2017-08-25 MED ORDER — AZATHIOPRINE 50 MG PO TABS: 100.0000 mg | ORAL_TABLET | Freq: Every day | ORAL | 0 refills | Status: DC

## 2017-08-25 NOTE — Patient Instructions (Signed)
Standing Labs We placed an order today for your standing lab work.    Please come back and get your standing labs in January and every 3 months  Check Vitamin D level with next labs  We have open lab Monday through Friday from 8:30-11:30 AM and 1:30-4 PM at the office of Dr. Bo Merino.   The office is located at [address] [place], [place], [place] [address] No appointment is necessary.   Labs are drawn by [facility].  You may receive a bill from [facility] for your lab work. If you have any questions regarding directions or hours of operation,  please call [phone].

## 2017-10-11 ENCOUNTER — Other Ambulatory Visit: Payer: Self-pay

## 2017-10-11 ENCOUNTER — Other Ambulatory Visit: Payer: Self-pay | Admitting: *Deleted

## 2017-10-11 DIAGNOSIS — Z79899 Other long term (current) drug therapy: Secondary | ICD-10-CM

## 2017-10-11 LAB — CBC WITH DIFFERENTIAL/PLATELET
BASOS ABS: 57 {cells}/uL (ref 0–200)
Basophils Relative: 0.8 %
EOS ABS: 78 {cells}/uL (ref 15–500)
Eosinophils Relative: 1.1 %
HCT: 37.5 % (ref 35.0–45.0)
Hemoglobin: 13 g/dL (ref 11.7–15.5)
Lymphs Abs: 547 cells/uL — ABNORMAL LOW (ref 850–3900)
MCH: 29.7 pg (ref 27.0–33.0)
MCHC: 34.7 g/dL (ref 32.0–36.0)
MCV: 85.8 fL (ref 80.0–100.0)
MPV: 10 fL (ref 7.5–12.5)
Monocytes Relative: 3.5 %
NEUTROS PCT: 86.9 %
Neutro Abs: 6170 cells/uL (ref 1500–7800)
PLATELETS: 257 10*3/uL (ref 140–400)
RBC: 4.37 10*6/uL (ref 3.80–5.10)
RDW: 11.9 % (ref 11.0–15.0)
TOTAL LYMPHOCYTE: 7.7 %
WBC: 7.1 10*3/uL (ref 3.8–10.8)
WBCMIX: 249 {cells}/uL (ref 200–950)

## 2017-10-11 LAB — COMPLETE METABOLIC PANEL WITH GFR
AG Ratio: 1.8 (calc) (ref 1.0–2.5)
ALT: 17 U/L (ref 6–29)
AST: 23 U/L (ref 10–35)
Albumin: 4.5 g/dL (ref 3.6–5.1)
Alkaline phosphatase (APISO): 82 U/L (ref 33–130)
BUN: 8 mg/dL (ref 7–25)
CO2: 31 mmol/L (ref 20–32)
Calcium: 10.2 mg/dL (ref 8.6–10.4)
Chloride: 104 mmol/L (ref 98–110)
Creat: 0.74 mg/dL (ref 0.50–0.99)
GFR, Est African American: 101 mL/min/{1.73_m2} (ref >=60)
GFR, Est Non African American: 87 mL/min/{1.73_m2} (ref >=60)
Globulin: 2.5 g/dL (calc) (ref 1.9–3.7)
Glucose, Bld: 144 mg/dL — ABNORMAL HIGH (ref 65–99)
Potassium: 4.1 mmol/L (ref 3.5–5.3)
Sodium: 143 mmol/L (ref 135–146)
Total Bilirubin: 0.6 mg/dL (ref 0.2–1.2)
Total Protein: 7 g/dL (ref 6.1–8.1)

## 2017-10-11 NOTE — Addendum Note (Signed)
Addended by: Carole Binning on: 10/11/2017 01:54 PM   Modules accepted: Orders

## 2017-10-12 NOTE — Progress Notes (Signed)
stable °

## 2017-11-22 ENCOUNTER — Other Ambulatory Visit: Payer: Self-pay | Admitting: Rheumatology

## 2017-11-22 NOTE — Telephone Encounter (Signed)
Last Visit: 08/25/17 Next Visit: 01/25/18  Okay to refill per Dr. Estanislado Pandy

## 2017-11-24 ENCOUNTER — Telehealth: Payer: Self-pay | Admitting: Rheumatology

## 2017-11-24 DIAGNOSIS — M3390 Dermatopolymyositis, unspecified, organ involvement unspecified: Secondary | ICD-10-CM

## 2017-11-24 MED ORDER — AZATHIOPRINE 50 MG PO TABS: 100.0000 mg | ORAL_TABLET | Freq: Every day | ORAL | 0 refills | Status: DC

## 2017-11-24 MED ORDER — PREDNISONE 5 MG PO TABS: ORAL_TABLET | ORAL | 0 refills | Status: DC

## 2017-11-24 NOTE — Telephone Encounter (Signed)
Patient called requesting a prescription refill of Imuran and Prednisone.  Patient's pharmacy is [facility] in [place] and their phone number is [phone]

## 2017-11-24 NOTE — Telephone Encounter (Signed)
Last Visit: 08/25/17 Next Visit: 01/25/18 Labs: 10/11/17 Stable  Okay to refill per Dr. Estanislado Pandy

## 2017-11-25 ENCOUNTER — Telehealth: Payer: Self-pay

## 2017-11-25 NOTE — Telephone Encounter (Signed)
Received a prior authorization request for imuran from pharmacy. Authorization was submitted to pts insurance via cover my meds. Will update once we have a response.   Silverio Hagan, [place], CPhT 11:56 AM

## 2017-11-25 NOTE — Telephone Encounter (Signed)
Patient advised she will need her labs in April and then come for her appointment in May 2019. Patient verbalized understanding.

## 2017-11-25 NOTE — Telephone Encounter (Signed)
Spoke with patient. She states that Dr. Estanislado Pandy had her getting labs every three months. Her last labs were normal and wanted to know if she need to have them re-checked before her next appointment in April. Please advise.  [employer], [place], CPhT 3:18 PM

## 2017-11-25 NOTE — Telephone Encounter (Signed)
Received a fax from [facility] regarding a prior authorization approval for AZATHIOPRINE from 09/19/2017 to 09/20/2018.   Reference number: [id number] Phone number: [phone]  Will send document to scan center.  Called pt to update. Pt voices understanding and denies any questions at this time.  Francis Doenges, [place], CPhT 3:12 PM

## 2017-12-22 ENCOUNTER — Other Ambulatory Visit: Payer: Self-pay | Admitting: Rheumatology

## 2018-01-03 ENCOUNTER — Other Ambulatory Visit: Payer: Self-pay | Admitting: Rheumatology

## 2018-01-03 DIAGNOSIS — M3390 Dermatopolymyositis, unspecified, organ involvement unspecified: Secondary | ICD-10-CM

## 2018-01-03 NOTE — Telephone Encounter (Signed)
Last Visit: 08/25/17 Next Visit: 01/25/18 Labs: 10/11/17 Stable  Okay to refill per Dr. Estanislado Pandy

## 2018-01-05 ENCOUNTER — Other Ambulatory Visit: Payer: Self-pay | Admitting: Rheumatology

## 2018-01-11 NOTE — Progress Notes (Deleted)
Office Visit Note  Patient: Carmen Woods             Date of Birth: 08-24-1956           MRN: [id number]             PCP: Glenda Chroman, MD Referring: Glenda Chroman, MD Visit Date: 01/25/2018 Occupation: @GUAROCC @    Subjective:  No chief complaint on file.   History of Present Illness: Carmen Woods is a 62 y.o. female ***   Activities of Daily Living:  Patient reports morning stiffness for *** {minute/hour:19697}.   Patient {ACTIONS;DENIES/REPORTS:21021675::"Denies"} nocturnal pain.  Difficulty dressing/grooming: {ACTIONS;DENIES/REPORTS:21021675::"Denies"} Difficulty climbing stairs: {ACTIONS;DENIES/REPORTS:21021675::"Denies"} Difficulty getting out of chair: {ACTIONS;DENIES/REPORTS:21021675::"Denies"} Difficulty using hands for taps, buttons, cutlery, and/or writing: {ACTIONS;DENIES/REPORTS:21021675::"Denies"}   No Rheumatology ROS completed.   PMFS History:  Patient Active Problem List   Diagnosis Date Noted  . Basal cell carcinoma (BCC) 08/25/2017  . Primary osteoarthritis of both hands 03/18/2017  . Primary osteoarthritis of both feet 03/18/2017  . High risk medication use 11/10/2016  . Galtron's Papules 11/10/2016  . Other fatigue 11/10/2016  . Spondylosis of lumbar region without myelopathy or radiculopathy 11/10/2016  . History of anxiety 11/10/2016  . Vitamin D deficiency 11/10/2016  . History of vertigo 11/10/2016  . Sjogren's syndrome with keratoconjunctivitis sicca ([place]) 11/10/2016  . History of hypothyroidism 11/10/2016  . Dermatomyositis ([place]) 01/07/2015  . Mitral regurgitation 01/19/2013  . Precordial pain 01/19/2013    Past Medical History:  Diagnosis Date  . DDD (degenerative disc disease), lumbar   . Dermatomyositis ([place]) 12/2014  . Difficulty swallowing   . Dry eyes   . Dry mouth   . Hypothyroidism   . Mass of left thigh 12/2014  . Rash    hands, left thigh    Family History  Problem Relation Age of Onset  . Heart  Problems Mother        Pacermaker  . Breast cancer Mother   . Diabetes Father   . Osteoarthritis Sister   . Kidney disease Sister 61  . Rheum arthritis Sister   . Thyroid disease Sister    Past Surgical History:  Procedure Laterality Date  . BACK SURGERY  1998  . BELPHAROPTOSIS REPAIR Bilateral 11/2014  . MUSCLE BIOPSY Left 01/07/2015   Procedure: LEFT THIGH MUSCLE BIOPSY;  Surgeon: Fanny Skates, MD;  Location: [facility];  Service: General;  Laterality: Left;  . skin cancer removal   07/2017   Social History   Social History Narrative  . Not on file     Objective: Vital Signs: There were no vitals taken for this visit.   Physical Exam   Musculoskeletal Exam: ***  CDAI Exam: No CDAI exam completed.    Investigation: No additional findings. CBC Latest Ref Rng & Units 10/11/2017 07/07/2017 03/22/2017  WBC 3.8 - 10.8 Thousand/uL 7.1 4.9 7.0  Hemoglobin 11.7 - 15.5 g/dL 13.0 12.5 13.5  Hematocrit 35.0 - 45.0 % 37.5 35.9 40.1  Platelets 140 - 400 Thousand/uL 257 234 268   CMP Latest Ref Rng & Units 10/11/2017 07/07/2017 03/22/2017  Glucose 65 - 99 mg/dL 144(H) 82 92  BUN 7 - 25 mg/dL 8 8 12   Creatinine 0.50 - 0.99 mg/dL 0.74 0.69 0.63  Sodium 135 - 146 mmol/L 143 145 140  Potassium 3.5 - 5.3 mmol/L 4.1 3.9 3.8  Chloride 98 - 110 mmol/L 104 106 105  CO2 20 - 32 mmol/L 31 25 25   Calcium  8.6 - 10.4 mg/dL 10.2 9.4 9.4  Total Protein 6.1 - 8.1 g/dL 7.0 6.7 6.9  Total Bilirubin 0.2 - 1.2 mg/dL 0.6 0.5 0.8  Alkaline Phos 33 - 130 U/L - - 99  AST 10 - 35 U/L 23 25 23   ALT 6 - 29 U/L 17 13 16     Imaging: No results found.  Speciality Comments: No specialty comments available.    Procedures:  No procedures performed Allergies: Codeine and Hydrocodone   Assessment / Plan:     Visit Diagnoses: No diagnosis found.    Orders: No orders of the defined types were placed in this encounter.  No orders of the defined types were placed in this  encounter.   Face-to-face time spent with patient was *** minutes. 50% of time was spent in counseling and coordination of care.  Follow-Up Instructions: No follow-ups on file.   Earnestine Mealing, CMA  Note - This record has been created using Editor, commissioning.  Chart creation errors have been sought, but may not always  have been located. Such creation errors do not reflect on  the standard of medical care.

## 2018-01-13 ENCOUNTER — Other Ambulatory Visit: Payer: Self-pay | Admitting: *Deleted

## 2018-01-13 DIAGNOSIS — R5383 Other fatigue: Secondary | ICD-10-CM

## 2018-01-13 DIAGNOSIS — Z79899 Other long term (current) drug therapy: Secondary | ICD-10-CM

## 2018-01-13 DIAGNOSIS — E559 Vitamin D deficiency, unspecified: Secondary | ICD-10-CM

## 2018-01-13 DIAGNOSIS — M3390 Dermatopolymyositis, unspecified, organ involvement unspecified: Secondary | ICD-10-CM

## 2018-01-13 LAB — CBC WITH DIFFERENTIAL/PLATELET
BASOS PCT: 0.6 %
Basophils Absolute: 39 cells/uL (ref 0–200)
EOS ABS: 39 {cells}/uL (ref 15–500)
Eosinophils Relative: 0.6 %
HCT: 37.4 % (ref 35.0–45.0)
Hemoglobin: 12.8 g/dL (ref 11.7–15.5)
Lymphs Abs: 670 cells/uL — ABNORMAL LOW (ref 850–3900)
MCH: 29.2 pg (ref 27.0–33.0)
MCHC: 34.2 g/dL (ref 32.0–36.0)
MCV: 85.2 fL (ref 80.0–100.0)
MONOS PCT: 4.9 %
MPV: 9.8 fL (ref 7.5–12.5)
NEUTROS PCT: 83.6 %
Neutro Abs: 5434 cells/uL (ref 1500–7800)
PLATELETS: 268 10*3/uL (ref 140–400)
RBC: 4.39 10*6/uL (ref 3.80–5.10)
RDW: 12.1 % (ref 11.0–15.0)
TOTAL LYMPHOCYTE: 10.3 %
WBC mixed population: 319 cells/uL (ref 200–950)
WBC: 6.5 10*3/uL (ref 3.8–10.8)

## 2018-01-13 LAB — COMPLETE METABOLIC PANEL WITH GFR
AG RATIO: 1.8 (calc) (ref 1.0–2.5)
ALKALINE PHOSPHATASE (APISO): 77 U/L (ref 33–130)
ALT: 19 U/L (ref 6–29)
AST: 23 U/L (ref 10–35)
Albumin: 4.2 g/dL (ref 3.6–5.1)
BILIRUBIN TOTAL: 0.6 mg/dL (ref 0.2–1.2)
BUN: 9 mg/dL (ref 7–25)
CHLORIDE: 106 mmol/L (ref 98–110)
CO2: 31 mmol/L (ref 20–32)
Calcium: 9.5 mg/dL (ref 8.6–10.4)
Creat: 0.68 mg/dL (ref 0.50–0.99)
GFR, Est African American: 109 mL/min/{1.73_m2} (ref >=60)
GFR, Est Non African American: 94 mL/min/{1.73_m2} (ref >=60)
Globulin: 2.3 g/dL (calc) (ref 1.9–3.7)
Glucose, Bld: 122 mg/dL — ABNORMAL HIGH (ref 65–99)
POTASSIUM: 4.1 mmol/L (ref 3.5–5.3)
Sodium: 142 mmol/L (ref 135–146)
TOTAL PROTEIN: 6.5 g/dL (ref 6.1–8.1)

## 2018-01-13 LAB — CK: Total CK: 83 U/L (ref 29–143)

## 2018-01-14 LAB — VITAMIN D 25 HYDROXY (VIT D DEFICIENCY, FRACTURES): Vit D, 25-Hydroxy: 67 ng/mL (ref 30–100)

## 2018-01-14 LAB — VITAMIN B12: VITAMIN B 12: 420 pg/mL (ref 200–1100)

## 2018-01-14 LAB — TSH: TSH: 0.5 mIU/L (ref 0.40–4.50)

## 2018-01-14 NOTE — Progress Notes (Signed)
Glucose mildly elevated.  All other labs are WNL.

## 2018-01-19 ENCOUNTER — Other Ambulatory Visit: Payer: Self-pay | Admitting: *Deleted

## 2018-01-19 DIAGNOSIS — M3390 Dermatopolymyositis, unspecified, organ involvement unspecified: Secondary | ICD-10-CM

## 2018-01-19 MED ORDER — PREDNISONE 1 MG PO TABS: 4.0000 mg | ORAL_TABLET | Freq: Every morning | ORAL | 0 refills | Status: DC

## 2018-01-19 NOTE — Telephone Encounter (Signed)
Last Visit: 08/25/17 Next Visit: 01/25/18  Okay to refill per Dr. Estanislado Pandy  Patient currently on Prednisone 4 mg

## 2018-01-22 ENCOUNTER — Other Ambulatory Visit: Payer: Self-pay | Admitting: Rheumatology

## 2018-01-25 ENCOUNTER — Ambulatory Visit: Payer: Medicare Other | Admitting: Rheumatology

## 2018-02-15 ENCOUNTER — Telehealth: Payer: Self-pay | Admitting: Rheumatology

## 2018-02-15 DIAGNOSIS — M3390 Dermatopolymyositis, unspecified, organ involvement unspecified: Secondary | ICD-10-CM

## 2018-02-15 NOTE — Telephone Encounter (Signed)
[facility] request a refill on Prednisone 1mg , and Prednisone 5mg  for patient. Please call to advise.

## 2018-02-16 MED ORDER — PREDNISONE 1 MG PO TABS: ORAL_TABLET | ORAL | 0 refills | Status: DC

## 2018-02-16 NOTE — Telephone Encounter (Signed)
Patient is on a monthly prednisone taper. She is currently on 4mg  daily for the month of May and will decrease to 3mg  daily for the month of June.   Clarified pharmacy with patient since [facility] and patient states she is changing pharmacies. I advised patient the prescription for 3mg  of prednisone daily will be sent to [facility].   Last visit: 08/25/2017 Next visit: message sent to the front desk to schedule patient.   Okay to refill per Dr. Estanislado Pandy.

## 2018-02-22 ENCOUNTER — Other Ambulatory Visit: Payer: Self-pay | Admitting: Rheumatology

## 2018-02-22 DIAGNOSIS — M3390 Dermatopolymyositis, unspecified, organ involvement unspecified: Secondary | ICD-10-CM

## 2018-04-06 NOTE — Progress Notes (Signed)
Office Visit Note  Patient: Carmen Woods             Date of Birth: 09/05/1956           MRN: [id number]             PCP: Glenda Chroman, MD Referring: Glenda Chroman, MD Visit Date: 04/19/2018 Occupation: @GUAROCC @  Subjective:  Pain in right knee and bilateral ankles.   History of Present Illness: Carmen Woods is a 62 y.o. female with history of dermatomyositis, Sjogren's and osteoarthritis.  She states she has been having increased discomfort in her right knee and both ankles.  She notices intermittent swelling in her ankles.  He continues to have dry mouth.  She experiences some muscle weakness but no muscle pain.  Lower back pain is better.   Activities of Daily Living:  Patient reports morning stiffness for 2 hours.   Patient Denies nocturnal pain.  Difficulty dressing/grooming: Denies Difficulty climbing stairs: Reports Difficulty getting out of chair: Reports Difficulty using hands for taps, buttons, cutlery, and/or writing: Denies  Review of Systems  Constitutional: Positive for fever. Negative for fatigue, night sweats, weight gain and weight loss.  HENT: Positive for mouth dryness. Negative for mouth sores, trouble swallowing, trouble swallowing and nose dryness.   Eyes: Positive for dryness. Negative for pain, redness and visual disturbance.  Respiratory: Negative for cough, shortness of breath and difficulty breathing.   Cardiovascular: Negative for chest pain, palpitations, hypertension, irregular heartbeat and swelling in legs/feet.  Gastrointestinal: Negative for blood in stool, constipation and diarrhea.  Endocrine: Negative for increased urination.  Genitourinary: Negative for vaginal dryness.  Musculoskeletal: Positive for arthralgias, joint pain, muscle weakness and morning stiffness. Negative for joint swelling, myalgias, muscle tenderness and myalgias.  Skin: Positive for sensitivity to sunlight. Negative for color change, rash, hair loss,  skin tightness and ulcers.  Allergic/Immunologic: Negative for susceptible to infections.  Neurological: Negative for dizziness, memory loss, night sweats and weakness.  Hematological: Negative for swollen glands.  Psychiatric/Behavioral: Positive for depressed mood. Negative for sleep disturbance. The patient is nervous/anxious.     PMFS History:  Patient Active Problem List   Diagnosis Date Noted  . Basal cell carcinoma (BCC) 08/25/2017  . Primary osteoarthritis of both hands 03/18/2017  . Primary osteoarthritis of both feet 03/18/2017  . High risk medication use 11/10/2016  . Galtron's Papules 11/10/2016  . Other fatigue 11/10/2016  . Spondylosis of lumbar region without myelopathy or radiculopathy 11/10/2016  . History of anxiety 11/10/2016  . Vitamin D deficiency 11/10/2016  . History of vertigo 11/10/2016  . Sjogren's syndrome with keratoconjunctivitis sicca ([place]) 11/10/2016  . History of hypothyroidism 11/10/2016  . Dermatomyositis ([place]) 01/07/2015  . Mitral regurgitation 01/19/2013  . Precordial pain 01/19/2013    Past Medical History:  Diagnosis Date  . DDD (degenerative disc disease), lumbar   . Dermatomyositis ([place]) 12/2014  . Difficulty swallowing   . Dry eyes   . Dry mouth   . Hypothyroidism   . Mass of left thigh 12/2014  . Rash    hands, left thigh    Family History  Problem Relation Age of Onset  . Heart Problems Mother        Pacermaker  . Breast cancer Mother   . Diabetes Father   . Osteoarthritis Sister   . Kidney disease Sister 36  . Rheum arthritis Sister   . Thyroid disease Sister    Past Surgical History:  Procedure Laterality Date  .  BACK SURGERY  1998  . BELPHAROPTOSIS REPAIR Bilateral 11/2014  . MUSCLE BIOPSY Left 01/07/2015   Procedure: LEFT THIGH MUSCLE BIOPSY;  Surgeon: Fanny Skates, MD;  Location: [facility];  Service: General;  Laterality: Left;  . skin cancer removal   07/2017   Social History   Social History  Narrative  . Not on file    Objective: Vital Signs: BP 129/75 (BP Location: Left Arm, Patient Position: Sitting, Cuff Size: Normal)   Pulse 68   Resp 15   Ht 5\' 8"  (1.727 m)   Wt 187 lb (84.8 kg)   BMI 28.43 kg/m    Physical Exam  Constitutional: She is oriented to person, place, and time. She appears well-developed and well-nourished.  HENT:  Head: Normocephalic and atraumatic.  Eyes: Conjunctivae and EOM are normal.  Neck: Normal range of motion.  Cardiovascular: Normal rate, regular rhythm, normal heart sounds and intact distal pulses.  Pulmonary/Chest: Effort normal and breath sounds normal.  Abdominal: Soft. Bowel sounds are normal.  Musculoskeletal:  No muscular weakness or tenderness was noted.  Lymphadenopathy:    She has no cervical adenopathy.  Neurological: She is alert and oriented to person, place, and time.  Skin: Skin is warm and dry. Capillary refill takes less than 2 seconds.  Psychiatric: She has a normal mood and affect. Her behavior is normal.  Nursing note and vitals reviewed.    Musculoskeletal Exam: C-spine thoracic lumbar spine good range of motion.  Shoulder joints elbow joints wrist joints are good range of motion.  She has DIP and PIP thickening in her hands and feet with no synovitis.  Hip joints knee joints ankles were in good range of motion with no synovitis.  She had no muscular weakness or tenderness on examination today.  CDAI Exam: No CDAI exam completed.   Investigation: No additional findings.  Imaging: No results found.  Recent Labs: Lab Results  Component Value Date   WBC 6.5 01/13/2018   HGB 12.8 01/13/2018   PLT 268 01/13/2018   NA 142 01/13/2018   K 4.1 01/13/2018   CL 106 01/13/2018   CO2 31 01/13/2018   GLUCOSE 122 (H) 01/13/2018   BUN 9 01/13/2018   CREATININE 0.68 01/13/2018   BILITOT 0.6 01/13/2018   ALKPHOS 99 03/22/2017   AST 23 01/13/2018   ALT 19 01/13/2018   PROT 6.5 01/13/2018   ALBUMIN 4.3 03/22/2017    CALCIUM 9.5 01/13/2018   GFRAA 109 01/13/2018    Speciality Comments: No specialty comments available.  Procedures:  No procedures performed Allergies: Codeine and Hydrocodone   Assessment / Plan:     Visit Diagnoses: Dermatomyositis ([place]) - Positive biopsy May 2016, Ro 52 positive, elevated CK and aldolase, Gottron's papules, muscle weakness, fatigue, positive Ro, positive anti-chromatin -patient reports that she has been experiencing increased muscle stiffness and fatigue.  I did not notice any muscle weakness or tenderness on examination.  Plan: CK  Sjogren's syndrome with keratoconjunctivitis sicca ([facility])-she continues to have sicca symptoms.  She has been using over-the-counter products and Restasis.  High risk medication use - Imuran 50 mg, 2 tablets p.o. daily, prednisone 2 g p.o. daily - Plan: CBC with Differential/Platelet, COMPLETE METABOLIC PANEL WITH GFR  Primary osteoarthritis of both hands-she has stiffness in her hands and feet but no inflammation.  Primary osteoarthritis of both feet  Spondylosis of lumbar region without myelopathy or radiculopathy-she been having off-and-on discomfort in her lower back.  Basal cell carcinoma (BCC), unspecified  site  Other fatigue  History of anxiety -she is on Lexapro   History of vertigo  History of hypothyroidism   Orders: Orders Placed This Encounter  Procedures  . CBC with Differential/Platelet  . COMPLETE METABOLIC PANEL WITH GFR  . CK   No orders of the defined types were placed in this encounter.   Face-to-face time spent with patient was 30 minutes. Greater than 50% of time was spent in counseling and coordination of care.  Follow-Up Instructions: Return in about 5 months (around 09/19/2018) for DM, , Sjogren's, Osteoarthritis.   Bo Merino, MD  Note - This record has been created using Editor, commissioning.  Chart creation errors have been sought, but may not always  have been located. Such creation errors  do not reflect on  the standard of medical care.

## 2018-04-14 ENCOUNTER — Telehealth: Payer: Self-pay | Admitting: Rheumatology

## 2018-04-14 NOTE — Telephone Encounter (Signed)
Attempted to contact the patient and left message for patient to call the office.  

## 2018-04-14 NOTE — Telephone Encounter (Signed)
Patient called stating she has an appointment with Dr. Estanislado Pandy on 04/19/18 and is asking if she should have her bloodwork done before her appointment.  Patient requested a return call.

## 2018-04-19 ENCOUNTER — Encounter: Payer: Self-pay | Admitting: Rheumatology

## 2018-04-19 ENCOUNTER — Ambulatory Visit (INDEPENDENT_AMBULATORY_CARE_PROVIDER_SITE_OTHER): Payer: MEDICARE | Admitting: Rheumatology

## 2018-04-19 VITALS — BP 129/75 | HR 68 | Resp 15 | Ht 68.0 in | Wt 187.0 lb

## 2018-04-19 DIAGNOSIS — M3390 Dermatopolymyositis, unspecified, organ involvement unspecified: Secondary | ICD-10-CM

## 2018-04-19 DIAGNOSIS — C4491 Basal cell carcinoma of skin, unspecified: Secondary | ICD-10-CM

## 2018-04-19 DIAGNOSIS — M47816 Spondylosis without myelopathy or radiculopathy, lumbar region: Secondary | ICD-10-CM

## 2018-04-19 DIAGNOSIS — Z8639 Personal history of other endocrine, nutritional and metabolic disease: Secondary | ICD-10-CM

## 2018-04-19 DIAGNOSIS — M19072 Primary osteoarthritis, left ankle and foot: Secondary | ICD-10-CM

## 2018-04-19 DIAGNOSIS — M19071 Primary osteoarthritis, right ankle and foot: Secondary | ICD-10-CM

## 2018-04-19 DIAGNOSIS — M19042 Primary osteoarthritis, left hand: Secondary | ICD-10-CM

## 2018-04-19 DIAGNOSIS — Z8659 Personal history of other mental and behavioral disorders: Secondary | ICD-10-CM

## 2018-04-19 DIAGNOSIS — Z79899 Other long term (current) drug therapy: Secondary | ICD-10-CM

## 2018-04-19 DIAGNOSIS — Z87898 Personal history of other specified conditions: Secondary | ICD-10-CM

## 2018-04-19 DIAGNOSIS — M19041 Primary osteoarthritis, right hand: Secondary | ICD-10-CM | POA: Diagnosis not present

## 2018-04-19 DIAGNOSIS — M3501 Sicca syndrome with keratoconjunctivitis: Secondary | ICD-10-CM | POA: Diagnosis not present

## 2018-04-19 DIAGNOSIS — R5383 Other fatigue: Secondary | ICD-10-CM

## 2018-04-19 LAB — CBC WITH DIFFERENTIAL/PLATELET
Basophils Absolute: 28 cells/uL (ref 0–200)
Basophils Relative: 0.6 %
EOS PCT: 2.6 %
Eosinophils Absolute: 120 cells/uL (ref 15–500)
HEMATOCRIT: 34.4 % — AB (ref 35.0–45.0)
Hemoglobin: 11.8 g/dL (ref 11.7–15.5)
LYMPHS ABS: 805 {cells}/uL — AB (ref 850–3900)
MCH: 29.6 pg (ref 27.0–33.0)
MCHC: 34.3 g/dL (ref 32.0–36.0)
MCV: 86.2 fL (ref 80.0–100.0)
MPV: 9.9 fL (ref 7.5–12.5)
Monocytes Relative: 9.7 %
NEUTROS ABS: 3202 {cells}/uL (ref 1500–7800)
NEUTROS PCT: 69.6 %
Platelets: 237 10*3/uL (ref 140–400)
RBC: 3.99 10*6/uL (ref 3.80–5.10)
RDW: 12.4 % (ref 11.0–15.0)
Total Lymphocyte: 17.5 %
WBC mixed population: 446 cells/uL (ref 200–950)
WBC: 4.6 10*3/uL (ref 3.8–10.8)

## 2018-04-19 LAB — COMPLETE METABOLIC PANEL WITH GFR
AG RATIO: 2 (calc) (ref 1.0–2.5)
ALT: 19 U/L (ref 6–29)
AST: 28 U/L (ref 10–35)
Albumin: 4.3 g/dL (ref 3.6–5.1)
Alkaline phosphatase (APISO): 75 U/L (ref 33–130)
BUN: 7 mg/dL (ref 7–25)
CALCIUM: 9.4 mg/dL (ref 8.6–10.4)
CO2: 31 mmol/L (ref 20–32)
CREATININE: 0.75 mg/dL (ref 0.50–0.99)
Chloride: 107 mmol/L (ref 98–110)
GFR, EST AFRICAN AMERICAN: 100 mL/min/{1.73_m2} (ref >=60)
GFR, EST NON AFRICAN AMERICAN: 86 mL/min/{1.73_m2} (ref >=60)
Globulin: 2.2 g/dL (calc) (ref 1.9–3.7)
Glucose, Bld: 103 mg/dL — ABNORMAL HIGH (ref 65–99)
POTASSIUM: 4.2 mmol/L (ref 3.5–5.3)
Sodium: 144 mmol/L (ref 135–146)
Total Bilirubin: 0.5 mg/dL (ref 0.2–1.2)
Total Protein: 6.5 g/dL (ref 6.1–8.1)

## 2018-04-19 LAB — CK: CK TOTAL: 101 U/L (ref 29–143)

## 2018-04-20 NOTE — Progress Notes (Signed)
Labs are stable.

## 2018-05-04 ENCOUNTER — Other Ambulatory Visit: Payer: Self-pay | Admitting: Rheumatology

## 2018-05-04 DIAGNOSIS — M3390 Dermatopolymyositis, unspecified, organ involvement unspecified: Secondary | ICD-10-CM

## 2018-05-04 NOTE — Telephone Encounter (Signed)
Last visit: 04/19/2018 Next visit: message sent to the front desk to schedule appt.   Okay to refill per Dr. Estanislado Pandy.

## 2018-06-10 ENCOUNTER — Telehealth: Payer: Self-pay | Admitting: Rheumatology

## 2018-06-10 MED ORDER — PREDNISONE 5 MG PO TABS: 20.0000 mg | ORAL_TABLET | Freq: Every day | ORAL | 0 refills | Status: DC

## 2018-06-10 NOTE — Telephone Encounter (Signed)
Patient called stating she was bitten by a bug on 05/28/18 and started to break out in a rash all over her body.  Patient states she went to the dermatologist Dr. Tarri Glenn who told her to increase her Prednisone to 20 mg per day for 7 days and then he will reevaluate her.  Patient states she has been taking four pills per day / 5 mg tablets since Wednesday and is running out of medication.  Patient requested a prescription refill of Prednisone 5 mg to be sent to [facility] on [address].

## 2018-06-10 NOTE — Telephone Encounter (Signed)
Okay to refill prednisone early

## 2018-06-10 NOTE — Telephone Encounter (Signed)
Last Visit: 04/19/18 Next visit: 08/24/18    Patient called stating she was bitten by a bug on 05/28/18 and started to break out in a rash all over her body.  Patient states she went to the dermatologist Dr. Tarri Glenn who told her to increase her Prednisone to 20 mg per day for 7 days and then he will reevaluate her.  Patient states she has been taking four pills per day / 5 mg tablets since Wednesday and is running out of medication.  Patient requested a prescription refill of Prednisone 5 mg to be sent to [facility] on [address].  We currently have patient on Prednisone 2 mg daily. Okay to send in increased dies of prednisone?

## 2018-07-20 ENCOUNTER — Telehealth: Payer: Self-pay | Admitting: Rheumatology

## 2018-07-20 NOTE — Telephone Encounter (Signed)
Patient advised to take her normal dose of Imuran, not to double up. Patient verbalzied understanding.

## 2018-07-20 NOTE — Telephone Encounter (Signed)
Patient states she missed her Imuran dose yesterday. Patient needs to know if she needs to double up, or just take normal dose today. Patient advised not to do anything until she hears back from [place]. Please advise patient.

## 2018-08-10 NOTE — Progress Notes (Signed)
Office Visit Note  Patient: Carmen Woods             Date of Birth: 20-Aug-1956           MRN: [id number]             PCP: Glenda Chroman, MD Referring: Glenda Chroman, MD Visit Date: 08/24/2018 Occupation: @GUAROCC @  Subjective:  Fatigue    History of Present Illness: Carmen Woods is a 62 y.o. female with history of dermatomyositis, Sjogren's and osteoarthritis.  She is on Imuran 100 mg daily and prednisone 1 mg daily.  She states her fatigue has worsening lately.  She states she has only been sleeping about 4 hours per night.  She has very interrupted sleep at night due to listening out for her husband who has parkinson's.  She has not been taking Ambien due to worrying she won't wake up if her husband needs her. She states she has been having increased pain in both knee joints.  She has intermittent pain and swelling in both hands.  She denies any muscle weakness at this time.  She denies any recent rashes.  She is interested in tapering off of prednisone.   Activities of Daily Living:  Patient reports morning stiffness all day.   Patient Denies nocturnal pain.  Difficulty dressing/grooming: Denies Difficulty climbing stairs: Reports Difficulty getting out of chair: Reports Difficulty using hands for taps, buttons, cutlery, and/or writing: Reports  Review of Systems  Constitutional: Positive for fatigue.  HENT: Positive for mouth dryness. Negative for mouth sores and nose dryness.   Eyes: Positive for dryness. Negative for pain and visual disturbance.  Respiratory: Negative for cough, hemoptysis, shortness of breath and difficulty breathing.   Cardiovascular: Negative for chest pain, palpitations, hypertension and swelling in legs/feet.  Gastrointestinal: Negative for blood in stool, constipation and diarrhea.  Endocrine: Negative for increased urination.  Genitourinary: Negative for difficulty urinating and painful urination.  Musculoskeletal: Positive for  arthralgias, joint pain, joint swelling, muscle weakness, morning stiffness and muscle tenderness. Negative for myalgias and myalgias.  Skin: Negative for color change, pallor, rash, hair loss, nodules/bumps, skin tightness, ulcers and sensitivity to sunlight.  Allergic/Immunologic: Negative for susceptible to infections.  Neurological: Negative for dizziness, headaches and weakness.  Hematological: Negative for bruising/bleeding tendency and swollen glands.  Psychiatric/Behavioral: Positive for sleep disturbance. Negative for depressed mood. The patient is not nervous/anxious.     PMFS History:  Patient Active Problem List   Diagnosis Date Noted  . Basal cell carcinoma (BCC) 08/25/2017  . Primary osteoarthritis of both hands 03/18/2017  . Primary osteoarthritis of both feet 03/18/2017  . High risk medication use 11/10/2016  . Galtron's Papules 11/10/2016  . Other fatigue 11/10/2016  . Spondylosis of lumbar region without myelopathy or radiculopathy 11/10/2016  . History of anxiety 11/10/2016  . Vitamin D deficiency 11/10/2016  . History of vertigo 11/10/2016  . Sjogren's syndrome with keratoconjunctivitis sicca ([place]) 11/10/2016  . History of hypothyroidism 11/10/2016  . Dermatomyositis ([place]) 01/07/2015  . Mitral regurgitation 01/19/2013  . Precordial pain 01/19/2013    Past Medical History:  Diagnosis Date  . DDD (degenerative disc disease), lumbar   . Dermatomyositis ([place]) 12/2014  . Difficulty swallowing   . Dry eyes   . Dry mouth   . Hypothyroidism   . Mass of left thigh 12/2014  . Rash    hands, left thigh    Family History  Problem Relation Age of Onset  . Heart Problems  Mother        Industrial/product designer  . Breast cancer Mother   . Diabetes Father   . Osteoarthritis Sister   . Kidney disease Sister 62  . Rheum arthritis Sister   . Thyroid disease Sister    Past Surgical History:  Procedure Laterality Date  . BACK SURGERY  1998  . BELPHAROPTOSIS REPAIR Bilateral 11/2014    . MUSCLE BIOPSY Left 01/07/2015   Procedure: LEFT THIGH MUSCLE BIOPSY;  Surgeon: Fanny Skates, MD;  Location: [facility];  Service: General;  Laterality: Left;  . skin cancer removal   07/2017   Social History   Social History Narrative  . Not on file    Objective: Vital Signs: BP 138/68 (BP Location: Left Arm, Patient Position: Sitting, Cuff Size: Normal)   Pulse 65   Resp 16   Ht 5\' 8"  (1.727 m)   Wt 186 lb (84.4 kg)   BMI 28.28 kg/m    Physical Exam  Constitutional: She is oriented to person, place, and time. She appears well-developed and well-nourished.  HENT:  Head: Normocephalic and atraumatic.  Eyes: Conjunctivae and EOM are normal.  Neck: Normal range of motion.  Cardiovascular: Normal rate, regular rhythm, normal heart sounds and intact distal pulses.  Pulmonary/Chest: Effort normal and breath sounds normal.  Abdominal: Soft. Bowel sounds are normal.  Lymphadenopathy:    She has no cervical adenopathy.  Neurological: She is alert and oriented to person, place, and time.  Skin: Skin is warm and dry. Capillary refill takes less than 2 seconds.  Psychiatric: She has a normal mood and affect. Her behavior is normal.  Nursing note and vitals reviewed.    Musculoskeletal Exam: C-spine limited ROM with lateral rotation.  Good flexion and extension.  Thoracic and lumbar spine good ROM.  Shoulder joints, elbow joints, wrist joints, MCPs, PIPs and DIPs good ROM with no synovitis.  PIP and DIP synovial thickening consistent with osteoarthritis.  Incomplete fist formation. Hip joints, knee joints, ankle joints, MTPs, PIPs, and DIPs good ROM with no synovitis.  No warmth or effusion of knee joints.  No tenderness or swelling of ankle joints.  Full strength of upper and lower extremities.   CDAI Exam: CDAI Score: Not documented Patient Global Assessment: Not documented; Provider Global Assessment: Not documented Swollen: Not documented; Tender: Not  documented Joint Exam   Not documented   There is currently no information documented on the homunculus. Go to the Rheumatology activity and complete the homunculus joint exam.  Investigation: No additional findings.  Imaging: No results found.  Recent Labs: Lab Results  Component Value Date   WBC 4.6 04/19/2018   HGB 11.8 04/19/2018   PLT 237 04/19/2018   NA 144 04/19/2018   K 4.2 04/19/2018   CL 107 04/19/2018   CO2 31 04/19/2018   GLUCOSE 103 (H) 04/19/2018   BUN 7 04/19/2018   CREATININE 0.75 04/19/2018   BILITOT 0.5 04/19/2018   ALKPHOS 99 03/22/2017   AST 28 04/19/2018   ALT 19 04/19/2018   PROT 6.5 04/19/2018   ALBUMIN 4.3 03/22/2017   CALCIUM 9.4 04/19/2018   GFRAA 100 04/19/2018    Speciality Comments: No specialty comments available.  Procedures:  No procedures performed Allergies: Codeine and Hydrocodone     Assessment / Plan:     Visit Diagnoses: Dermatomyositis ([place]) - Positive biopsy May 2016, Ro 52 positive, elevated CK and aldolase, Gottron's papules, muscle weakness, fatigue, positive Ro, positive anti-chromatin: She has no muscle weakness  or muscle tenderness at this time.  She has no difficulty getting up from a chair or raising her arms above her head.  She is on Imuran 100 mg po daily and Prednisone 1 mg po daily. Most recent CK was 101 on 04/19/18.  We will recheck CK and sed rate today.  She will start tapering Prednisone to 1 mg every other day until her return office visit in 5 months.  She was advised to notify us if she develops increased muscle weakness. - Plan: CK, Sedimentation rate  Sjogren's syndrome with keratoconjunctivitis sicca ([place]) - She continues to have sicca symptoms.  She has no parotid swelling on exam. She uses OTC products and Restasis. We will check a CBC, CMP, and UA today. Plan: Urinalysis, Routine w reflex microscopic  High risk medication use -Current regimen includes Imuran 100 mg daily and prednisone 1 mg daily.  Most  recent CBC/CMP stable on 04/19/2018.  Due for CBC/CMP today and then every 3 months to monitor for drug toxicity.  Standing orders are in place.  - Plan: CBC with Differential/Platelet, COMPLETE METABOLIC PANEL WITH GFR  Primary osteoarthritis of both hands: She has PIP and DIP synovial thickening.  She has no synovitis on exam.  She has difficulty with complete fist formation.  Joint protection and muscle strengthening were discussed.   Primary osteoarthritis of both feet: She has no discomfort in her feet at this time.  She wears proper fitting shoes.   Spondylosis of lumbar region without myelopathy or radiculopathy: She has good ROM with no midline spinal tenderness.    Other fatigue: She has chronic fatigue.  Her level of fatigue has been worsening recently due to only sleeping about 4 hours per night.  She has interrupted sleep at night due to having to wake up with assist her husband who has parkinson's in the night.    History of hypothyroidism: TSH was WNL on 01/13/18.   Other medical conditions are listed as follows:   Basal cell carcinoma (BCC), unspecified site  History of anxiety - She is on Lexapro   History of vertigo   Orders: Orders Placed This Encounter  Procedures  . CBC with Differential/Platelet  . COMPLETE METABOLIC PANEL WITH GFR  . CK  . Urinalysis, Routine w reflex microscopic  . Sedimentation rate   No orders of the defined types were placed in this encounter.    Follow-Up Instructions: Return in about 5 months (around 01/23/2019) for Dermatomyositis, Sjogren's syndrome, Osteoarthritis.   Ofilia Neas, PA-C   I examined and evaluated the patient with Hazel Sams PA.  Patient complains of fatigue and arthralgias.  On my examination she had no synovitis.  Clinical findings were consistent with osteoarthritis.  She has been also experiencing increased fatigue and she is not getting enough rest at night.  We will obtain labs as mentioned above.  Her CK has  been stable.  We also discussed prednisone taper.  The plan of care was discussed as noted above.  Bo Merino, MD  Note - This record has been created using Editor, commissioning.  Chart creation errors have been sought, but may not always  have been located. Such creation errors do not reflect on  the standard of medical care.

## 2018-08-15 ENCOUNTER — Other Ambulatory Visit: Payer: Self-pay | Admitting: Rheumatology

## 2018-08-15 ENCOUNTER — Telehealth: Payer: Self-pay | Admitting: Rheumatology

## 2018-08-15 DIAGNOSIS — M3390 Dermatopolymyositis, unspecified, organ involvement unspecified: Secondary | ICD-10-CM

## 2018-08-15 MED ORDER — PREDNISONE 1 MG PO TABS: ORAL_TABLET | ORAL | 0 refills | Status: DC

## 2018-08-15 NOTE — Telephone Encounter (Signed)
Patient called requesting prescription refill of Prednisone 1 mg tablet to be sent to [facility] on [address]  Patient states she took her last pills today and is requesting the prescription be sent tomorrow 08/16/18.

## 2018-08-15 NOTE — Telephone Encounter (Signed)
Last Visit: 04/19/2018 Next Visit: 08/24/2018  Patient states she is currently on 1mg  of prednisone daily.   Okay to refill per Dr. Estanislado Pandy.

## 2018-08-24 ENCOUNTER — Ambulatory Visit: Payer: Medicare HMO | Admitting: Rheumatology

## 2018-08-24 ENCOUNTER — Encounter: Payer: Self-pay | Admitting: Rheumatology

## 2018-08-24 VITALS — BP 138/68 | HR 65 | Resp 16 | Ht 68.0 in | Wt 186.0 lb

## 2018-08-24 DIAGNOSIS — M3390 Dermatopolymyositis, unspecified, organ involvement unspecified: Secondary | ICD-10-CM | POA: Diagnosis not present

## 2018-08-24 DIAGNOSIS — Z8659 Personal history of other mental and behavioral disorders: Secondary | ICD-10-CM

## 2018-08-24 DIAGNOSIS — M19072 Primary osteoarthritis, left ankle and foot: Secondary | ICD-10-CM

## 2018-08-24 DIAGNOSIS — M3501 Sicca syndrome with keratoconjunctivitis: Secondary | ICD-10-CM

## 2018-08-24 DIAGNOSIS — M19042 Primary osteoarthritis, left hand: Secondary | ICD-10-CM

## 2018-08-24 DIAGNOSIS — M19041 Primary osteoarthritis, right hand: Secondary | ICD-10-CM

## 2018-08-24 DIAGNOSIS — M19071 Primary osteoarthritis, right ankle and foot: Secondary | ICD-10-CM

## 2018-08-24 DIAGNOSIS — Z79899 Other long term (current) drug therapy: Secondary | ICD-10-CM

## 2018-08-24 DIAGNOSIS — Z8639 Personal history of other endocrine, nutritional and metabolic disease: Secondary | ICD-10-CM

## 2018-08-24 DIAGNOSIS — R5383 Other fatigue: Secondary | ICD-10-CM

## 2018-08-24 DIAGNOSIS — M47816 Spondylosis without myelopathy or radiculopathy, lumbar region: Secondary | ICD-10-CM

## 2018-08-24 DIAGNOSIS — C4491 Basal cell carcinoma of skin, unspecified: Secondary | ICD-10-CM

## 2018-08-24 DIAGNOSIS — Z87898 Personal history of other specified conditions: Secondary | ICD-10-CM

## 2018-08-25 LAB — CBC WITH DIFFERENTIAL/PLATELET
Basophils Absolute: 50 cells/uL (ref 0–200)
Basophils Relative: 1 %
Eosinophils Absolute: 310 cells/uL (ref 15–500)
Eosinophils Relative: 6.2 %
HCT: 37.6 % (ref 35.0–45.0)
Hemoglobin: 12.8 g/dL (ref 11.7–15.5)
Lymphs Abs: 700 cells/uL — ABNORMAL LOW (ref 850–3900)
MCH: 29.6 pg (ref 27.0–33.0)
MCHC: 34 g/dL (ref 32.0–36.0)
MCV: 86.8 fL (ref 80.0–100.0)
MPV: 10 fL (ref 7.5–12.5)
Monocytes Relative: 8.6 %
Neutro Abs: 3510 cells/uL (ref 1500–7800)
Neutrophils Relative %: 70.2 %
Platelets: 236 10*3/uL (ref 140–400)
RBC: 4.33 10*6/uL (ref 3.80–5.10)
RDW: 12.3 % (ref 11.0–15.0)
Total Lymphocyte: 14 %
WBC mixed population: 430 cells/uL (ref 200–950)
WBC: 5 10*3/uL (ref 3.8–10.8)

## 2018-08-25 LAB — URINALYSIS, ROUTINE W REFLEX MICROSCOPIC
Bilirubin Urine: NEGATIVE
Glucose, UA: NEGATIVE
Hgb urine dipstick: NEGATIVE
Ketones, ur: NEGATIVE
LEUKOCYTES UA: NEGATIVE
Nitrite: NEGATIVE
Protein, ur: NEGATIVE
Specific Gravity, Urine: 1.006 (ref 1.001–1.03)
pH: 7 (ref 5.0–8.0)

## 2018-08-25 LAB — SEDIMENTATION RATE: Sed Rate: 11 mm/h (ref 0–30)

## 2018-08-25 LAB — COMPLETE METABOLIC PANEL WITH GFR
AG Ratio: 1.7 (calc) (ref 1.0–2.5)
ALT: 16 U/L (ref 6–29)
AST: 26 U/L (ref 10–35)
Albumin: 4.3 g/dL (ref 3.6–5.1)
Alkaline phosphatase (APISO): 80 U/L (ref 33–130)
BUN: 8 mg/dL (ref 7–25)
CHLORIDE: 104 mmol/L (ref 98–110)
CO2: 31 mmol/L (ref 20–32)
Calcium: 9.8 mg/dL (ref 8.6–10.4)
Creat: 0.68 mg/dL (ref 0.50–0.99)
GFR, Est African American: 109 mL/min/{1.73_m2} (ref >=60)
GFR, Est Non African American: 94 mL/min/{1.73_m2} (ref >=60)
Globulin: 2.5 g/dL (calc) (ref 1.9–3.7)
Glucose, Bld: 96 mg/dL (ref 65–99)
Potassium: 4.1 mmol/L (ref 3.5–5.3)
Sodium: 141 mmol/L (ref 135–146)
Total Bilirubin: 0.7 mg/dL (ref 0.2–1.2)
Total Protein: 6.8 g/dL (ref 6.1–8.1)

## 2018-08-25 LAB — CK: Total CK: 119 U/L (ref 29–143)

## 2018-08-25 NOTE — Progress Notes (Signed)
Labs are WNL.

## 2018-09-21 HISTORY — PX: OTHER SURGICAL HISTORY: SHX169

## 2018-10-03 ENCOUNTER — Other Ambulatory Visit: Payer: Self-pay | Admitting: Rheumatology

## 2018-10-03 DIAGNOSIS — M3390 Dermatopolymyositis, unspecified, organ involvement unspecified: Secondary | ICD-10-CM

## 2018-10-03 NOTE — Telephone Encounter (Signed)
Last Visit: 08/24/18 Next Visit: 01/24/19  Okay to refill per Dr. Deveshwar  

## 2018-11-29 ENCOUNTER — Other Ambulatory Visit: Payer: Self-pay | Admitting: Rheumatology

## 2018-11-29 DIAGNOSIS — M3390 Dermatopolymyositis, unspecified, organ involvement unspecified: Secondary | ICD-10-CM

## 2018-11-29 NOTE — Telephone Encounter (Signed)
Last Visit: 08/24/18 Next Visit: 01/24/19  Okay to refill per Dr. Deveshwar  

## 2018-12-28 ENCOUNTER — Other Ambulatory Visit: Payer: Self-pay | Admitting: Rheumatology

## 2018-12-28 DIAGNOSIS — M3390 Dermatopolymyositis, unspecified, organ involvement unspecified: Secondary | ICD-10-CM

## 2018-12-28 NOTE — Telephone Encounter (Signed)
Last Visit: 08/24/18 Next Visit: 01/24/19  Okay to refill per Dr. Estanislado Pandy

## 2019-01-16 ENCOUNTER — Other Ambulatory Visit: Payer: Self-pay | Admitting: Rheumatology

## 2019-01-16 ENCOUNTER — Telehealth: Payer: Self-pay | Admitting: Rheumatology

## 2019-01-16 DIAGNOSIS — M3390 Dermatopolymyositis, unspecified, organ involvement unspecified: Secondary | ICD-10-CM

## 2019-01-16 NOTE — Telephone Encounter (Signed)
Last Visit: 08/24/2018 Next Visit: 01/24/2019  Okay to refill per Dr. Estanislado Pandy.

## 2019-01-16 NOTE — Telephone Encounter (Signed)
Patient called requesting prescription refill of Prednisone 1 mg tablet to be sent to [facility].  Patient is requesting a return call regarding her prescription of Imuran.

## 2019-01-17 ENCOUNTER — Other Ambulatory Visit: Payer: Self-pay | Admitting: Rheumatology

## 2019-01-17 DIAGNOSIS — M3390 Dermatopolymyositis, unspecified, organ involvement unspecified: Secondary | ICD-10-CM

## 2019-01-17 DIAGNOSIS — M3501 Sicca syndrome with keratoconjunctivitis: Secondary | ICD-10-CM

## 2019-01-17 DIAGNOSIS — Z79899 Other long term (current) drug therapy: Secondary | ICD-10-CM

## 2019-01-17 NOTE — Telephone Encounter (Signed)
Last Visit: 08/24/2018 Next Visit: 01/24/2019 Labs: 08/24/2018 WNL   Patient will be getting [facility] drawn [facility] at [facility] [address].  Lab orders have been released.   Okay to refill per Dr. Estanislado Pandy.

## 2019-01-17 NOTE — Telephone Encounter (Signed)
Prescriptions have been sent to [facility] and patient is aware. Patient will be getting labs drawn today at [facility] in [place]. Labs have been released.

## 2019-01-17 NOTE — Progress Notes (Signed)
Virtual Visit via Telephone Note  I connected with Carmen Woods on 01/17/19 at 11:15 AM EDT by telephone and verified that I am speaking with the correct person using two identifiers. I discussed the limitations, risks, security and privacy concerns of performing an evaluation and management service by telephone and the availability of in person appointments. I also discussed with the patient that there may be a patient responsible charge related to this service. The patient expressed understanding and agreed to proceed. This service was conducted via virtual visit. She was unable to use Webex, so we reached her by telephone. The patient was located at home. I was located in my office.  Consent was obtained prior to the virtual visit and is aware of possible charges through their insurance for this visit.  The patient is an established patient.  Dr. Estanislado Pandy, MD conducted the virtual visit and Hazel Sams, PA-C acted as scribe during the service.  Office staff helped with scheduling follow up visits after the service was conducted.   CC: Fatigue   History of Present Illness: Patient is a 63 year old female with a past medical history of dermatomyositis and osteoarthritis.  She takes Imuran 100 mg po daily and prednisone 1 mg po every other daily. She has not had any recent signs or symptoms of a flare.  She has no muscle tenderness or muscle weakness. She is experiencing pain in the right hand but no joint swelling.  She reports her husband passed away 11/08/18, and she continues to suffer from depression.  She has difficulty wanting to get up in the morning.  She has had increased fatigue, which she attributes to depression.   Review of Systems  Constitutional: Positive for malaise/fatigue. Negative for fever.  HENT:       +Dry mouth   Eyes: Negative for photophobia, pain, discharge and redness.       +Dry eyes  Respiratory: Negative for cough, shortness of breath and wheezing.    Cardiovascular: Negative for chest pain and palpitations.  Gastrointestinal: Negative for blood in stool, constipation and diarrhea.  Genitourinary: Negative for dysuria.  Musculoskeletal: Positive for joint pain. Negative for back pain, myalgias and neck pain.  Skin: Negative for rash.  Neurological: Negative for dizziness and headaches.  Psychiatric/Behavioral: Positive for depression. The patient is not nervous/anxious and does not have insomnia.      Observations/Objective: Physical Exam  Constitutional: She is oriented to person, place, and time.  Neurological: She is alert and oriented to person, place, and time.  Psychiatric: Mood, memory, affect and judgment normal.   Patient reports morning stiffness for 30  minutes.   Patient denies nocturnal pain.  Difficulty dressing/grooming: Denies Difficulty climbing stairs: Reports Difficulty getting out of chair: Denies Difficulty using hands for taps, buttons, cutlery, and/or writing: Denies  Assessment and Plan: Dermatomyositis ([place]) - Positive biopsy May 2016, Ro 52 positive, elevated CK and aldolase, Gottron's papules, muscle weakness, fatigue, positive Ro, positive anti-chromatin: She has not had any recent signs or symptoms of a flare.  She has no muscle weakness or muscle tenderness.  She has not had any recent rashes.  She is clinically doing well on Imuran 100 mg po daily and prednisone 1 mg every other day.  She has been taking prednisone 1 mg every other day for the past 3 months.  We discussed discontinuing Prednisone at this time. Her sed rate and CK were WNL on 01/20/19.   We will recheck CK in 3 months. She  was advised to notify us if she develops signs or symptoms of a flare.  She will follow up in 3 months.  Sjogren's syndrome with keratoconjunctivitis sicca ([place]) - She has been having worsening mouth dryness.  She has been seeing her dentist on a regular basis.  She has tried several products without much relief.  She  continues to have eye dryness, and she uses eye drops daily.   High risk medication use -Current regimen includes Imuran 100 mg daily and prednisone 1 mg daily.  CBC and CMP were stable on 01/20/19.  She will be due to update CBC and CMP in August and every 3 months.    Primary osteoarthritis of both hands: She has right hand pain and stiffness. She has no joint swelling.    Primary osteoarthritis of both feet: She has no feet pain or joint swelling.   Spondylosis of lumbar region without myelopathy or radiculopathy: She is not experiencing lower back pain.  Other fatigue: She has been experiencing increased fatigue, which she attributes to depression.  Her husband passed away on Nov 21, 2018.  She has been more sedentary recently.  We discussed the importance of regular exercise and good sleep hygiene.   History of hypothyroidism: TSH was WNL on 01/13/18  Follow Up Instructions: She will follow up in 3 months.  She is due to update lab work in August and every 3 months.    I discussed the assessment and treatment plan with the patient. The patient was provided an opportunity to ask questions and all were answered. The patient agreed with the plan and demonstrated an understanding of the instructions.   The patient was advised to call back or seek an in-person evaluation if the symptoms worsen or if the condition fails to improve as anticipated.  I provided 25 minutes of non-face-to-face time during this encounter.  Bo Merino, MD   Scribed by-  Ofilia Neas, PA-C

## 2019-01-17 NOTE — Telephone Encounter (Signed)
Attempted to contact the patient and left message on machine to advise patient to call the office.  °

## 2019-01-21 LAB — COMPLETE METABOLIC PANEL WITH GFR
AG Ratio: 1.5 (calc) (ref 1.0–2.5)
ALT: 14 U/L (ref 6–29)
AST: 23 U/L (ref 10–35)
Albumin: 4 g/dL (ref 3.6–5.1)
Alkaline phosphatase (APISO): 82 U/L (ref 37–153)
BUN: 8 mg/dL (ref 7–25)
CO2: 28 mmol/L (ref 20–32)
Calcium: 9.3 mg/dL (ref 8.6–10.4)
Chloride: 104 mmol/L (ref 98–110)
Creat: 0.68 mg/dL (ref 0.50–0.99)
GFR, Est African American: 109 mL/min/{1.73_m2} (ref >=60)
GFR, Est Non African American: 94 mL/min/{1.73_m2} (ref >=60)
Globulin: 2.6 g/dL (calc) (ref 1.9–3.7)
Glucose, Bld: 100 mg/dL — ABNORMAL HIGH (ref 65–99)
Potassium: 3.7 mmol/L (ref 3.5–5.3)
Sodium: 140 mmol/L (ref 135–146)
Total Bilirubin: 0.5 mg/dL (ref 0.2–1.2)
Total Protein: 6.6 g/dL (ref 6.1–8.1)

## 2019-01-21 LAB — URINALYSIS, ROUTINE W REFLEX MICROSCOPIC
Bilirubin Urine: NEGATIVE
Glucose, UA: NEGATIVE
Hgb urine dipstick: NEGATIVE
Ketones, ur: NEGATIVE
Leukocytes,Ua: NEGATIVE
Nitrite: NEGATIVE
Protein, ur: NEGATIVE
Specific Gravity, Urine: 1.012 (ref 1.001–1.03)
pH: 6 (ref 5.0–8.0)

## 2019-01-21 LAB — CBC WITH DIFFERENTIAL/PLATELET
Absolute Monocytes: 455 {cells}/uL (ref 200–950)
Basophils Absolute: 41 {cells}/uL (ref 0–200)
Basophils Relative: 0.9 %
Eosinophils Absolute: 198 {cells}/uL (ref 15–500)
Eosinophils Relative: 4.3 %
HCT: 39.6 % (ref 35.0–45.0)
Hemoglobin: 13.5 g/dL (ref 11.7–15.5)
Lymphs Abs: 754 {cells}/uL — ABNORMAL LOW (ref 850–3900)
MCH: 29.2 pg (ref 27.0–33.0)
MCHC: 34.1 g/dL (ref 32.0–36.0)
MCV: 85.5 fL (ref 80.0–100.0)
MPV: 9.9 fL (ref 7.5–12.5)
Monocytes Relative: 9.9 %
Neutro Abs: 3151 {cells}/uL (ref 1500–7800)
Neutrophils Relative %: 68.5 %
Platelets: 248 Thousand/uL (ref 140–400)
RBC: 4.63 Million/uL (ref 3.80–5.10)
RDW: 12.6 % (ref 11.0–15.0)
Total Lymphocyte: 16.4 %
WBC: 4.6 Thousand/uL (ref 3.8–10.8)

## 2019-01-21 LAB — CK: Total CK: 50 U/L (ref 29–143)

## 2019-01-21 LAB — SEDIMENTATION RATE: Sed Rate: 9 mm/h (ref 0–30)

## 2019-01-24 ENCOUNTER — Encounter: Payer: Self-pay | Admitting: Rheumatology

## 2019-01-24 ENCOUNTER — Telehealth (INDEPENDENT_AMBULATORY_CARE_PROVIDER_SITE_OTHER): Payer: Medicare HMO | Admitting: Rheumatology

## 2019-01-24 ENCOUNTER — Other Ambulatory Visit: Payer: Self-pay

## 2019-01-24 DIAGNOSIS — M19071 Primary osteoarthritis, right ankle and foot: Secondary | ICD-10-CM

## 2019-01-24 DIAGNOSIS — E559 Vitamin D deficiency, unspecified: Secondary | ICD-10-CM

## 2019-01-24 DIAGNOSIS — Z79899 Other long term (current) drug therapy: Secondary | ICD-10-CM

## 2019-01-24 DIAGNOSIS — C4491 Basal cell carcinoma of skin, unspecified: Secondary | ICD-10-CM

## 2019-01-24 DIAGNOSIS — M3390 Dermatopolymyositis, unspecified, organ involvement unspecified: Secondary | ICD-10-CM

## 2019-01-24 DIAGNOSIS — Z8639 Personal history of other endocrine, nutritional and metabolic disease: Secondary | ICD-10-CM

## 2019-01-24 DIAGNOSIS — M19042 Primary osteoarthritis, left hand: Secondary | ICD-10-CM

## 2019-01-24 DIAGNOSIS — Z87898 Personal history of other specified conditions: Secondary | ICD-10-CM

## 2019-01-24 DIAGNOSIS — M3501 Sicca syndrome with keratoconjunctivitis: Secondary | ICD-10-CM

## 2019-01-24 DIAGNOSIS — Z8659 Personal history of other mental and behavioral disorders: Secondary | ICD-10-CM

## 2019-01-24 DIAGNOSIS — M19072 Primary osteoarthritis, left ankle and foot: Secondary | ICD-10-CM

## 2019-01-24 DIAGNOSIS — M19041 Primary osteoarthritis, right hand: Secondary | ICD-10-CM

## 2019-01-24 DIAGNOSIS — M47816 Spondylosis without myelopathy or radiculopathy, lumbar region: Secondary | ICD-10-CM

## 2019-01-24 DIAGNOSIS — R5383 Other fatigue: Secondary | ICD-10-CM

## 2019-04-17 ENCOUNTER — Other Ambulatory Visit: Payer: Self-pay | Admitting: Rheumatology

## 2019-04-17 DIAGNOSIS — M3390 Dermatopolymyositis, unspecified, organ involvement unspecified: Secondary | ICD-10-CM

## 2019-04-17 NOTE — Telephone Encounter (Signed)
Last visit: 01/24/19 Next Visit: 05/02/19 Labs: 01/20/19 CBC and CMP are stable

## 2019-04-18 NOTE — Progress Notes (Signed)
Office Visit Note  Patient: Carmen Woods             Date of Birth: 1956-02-13           MRN: [id number]             PCP: Carmen Chroman, MD Referring: Carmen Chroman, MD Visit Date: 05/02/2019 Occupation: @GUAROCC @  Subjective:  Pain in both hands   History of Present Illness: Carmen Woods is a 63 y.o. female with history of dermatomyositis, Sjogren's, and osteoarthritis. She is taking Imuran 100 mg po daily.  She discontinued Prednisone 1 mg daily in May 2020.  She has not had any signs or symptoms of a dermatomyositis flare.  She denies any muscle aches or muscle weakness at this time.  She denies any difficulty getting up from a chair or raising her arms above her head.  She denies any recent rashes.  She reports she is been having increased pain in both hands versus intermittent joint swelling since discontinuing prednisone in May.  She denies any overuse activities.  She has been wearing arthritis gloves occasionally and been using CBD oil topically for pain relief.  She has occasional pain in bilateral knee joints and has difficulty with steps.  She denies any joint swelling.  She has occasional left hip pain but denies any discomfort at this time.  She denies any lower back pain.  She denies any pain in her feet and has been wearing supportive shoes recently.   Activities of Daily Living:  Patient reports morning stiffness for 1 hour.   Patient Reports nocturnal pain.  Difficulty dressing/grooming: Denies Difficulty climbing stairs: Reports Difficulty getting out of chair: Reports Difficulty using hands for taps, buttons, cutlery, and/or writing: Reports  Review of Systems  Constitutional: Positive for fatigue.  HENT: Positive for mouth dryness. Negative for mouth sores and nose dryness.   Eyes: Positive for dryness. Negative for pain, itching and visual disturbance.  Respiratory: Negative for cough, hemoptysis, shortness of breath, wheezing and difficulty  breathing.   Cardiovascular: Negative for chest pain, palpitations, hypertension, irregular heartbeat and swelling in legs/feet.  Gastrointestinal: Positive for constipation and diarrhea. Negative for blood in stool.  Endocrine: Negative for increased urination.  Genitourinary: Negative for painful urination.  Musculoskeletal: Positive for arthralgias, joint pain, joint swelling and morning stiffness. Negative for myalgias, muscle weakness, muscle tenderness and myalgias.  Skin: Negative for color change, pallor, rash, hair loss, nodules/bumps, redness, skin tightness, ulcers and sensitivity to sunlight.  Allergic/Immunologic: Negative for susceptible to infections.  Neurological: Negative for dizziness, numbness, headaches, memory loss and weakness.  Hematological: Negative for bruising/bleeding tendency and swollen glands.  Psychiatric/Behavioral: Positive for depressed mood. Negative for confusion and sleep disturbance. The patient is nervous/anxious.     PMFS History:  Patient Active Problem List   Diagnosis Date Noted   Basal cell carcinoma (BCC) 08/25/2017   Primary osteoarthritis of both hands 03/18/2017   Primary osteoarthritis of both feet 03/18/2017   High risk medication use 11/10/2016   Galtron's Papules 11/10/2016   Other fatigue 11/10/2016   Spondylosis of lumbar region without myelopathy or radiculopathy 11/10/2016   History of anxiety 11/10/2016   Vitamin D deficiency 11/10/2016   History of vertigo 11/10/2016   Sjogren's syndrome with keratoconjunctivitis sicca ([place]) 11/10/2016   History of hypothyroidism 11/10/2016   Dermatomyositis ([place]) 01/07/2015   Mitral regurgitation 01/19/2013   Precordial pain 01/19/2013    Past Medical History:  Diagnosis Date   DDD (  degenerative disc disease), lumbar    Dermatomyositis ([place]) 12/2014   Difficulty swallowing    Dry eyes    Dry mouth    Hypothyroidism    Mass of left thigh 12/2014   Rash    hands,  left thigh    Family History  Problem Relation Age of Onset   Heart Problems Mother        Pacermaker   Breast cancer Mother    Diabetes Father    Osteoarthritis Sister    Kidney disease Sister 42   Rheum arthritis Sister    Thyroid disease Sister    Past Surgical History:  Procedure Laterality Date   [place] Bilateral 11/2014   MUSCLE BIOPSY Left 01/07/2015   Procedure: LEFT THIGH MUSCLE BIOPSY;  Surgeon: Fanny Skates, MD;  Location: [facility];  Service: General;  Laterality: Left;   skin cancer removal Right 2020   hand   skin cancer removal   07/2017   Social History   Social History Narrative   Not on file   Immunization History  Administered Date(s) Administered   Influenza,inj,quad, With Preservative 07/12/2017     Objective: Vital Signs: BP 127/73 (BP Location: Left Arm, Patient Position: Sitting, Cuff Size: Normal)    Pulse 66    Resp 14    Ht 5\' 8"  (1.727 m)    Wt 179 lb 6.4 oz (81.4 kg)    BMI 27.28 kg/m    Physical Exam Vitals signs and nursing note reviewed.  Constitutional:      Appearance: She is well-developed.  HENT:     Head: Normocephalic and atraumatic.  Eyes:     Conjunctiva/sclera: Conjunctivae normal.  Neck:     Musculoskeletal: Normal range of motion.  Cardiovascular:     Rate and Rhythm: Normal rate and regular rhythm.     Heart sounds: Normal heart sounds.  Pulmonary:     Effort: Pulmonary effort is normal.     Breath sounds: Normal breath sounds.  Abdominal:     General: Bowel sounds are normal.     Palpations: Abdomen is soft.  Lymphadenopathy:     Cervical: No cervical adenopathy.  Skin:    General: Skin is warm and dry.     Capillary Refill: Capillary refill takes less than 2 seconds.  Neurological:     Mental Status: She is alert and oriented to person, place, and time.  Psychiatric:        Behavior: Behavior normal.      Musculoskeletal Exam: C-spine,  thoracic spine, lumbar spine good range of motion.  No midline spinal tenderness.  No SI joint tenderness.  Shoulder joints, elbow joints, wrist joints, MCPs and PIPs, DIPs good range of motion no synovitis.  She has PIP and DIP synovial thickening consistent with osteoarthritis of bilateral hands.  She has tenderness of the right third PIP joint.  She has incomplete fist formation bilaterally.  Hip joints, knee joints, ankle joints, MTPs, PIPs and DIPs good range of motion no synovitis.  No warmth or effusion of bilateral knee joints.  She has bilateral knee crepitus.  No tenderness or swelling of ankle joints.  No tenderness over trochanter bursa bilaterally.  She has full strength of upper and lower extremities.  CDAI Exam: CDAI Score: -- Patient Global: --; Provider Global: -- Swollen: --; Tender: -- Joint Exam   No joint exam has been documented for this visit   There is currently no information  documented on the homunculus. Go to the Rheumatology activity and complete the homunculus joint exam.  Investigation: No additional findings.  Imaging: No results found.  Recent Labs: Lab Results  Component Value Date   WBC 4.6 01/20/2019   HGB 13.5 01/20/2019   PLT 248 01/20/2019   NA 140 01/20/2019   K 3.7 01/20/2019   CL 104 01/20/2019   CO2 28 01/20/2019   GLUCOSE 100 (H) 01/20/2019   BUN 8 01/20/2019   CREATININE 0.68 01/20/2019   BILITOT 0.5 01/20/2019   ALKPHOS 99 03/22/2017   AST 23 01/20/2019   ALT 14 01/20/2019   PROT 6.6 01/20/2019   ALBUMIN 4.3 03/22/2017   CALCIUM 9.3 01/20/2019   GFRAA 109 01/20/2019    Speciality Comments: No specialty comments available.  Procedures:  No procedures performed Allergies: Codeine and Hydrocodone   Assessment / Plan:     Visit Diagnoses: Dermatomyositis ([place]) - Positive biopsy May 2016, Ro 52 positive, elevated CK and aldolase, Gottron's papules, muscle weakness, fatigue, positive Ro, positive anti-chromatin: She has not had  any signs or symptoms of a dermatomyositis flare recently.  She has no muscle tenderness or muscle weakness at this time.  She has no difficulty getting up from a seated position or raising her arms above her head.  She has full strength of upper and lower extremities on exam today.  She has not had any recent rashes.  She is clinically doing well on Imuran 100 mg by mouth daily.  She discontinued prednisone 1 mg by mouth daily in May 2020 due to clinically doing well.  She has not noticed any increased muscle weakness or muscle tenderness since discontinuing prednisone.  She will continue taking Imuran as prescribed.  Sed rate was 9 and CK was 50 on 01/20/2019.  We will check a CK level today.  She will follow-up in the office in 5 months.  Sjogren's syndrome with keratoconjunctivitis sicca ([place]) - She continues to have chronic sicca symptoms.  She uses Restasis eyedrops as needed for symptomatic relief.  High risk medication use - Imuran 100 mg by mouth daily.  CBC and CMP were drawn on 01/20/2019.  We will update lab work today.  She will return for lab work in November and every 3 months to monitor for drug toxicity.  Primary osteoarthritis of both hands -She has PIP and DIP synovial thickening consistent with osteoarthritis of both hands.  She has been experiencing increased pain and stiffness in bilateral hands since discontinuing prednisone in May 2020.  She has incomplete fist formation bilaterally.  She has tenderness of the right third PIP joint.  She has no tenderness or synovitis of the MCP joints.  She had x-rays of both hands on 11/19/2016 that revealed minimal PIP narrowing but no erosive changes or MCP joint narrowing.  She has tried wearing arthritis gloves and using CBD oil topically as needed for pain relief.  We discussed the importance of joint protection and muscle strengthening.  She was given a handout of hand exercises to perform.  We also discussed the use of Voltaren gel which she can  apply topically as needed for pain relief.  Primary osteoarthritis of both feet - She has no feet pain or joint swelling at this time.  No tenderness noted on exam. She wears proper fitting shoes.   Spondylosis of lumbar region without myelopathy or radiculopathy - She  Other medical conditions are listed as follows:   Basal cell carcinoma (BCC), unspecified site - She  is followed closely by dermatology.   Other fatigue - She has chronic fatigue   History of hypothyroidism   History of anxiety   History of vertigo  Vitamin D deficiency: She takes a vitamin D supplement.   Orders: Orders Placed This Encounter  Procedures   CBC with Differential/Platelet   COMPLETE METABOLIC PANEL WITH GFR   CK   No orders of the defined types were placed in this encounter.    Follow-Up Instructions: Return in about 5 months (around 10/02/2019) for Dermatomyositis , Sjogren's syndrome, Osteoarthritis.   Ofilia Neas, PA-C  Note - This record has been created using Dragon software.  Chart creation errors have been sought, but may not always  have been located. Such creation errors do not reflect on  the standard of medical care.

## 2019-05-02 ENCOUNTER — Encounter: Payer: Self-pay | Admitting: Physician Assistant

## 2019-05-02 ENCOUNTER — Other Ambulatory Visit: Payer: Self-pay

## 2019-05-02 ENCOUNTER — Ambulatory Visit (INDEPENDENT_AMBULATORY_CARE_PROVIDER_SITE_OTHER): Payer: Medicare HMO | Admitting: Physician Assistant

## 2019-05-02 VITALS — BP 127/73 | HR 66 | Resp 14 | Ht 68.0 in | Wt 179.4 lb

## 2019-05-02 DIAGNOSIS — M3390 Dermatopolymyositis, unspecified, organ involvement unspecified: Secondary | ICD-10-CM | POA: Diagnosis not present

## 2019-05-02 DIAGNOSIS — M19071 Primary osteoarthritis, right ankle and foot: Secondary | ICD-10-CM

## 2019-05-02 DIAGNOSIS — R5383 Other fatigue: Secondary | ICD-10-CM

## 2019-05-02 DIAGNOSIS — M3501 Sicca syndrome with keratoconjunctivitis: Secondary | ICD-10-CM

## 2019-05-02 DIAGNOSIS — M19041 Primary osteoarthritis, right hand: Secondary | ICD-10-CM

## 2019-05-02 DIAGNOSIS — M19042 Primary osteoarthritis, left hand: Secondary | ICD-10-CM

## 2019-05-02 DIAGNOSIS — Z87898 Personal history of other specified conditions: Secondary | ICD-10-CM

## 2019-05-02 DIAGNOSIS — Z8639 Personal history of other endocrine, nutritional and metabolic disease: Secondary | ICD-10-CM

## 2019-05-02 DIAGNOSIS — M19072 Primary osteoarthritis, left ankle and foot: Secondary | ICD-10-CM

## 2019-05-02 DIAGNOSIS — E559 Vitamin D deficiency, unspecified: Secondary | ICD-10-CM

## 2019-05-02 DIAGNOSIS — Z8659 Personal history of other mental and behavioral disorders: Secondary | ICD-10-CM

## 2019-05-02 DIAGNOSIS — Z79899 Other long term (current) drug therapy: Secondary | ICD-10-CM | POA: Diagnosis not present

## 2019-05-02 DIAGNOSIS — M3313 Other dermatomyositis without myopathy: Secondary | ICD-10-CM

## 2019-05-02 DIAGNOSIS — C4491 Basal cell carcinoma of skin, unspecified: Secondary | ICD-10-CM

## 2019-05-02 DIAGNOSIS — M47816 Spondylosis without myelopathy or radiculopathy, lumbar region: Secondary | ICD-10-CM

## 2019-05-02 NOTE — Patient Instructions (Signed)
Hand Exercises °Hand exercises can be helpful for almost anyone. These exercises can strengthen the hands, improve flexibility and movement, and increase blood flow to the hands. These results can make work and daily tasks easier. Hand exercises can be especially helpful for people who have joint pain from arthritis or have nerve damage from overuse (carpal tunnel syndrome). These exercises can also help people who have injured a hand. °Exercises °Most of these hand exercises are gentle stretching and motion exercises. It is usually safe to do them often throughout the day. Warming up your hands before exercise may help to reduce stiffness. You can do this with gentle massage or by placing your hands in warm water for 10-15 minutes. °It is normal to feel some stretching, pulling, tightness, or mild discomfort as you begin new exercises. This will gradually improve. Stop an exercise right away if you feel sudden, severe pain or your pain gets worse. Ask your health care provider which exercises are best for you. °Knuckle bend or "claw" fist °1. Stand or sit with your arm, hand, and all five fingers pointed straight up. Make sure to keep your wrist straight during the exercise. °2. Gently bend your fingers down toward your palm until the tips of your fingers are touching the top of your palm. Keep your big knuckle straight and just bend the small knuckles in your fingers. °3. Hold this position for __________ seconds. °4. Straighten (extend) your fingers back to the starting position. °Repeat this exercise 5-10 times with each hand. °Full finger fist °1. Stand or sit with your arm, hand, and all five fingers pointed straight up. Make sure to keep your wrist straight during the exercise. °2. Gently bend your fingers into your palm until the tips of your fingers are touching the middle of your palm. °3. Hold this position for __________ seconds. °4. Extend your fingers back to the starting position, stretching every  joint fully. °Repeat this exercise 5-10 times with each hand. °Straight fist °1. Stand or sit with your arm, hand, and all five fingers pointed straight up. Make sure to keep your wrist straight during the exercise. °2. Gently bend your fingers at the big knuckle, where your fingers meet your hand, and the middle knuckle. Keep the knuckle at the tips of your fingers straight and try to touch the bottom of your palm. °3. Hold this position for __________ seconds. °4. Extend your fingers back to the starting position, stretching every joint fully. °Repeat this exercise 5-10 times with each hand. °Tabletop °1. Stand or sit with your arm, hand, and all five fingers pointed straight up. Make sure to keep your wrist straight during the exercise. °2. Gently bend your fingers at the big knuckle, where your fingers meet your hand, as far down as you can while keeping the small knuckles in your fingers straight. Think of forming a tabletop with your fingers. °3. Hold this position for __________ seconds. °4. Extend your fingers back to the starting position, stretching every joint fully. °Repeat this exercise 5-10 times with each hand. °Finger spread °1. Place your hand flat on a table with your palm facing down. Make sure your wrist stays straight as you do this exercise. °2. Spread your fingers and thumb apart from each other as far as you can until you feel a gentle stretch. Hold this position for __________ seconds. °3. Bring your fingers and thumb tight together again. Hold this position for __________ seconds. °Repeat this exercise 5-10 times with each hand. °  Making circles °1. Stand or sit with your arm, hand, and all five fingers pointed straight up. Make sure to keep your wrist straight during the exercise. °2. Make a circle by touching the tip of your thumb to the tip of your index finger. °3. Hold for __________ seconds. Then open your hand wide. °4. Repeat this motion with your thumb and each finger on your  hand. °Repeat this exercise 5-10 times with each hand. °Thumb motion °1. Sit with your forearm resting on a table and your wrist straight. Your thumb should be facing up toward the ceiling. Keep your fingers relaxed as you move your thumb. °2. Lift your thumb up as high as you can toward the ceiling. Hold for __________ seconds. °3. Bend your thumb across your palm as far as you can, reaching the tip of your thumb for the small finger (pinkie) side of your palm. Hold for __________ seconds. °Repeat this exercise 5-10 times with each hand. °Grip strengthening ° °1. Hold a stress ball or other soft ball in the middle of your hand. °2. Slowly increase the pressure, squeezing the ball as much as you can without causing pain. Think of bringing the tips of your fingers into the middle of your palm. All of your finger joints should bend when doing this exercise. °3. Hold your squeeze for __________ seconds, then relax. °Repeat this exercise 5-10 times with each hand. °Contact a health care provider if: °· Your hand pain or discomfort gets much worse when you do an exercise. °· Your hand pain or discomfort does not improve within 2 hours after you exercise. °If you have any of these problems, stop doing these exercises right away. Do not do them again unless your health care provider says that you can. °Get help right away if: °· You develop sudden, severe hand pain or swelling. If this happens, stop doing these exercises right away. Do not do them again unless your health care provider says that you can. °This information is not intended to replace advice given to you by your health care provider. Make sure you discuss any questions you have with your health care provider. °Document Released: 08/19/2015 Document Revised: 12/29/2018 Document Reviewed: 09/08/2018 °Elsevier Patient Education © 2020 [employer] ° °

## 2019-05-03 LAB — COMPLETE METABOLIC PANEL WITH GFR
AG Ratio: 2 (calc) (ref 1.0–2.5)
ALT: 15 U/L (ref 6–29)
AST: 23 U/L (ref 10–35)
Albumin: 4.5 g/dL (ref 3.6–5.1)
Alkaline phosphatase (APISO): 88 U/L (ref 37–153)
BUN: 10 mg/dL (ref 7–25)
CO2: 30 mmol/L (ref 20–32)
Calcium: 10.2 mg/dL (ref 8.6–10.4)
Chloride: 104 mmol/L (ref 98–110)
Creat: 0.71 mg/dL (ref 0.50–0.99)
GFR, Est African American: 106 mL/min/{1.73_m2} (ref >=60)
GFR, Est Non African American: 91 mL/min/{1.73_m2} (ref >=60)
Globulin: 2.3 g/dL (calc) (ref 1.9–3.7)
Glucose, Bld: 112 mg/dL — ABNORMAL HIGH (ref 65–99)
Potassium: 4.3 mmol/L (ref 3.5–5.3)
Sodium: 140 mmol/L (ref 135–146)
Total Bilirubin: 0.8 mg/dL (ref 0.2–1.2)
Total Protein: 6.8 g/dL (ref 6.1–8.1)

## 2019-05-03 LAB — CBC WITH DIFFERENTIAL/PLATELET
Absolute Monocytes: 502 cells/uL (ref 200–950)
Basophils Absolute: 38 cells/uL (ref 0–200)
Basophils Relative: 0.7 %
Eosinophils Absolute: 243 cells/uL (ref 15–500)
Eosinophils Relative: 4.5 %
HCT: 39.6 % (ref 35.0–45.0)
Hemoglobin: 13.3 g/dL (ref 11.7–15.5)
Lymphs Abs: 788 cells/uL — ABNORMAL LOW (ref 850–3900)
MCH: 29.5 pg (ref 27.0–33.0)
MCHC: 33.6 g/dL (ref 32.0–36.0)
MCV: 87.8 fL (ref 80.0–100.0)
MPV: 9.8 fL (ref 7.5–12.5)
Monocytes Relative: 9.3 %
Neutro Abs: 3829 cells/uL (ref 1500–7800)
Neutrophils Relative %: 70.9 %
Platelets: 251 10*3/uL (ref 140–400)
RBC: 4.51 10*6/uL (ref 3.80–5.10)
RDW: 12.9 % (ref 11.0–15.0)
Total Lymphocyte: 14.6 %
WBC: 5.4 10*3/uL (ref 3.8–10.8)

## 2019-05-03 LAB — CK: Total CK: 90 U/L (ref 29–143)

## 2019-05-03 NOTE — Progress Notes (Signed)
CBC WNL.  Glucose is 112.  Rest of CMP WNL.  CK WNL.

## 2019-07-24 ENCOUNTER — Other Ambulatory Visit: Payer: Self-pay

## 2019-07-24 ENCOUNTER — Other Ambulatory Visit: Payer: Self-pay | Admitting: Rheumatology

## 2019-07-24 DIAGNOSIS — M3390 Dermatopolymyositis, unspecified, organ involvement unspecified: Secondary | ICD-10-CM

## 2019-07-24 DIAGNOSIS — Z79899 Other long term (current) drug therapy: Secondary | ICD-10-CM

## 2019-07-24 NOTE — Telephone Encounter (Signed)
Last Visit: 05/02/19 Next Visit: 10/03/19 Labs: 05/02/19 CBC WNL. Glucose is 112. Rest of CMP WNL. CK WNL.   Patient advised she is due to update to labs this month.   Okay to refill per Dr. Estanislado Pandy

## 2019-07-25 LAB — COMPLETE METABOLIC PANEL WITH GFR
AG Ratio: 1.7 (calc) (ref 1.0–2.5)
ALT: 9 U/L (ref 6–29)
AST: 21 U/L (ref 10–35)
Albumin: 4.3 g/dL (ref 3.6–5.1)
Alkaline phosphatase (APISO): 76 U/L (ref 37–153)
BUN/Creatinine Ratio: 9 (calc) (ref 6–22)
BUN: 6 mg/dL — ABNORMAL LOW (ref 7–25)
CO2: 27 mmol/L (ref 20–32)
Calcium: 9.7 mg/dL (ref 8.6–10.4)
Chloride: 106 mmol/L (ref 98–110)
Creat: 0.68 mg/dL (ref 0.50–0.99)
GFR, Est African American: 109 mL/min/{1.73_m2} (ref >=60)
GFR, Est Non African American: 94 mL/min/{1.73_m2} (ref >=60)
Globulin: 2.5 g/dL (calc) (ref 1.9–3.7)
Glucose, Bld: 127 mg/dL — ABNORMAL HIGH (ref 65–99)
Potassium: 3.6 mmol/L (ref 3.5–5.3)
Sodium: 141 mmol/L (ref 135–146)
Total Bilirubin: 0.7 mg/dL (ref 0.2–1.2)
Total Protein: 6.8 g/dL (ref 6.1–8.1)

## 2019-07-25 LAB — CBC WITH DIFFERENTIAL/PLATELET
Absolute Monocytes: 391 cells/uL (ref 200–950)
Basophils Absolute: 18 cells/uL (ref 0–200)
Basophils Relative: 0.4 %
Eosinophils Absolute: 170 cells/uL (ref 15–500)
Eosinophils Relative: 3.7 %
HCT: 35.5 % (ref 35.0–45.0)
Hemoglobin: 12.3 g/dL (ref 11.7–15.5)
Lymphs Abs: 943 cells/uL (ref 850–3900)
MCH: 29.9 pg (ref 27.0–33.0)
MCHC: 34.6 g/dL (ref 32.0–36.0)
MCV: 86.2 fL (ref 80.0–100.0)
MPV: 10.5 fL (ref 7.5–12.5)
Monocytes Relative: 8.5 %
Neutro Abs: 3077 cells/uL (ref 1500–7800)
Neutrophils Relative %: 66.9 %
Platelets: 239 10*3/uL (ref 140–400)
RBC: 4.12 10*6/uL (ref 3.80–5.10)
RDW: 12.3 % (ref 11.0–15.0)
Total Lymphocyte: 20.5 %
WBC: 4.6 10*3/uL (ref 3.8–10.8)

## 2019-07-25 LAB — CK: Total CK: 114 U/L (ref 29–143)

## 2019-07-25 NOTE — Progress Notes (Signed)
Labs are stable.

## 2019-09-20 ENCOUNTER — Telehealth: Payer: Self-pay | Admitting: Rheumatology

## 2019-09-20 NOTE — Telephone Encounter (Signed)
Patient called stating her PCP told her to contact Dr. Estanislado Pandy regarding COVID vaccination and if she would recommend it.

## 2019-09-20 NOTE — Telephone Encounter (Signed)
Advised patient, Per Dr. Estanislado Pandy, she recommends the COVID vaccine and patient is okay to receive it due to the vaccine not being live. Patient verbalized understanding.

## 2019-09-25 ENCOUNTER — Telehealth: Payer: Self-pay | Admitting: Rheumatology

## 2019-09-25 NOTE — Telephone Encounter (Signed)
Patient requesting a call back to discuss getting permanent eyebrows. (tatoo) Patient has half of an eyebrow on each side, and wants to permanently correct this. Patient wants to make sure this is okay with her diagnosis. Please call to advise.

## 2019-09-25 NOTE — Telephone Encounter (Signed)
Patient has Dermatomyositis and sjogren's. patient is on imuran.   Okay for patient to get eyebrow tattoos?

## 2019-09-25 NOTE — Telephone Encounter (Signed)
Advised patient There is increased risk of infection.  Patient has to be careful with the tattoos. Patient verbalized understanding.

## 2019-09-25 NOTE — Telephone Encounter (Signed)
There is increased risk of infection.  Patient has to be careful with the tattoos.

## 2019-09-27 ENCOUNTER — Telehealth: Payer: Self-pay | Admitting: Rheumatology

## 2019-09-27 NOTE — Telephone Encounter (Signed)
Patient left a voicemail stating she needs a "written permission slip from Dr. Estanislado Pandy so she can have her eyebrows tattooed."  Patient requested a return call.

## 2019-09-27 NOTE — Telephone Encounter (Signed)
We do not give clearance for the tattoos.

## 2019-09-27 NOTE — Telephone Encounter (Signed)
Patient was advised on 09/25/2019 "There is increased risk of infection.  Patient has to be careful with the tattoos." in regards to the eyebrow tattoos.  Patient is now requesting a note for clearance to get the eyebrow tattoos. Please advise.

## 2019-09-27 NOTE — Telephone Encounter (Signed)
Advised patient We do not give clearance for the tattoos. Patient verbalized understanding.

## 2019-10-03 ENCOUNTER — Ambulatory Visit: Payer: Medicare HMO | Admitting: Physician Assistant

## 2019-10-05 ENCOUNTER — Other Ambulatory Visit: Payer: Self-pay | Admitting: *Deleted

## 2019-10-05 DIAGNOSIS — M3390 Dermatopolymyositis, unspecified, organ involvement unspecified: Secondary | ICD-10-CM

## 2019-10-05 MED ORDER — AZATHIOPRINE 50 MG PO TABS: 100.0000 mg | ORAL_TABLET | Freq: Every day | ORAL | 0 refills | Status: DC

## 2019-10-05 NOTE — Telephone Encounter (Signed)
Last Visit: 05/02/19 Next Visit: 11/21/19 Labs: 07/24/19 Stable  Okay to refill per Dr. Estanislado Pandy

## 2019-11-04 ENCOUNTER — Other Ambulatory Visit: Payer: Self-pay | Admitting: Rheumatology

## 2019-11-04 DIAGNOSIS — M3390 Dermatopolymyositis, unspecified, organ involvement unspecified: Secondary | ICD-10-CM

## 2019-11-07 ENCOUNTER — Other Ambulatory Visit: Payer: Self-pay | Admitting: Rheumatology

## 2019-11-07 DIAGNOSIS — M3390 Dermatopolymyositis, unspecified, organ involvement unspecified: Secondary | ICD-10-CM

## 2019-11-14 ENCOUNTER — Telehealth: Payer: Self-pay | Admitting: Rheumatology

## 2019-11-14 NOTE — Telephone Encounter (Signed)
Patient called stating she received a call from [facility] that Dr. Estanislado Pandy declined to refill her prescription of Imuran.  Patient is requesting a return call.

## 2019-11-14 NOTE — Telephone Encounter (Signed)
Patient advised the prescription was declined because we sent in a refill on 10/05/19 for a 90 day supply.

## 2019-11-15 NOTE — Progress Notes (Signed)
Office Visit Note  Patient: Carmen Woods             Date of Birth: Feb 21, 1956           MRN: [id number]             PCP: Glenda Chroman, MD Referring: Glenda Chroman, MD Visit Date: 11/21/2019 Occupation: @GUAROCC @  Subjective:  Pain in both hands   History of Present Illness: Carmen Woods is a 64 y.o. female with history of dermatomyositis, Sjogren's syndrome, and osteoarthritis.  Patient is taking Imuran 100 mg by mouth daily.  She denies any signs or symptoms of a dermatomyositis flare.  She denies any increased muscle weakness or muscle tenderness at this time.  She has not had any recent rashes.  She states she is been experiencing increased pain and stiffness in both hands.  She is having difficulty making a complete fist.  She states her stiffness is very severe in the morning and she tries to perform hand exercises and occasionally wears arthritis gloves.  She is also used CBD oil topically but has not noticed any improvement.  She states she has noticed hammertoes in her feet starting to develop.  She denies any joint swelling.  She continues to have chronic sicca symptoms.  She uses their breath lesions and rinses which help for symptomatic relief.  She is no longer using Restasis due to not seeing Dr. Katy Fitch recently.    Activities of Daily Living:  Patient reports joint stiffness all day  Patient Reports nocturnal pain.  Difficulty dressing/grooming: Reports Difficulty climbing stairs: Reports Difficulty getting out of chair: Reports Difficulty using hands for taps, buttons, cutlery, and/or writing: Reports  Review of Systems  Constitutional: Positive for fatigue.  HENT: Positive for mouth dryness and nose dryness. Negative for mouth sores.   Eyes: Positive for dryness. Negative for pain and visual disturbance.  Respiratory: Negative for cough, hemoptysis, shortness of breath and difficulty breathing.   Cardiovascular: Negative for chest pain, palpitations,  hypertension, irregular heartbeat and swelling in legs/feet.  Gastrointestinal: Positive for constipation and diarrhea. Negative for blood in stool.  Endocrine: Negative for increased urination.  Genitourinary: Negative for difficulty urinating and painful urination.  Musculoskeletal: Positive for arthralgias, joint pain, joint swelling and morning stiffness. Negative for myalgias, muscle weakness, muscle tenderness and myalgias.  Skin: Negative for color change, pallor, rash, hair loss, nodules/bumps, skin tightness, ulcers and sensitivity to sunlight.  Allergic/Immunologic: Negative for susceptible to infections.  Neurological: Negative for dizziness, numbness, memory loss and weakness.  Hematological: Negative for bruising/bleeding tendency and swollen glands.  Psychiatric/Behavioral: Positive for sleep disturbance. Negative for depressed mood and confusion. The patient is not nervous/anxious.     PMFS History:  Patient Active Problem List   Diagnosis Date Noted  . Basal cell carcinoma (BCC) 08/25/2017  . Primary osteoarthritis of both hands 03/18/2017  . Primary osteoarthritis of both feet 03/18/2017  . High risk medication use 11/10/2016  . Galtron's Papules 11/10/2016  . Other fatigue 11/10/2016  . Spondylosis of lumbar region without myelopathy or radiculopathy 11/10/2016  . History of anxiety 11/10/2016  . Vitamin D deficiency 11/10/2016  . History of vertigo 11/10/2016  . Sjogren's syndrome with keratoconjunctivitis sicca ([place]) 11/10/2016  . History of hypothyroidism 11/10/2016  . Dermatomyositis ([place]) 01/07/2015  . Mitral regurgitation 01/19/2013  . Precordial pain 01/19/2013    Past Medical History:  Diagnosis Date  . DDD (degenerative disc disease), lumbar   . Dermatomyositis ([place]) 12/2014  .  Difficulty swallowing   . Dry eyes   . Dry mouth   . Hypothyroidism   . Mass of left thigh 12/2014  . Rash    hands, left thigh    Family History  Problem Relation Age of  Onset  . Heart Problems Mother        [place]  . Breast cancer Mother   . Diabetes Father   . Osteoarthritis Sister   . Kidney disease Sister 41  . Rheum arthritis Sister   . Thyroid disease Sister    Past Surgical History:  Procedure Laterality Date  . BACK SURGERY  1998  . BELPHAROPTOSIS REPAIR Bilateral 11/2014  . MUSCLE BIOPSY Left 01/07/2015   Procedure: LEFT THIGH MUSCLE BIOPSY;  Surgeon: Fanny Skates, MD;  Location: [facility];  Service: General;  Laterality: Left;  . skin cancer removal Right 2020   hand  . skin cancer removal   07/2017   Social History   Social History Narrative  . Not on file   Immunization History  Administered Date(s) Administered  . Influenza,inj,quad, With Preservative 07/12/2017     Objective: Vital Signs: BP 130/77 (BP Location: Left Arm, Patient Position: Sitting, Cuff Size: Normal)   Pulse 66   Resp 15   Ht 5\' 8"  (1.727 m)   Wt 180 lb (81.6 kg)   BMI 27.37 kg/m    Physical Exam Vitals and nursing note reviewed.  Constitutional:      Appearance: She is well-developed.  HENT:     Head: Normocephalic and atraumatic.  Eyes:     Conjunctiva/sclera: Conjunctivae normal.  Pulmonary:     Effort: Pulmonary effort is normal.  Abdominal:     General: Bowel sounds are normal.     Palpations: Abdomen is soft.  Musculoskeletal:     Cervical back: Normal range of motion.  Lymphadenopathy:     Cervical: No cervical adenopathy.  Skin:    General: Skin is warm and dry.     Capillary Refill: Capillary refill takes less than 2 seconds.  Neurological:     Mental Status: She is alert and oriented to person, place, and time.  Psychiatric:        Behavior: Behavior normal.      Musculoskeletal Exam: C-spine, thoracic spine, lumbar spine good range of motion.  No midline spinal tenderness.  No SI joint tenderness.  Shoulder joints good range of motion with no discomfort.  Elbow joints have good range of motion.  She has  tenderness over the lateral epicondyle of the left elbow.  Wrist joints have slightly limited flexion bilaterally.  She has severe PIP and DIP synovial thickening consistent with osteoarthritis of both hands.  She has tenderness of the right third PIP joint.  She has incomplete fist formation bilaterally.  Hip joints, knee joints, ankle joints, MTPs, PIPs and DIPs good range of motion with no synovitis.  No warmth or effusion of bilateral knee joints.  She has osteoarthritic changes in both feet.  CDAI Exam: CDAI Score: -- Patient Global: --; Provider Global: -- Swollen: --; Tender: -- Joint Exam 11/21/2019   No joint exam has been documented for this visit   There is currently no information documented on the homunculus. Go to the Rheumatology activity and complete the homunculus joint exam.  Investigation: No additional findings.  Imaging: XR Hand 2 View Left  Result Date: 11/21/2019 PIP and DIP narrowing was noted.  No MCP, intercarpal radiocarpal joint space narrowing was noted.  Some cystic  changes were noted in the carpal bones. Impression: These findings are consistent with osteoarthritis of the hand.  XR Hand 2 View Right  Result Date: 11/21/2019 PIP and DIP narrowing was noted.  No MCP, intercarpal radiocarpal joint space narrowing was noted. Impression: These findings are consistent with osteoarthritis of the hand.   Recent Labs: Lab Results  Component Value Date   WBC 4.6 07/24/2019   HGB 12.3 07/24/2019   PLT 239 07/24/2019   NA 141 07/24/2019   K 3.6 07/24/2019   CL 106 07/24/2019   CO2 27 07/24/2019   GLUCOSE 127 (H) 07/24/2019   BUN 6 (L) 07/24/2019   CREATININE 0.68 07/24/2019   BILITOT 0.7 07/24/2019   ALKPHOS 99 03/22/2017   AST 21 07/24/2019   ALT 9 07/24/2019   PROT 6.8 07/24/2019   ALBUMIN 4.3 03/22/2017   CALCIUM 9.7 07/24/2019   GFRAA 109 07/24/2019    Speciality Comments: No specialty comments available.  Procedures:  No procedures  performed Allergies: Codeine and Hydrocodone   Assessment / Plan:     Visit Diagnoses: Dermatomyositis ([place]) - Positive biopsy May 2016, Ro 52 positive, elevated CK and aldolase, Gottron's papules, muscle weakness, fatigue, positive Ro, positive anti-chromatin: She has not had any signs or symptoms of a dermatomyositis flare recently.  She has not had any increased muscle weakness or muscle tenderness.  She had no difficulty rising from a seated position or raising her arms above her head today.  She has full strength of upper and lower extremities.    She has not had any recurrence of Gottron's papules. She is clinically doing well on Imuran 100 mg by mouth daily.  She has not missed any doses of Imuran recently.  Her CK was 114 on 07/24/2019.  We will repeat CK today and every 3 months for monitoring purposes.  She was advised to notify us if she develops any new or worsening symptoms.  She will continue taking Imuran as prescribed.  She will follow-up in the office in 5 months.- Plan: CK  High risk medication use - Imuran 100 mg by mouth daily.  CBC and CMP were drawn on 07/24/2019.  She is due to update lab work today.  CBC and CMP will be repeated in June and every 3 months to monitor for drug toxicity.  Standing orders for CBC, CMP, and CK were placed today..- Plan: CBC with Differential/Platelet, COMPLETE METABOLIC PANEL WITH GFR  Sjogren's syndrome with keratoconjunctivitis sicca ([place]): She continues to have chronic sicca symptoms.  Her mouth dryness has been more severe recently.  She has been using a mouth rinse as well as lesions for symptomatic relief.  She follows closely with her dentist.  She continues to have eye dryness which has been tolerable using eyedrops.  She is no longer using Restasis.  We discussed the use of a humidifier during the winter months as well.  Primary osteoarthritis of both hands: She has PIP and DIP synovial thickening consistent with osteoarthritis of both hands.  No  synovitis was noted.  She has tenderness of the right third PIP joint.  Incomplete fist formation bilaterally.  X-rays of both hands were obtained on 11/19/2016 which revealed early osteoarthritis in both hands.  X-rays of both hands were updated today and were consistent with osteoarthritis of both hands. No erosive changes.   Pain in both hands - She presents today with increased pain in both hands.  She has severe PIP and DIP synovial thickening consistent with osteoarthritis  of both hands.  She has incomplete fist formation bilaterally.  She has tenderness of the right third PIP joint.  She has been experiencing intermittent joint swelling and increased morning stiffness in both hands.  We discussed the importance of joint protection and muscle strengthening.  A handout of hand exercises was provided to the patient.  X-rays of both hands were obtained today. X-rays were consistent with osteoarthritis of both hands.  Plan: XR Hand 2 View Left, XR Hand 2 View Right  Lateral epicondylitis, left elbow: She has tenderness over the lateral epicondyle of the left elbow.  We discussed the use of topical agents as well as using a brace.  She was given a handout of rehab exercises to perform.  Primary osteoarthritis of both feet: She has PIP and DIP synovial thickening consistent with osteoarthritis of both feet.  She started to develop hammertoes.  We discussed the use of metatarsal pads and wearing proper fitting shoes.  Spondylosis of lumbar region without myelopathy or radiculopathy: She is not having any lower back pain at this time.  No symptoms of radiculopathy.  No midline spinal tenderness.  Other fatigue -She continues to have chronic fatigue but it has been stable recently.  We will check a vitamin D level, CK, TSH today.  Plan: VITAMIN D 25 Hydroxy (Vit-D Deficiency, Fractures), CK, TSH  History of hypothyroidism -She is taking Synthroid as prescribed.  She requested to have TSH level checked today.   Plan: TSH  Vitamin D deficiency -She has history of vitamin D deficiency.  Her most recent vitamin D level was checked on 01/13/2018 and it was 67 at that time.  She requested a vitamin D level checked today.  She has been taking vitamin D 1000 units by mouth daily.  She was advised to check with her PCP when her last bone density was.  Plan: VITAMIN D 25 Hydroxy (Vit-D Deficiency, Fractures)  History of anxiety  Basal cell carcinoma (BCC), unspecified site - She is followed closely by dermatology.  Orders: Orders Placed This Encounter  Procedures  . XR Hand 2 View Left  . XR Hand 2 View Right  . CBC with Differential/Platelet  . COMPLETE METABOLIC PANEL WITH GFR  . VITAMIN D 25 Hydroxy (Vit-D Deficiency, Fractures)  . CK  . TSH  . CBC with Differential/Platelet  . COMPLETE METABOLIC PANEL WITH GFR  . CK   No orders of the defined types were placed in this encounter.   Face-to-face time spent with patient was 30 minutes. Greater than 50% of time was spent in counseling and coordination of care.  Follow-Up Instructions: Return in about 5 months (around 04/22/2020) for Dermatomyositis, Osteoarthritis, Sjogren's syndrome.   Hazel Sams, PA-C  I examined and evaluated the patient with Hazel Sams PA.  Patient has been experiencing increased pain and discomfort in her bilateral hands.  She no synovitis was noted.  She has severe osteoarthritis with DIP and PIP thickening.  Joint protection muscle strengthening was discussed.  X-ray findings were reviewed.  The plan of care was discussed as noted above.  Bo Merino, MD  Note - This record has been created using Editor, commissioning.  Chart creation errors have been sought, but may not always  have been located. Such creation errors do not reflect on  the standard of medical care.

## 2019-11-21 ENCOUNTER — Other Ambulatory Visit: Payer: Self-pay

## 2019-11-21 ENCOUNTER — Ambulatory Visit: Payer: Self-pay

## 2019-11-21 ENCOUNTER — Encounter: Payer: Self-pay | Admitting: Rheumatology

## 2019-11-21 ENCOUNTER — Ambulatory Visit: Payer: Medicare Other | Admitting: Rheumatology

## 2019-11-21 VITALS — BP 130/77 | HR 66 | Resp 15 | Ht 68.0 in | Wt 180.0 lb

## 2019-11-21 DIAGNOSIS — C4491 Basal cell carcinoma of skin, unspecified: Secondary | ICD-10-CM

## 2019-11-21 DIAGNOSIS — M19071 Primary osteoarthritis, right ankle and foot: Secondary | ICD-10-CM

## 2019-11-21 DIAGNOSIS — M19072 Primary osteoarthritis, left ankle and foot: Secondary | ICD-10-CM

## 2019-11-21 DIAGNOSIS — M3390 Dermatopolymyositis, unspecified, organ involvement unspecified: Secondary | ICD-10-CM

## 2019-11-21 DIAGNOSIS — Z79899 Other long term (current) drug therapy: Secondary | ICD-10-CM | POA: Diagnosis not present

## 2019-11-21 DIAGNOSIS — M47816 Spondylosis without myelopathy or radiculopathy, lumbar region: Secondary | ICD-10-CM

## 2019-11-21 DIAGNOSIS — M79641 Pain in right hand: Secondary | ICD-10-CM

## 2019-11-21 DIAGNOSIS — M19042 Primary osteoarthritis, left hand: Secondary | ICD-10-CM

## 2019-11-21 DIAGNOSIS — E559 Vitamin D deficiency, unspecified: Secondary | ICD-10-CM | POA: Diagnosis not present

## 2019-11-21 DIAGNOSIS — M7712 Lateral epicondylitis, left elbow: Secondary | ICD-10-CM

## 2019-11-21 DIAGNOSIS — Z8659 Personal history of other mental and behavioral disorders: Secondary | ICD-10-CM

## 2019-11-21 DIAGNOSIS — M79642 Pain in left hand: Secondary | ICD-10-CM

## 2019-11-21 DIAGNOSIS — M3501 Sicca syndrome with keratoconjunctivitis: Secondary | ICD-10-CM

## 2019-11-21 DIAGNOSIS — M19041 Primary osteoarthritis, right hand: Secondary | ICD-10-CM

## 2019-11-21 DIAGNOSIS — R5383 Other fatigue: Secondary | ICD-10-CM

## 2019-11-21 DIAGNOSIS — Z8639 Personal history of other endocrine, nutritional and metabolic disease: Secondary | ICD-10-CM | POA: Diagnosis not present

## 2019-11-21 DIAGNOSIS — Z87898 Personal history of other specified conditions: Secondary | ICD-10-CM

## 2019-11-21 NOTE — Patient Instructions (Addendum)
Hand Exercises Hand exercises can be helpful for almost anyone. These exercises can strengthen the hands, improve flexibility and movement, and increase blood flow to the hands. These results can make work and daily tasks easier. Hand exercises can be especially helpful for people who have joint pain from arthritis or have nerve damage from overuse (carpal tunnel syndrome). These exercises can also help people who have injured a hand. Exercises Most of these hand exercises are gentle stretching and motion exercises. It is usually safe to do them often throughout the day. Warming up your hands before exercise may help to reduce stiffness. You can do this with gentle massage or by placing your hands in warm water for 10-15 minutes. It is normal to feel some stretching, pulling, tightness, or mild discomfort as you begin new exercises. This will gradually improve. Stop an exercise right away if you feel sudden, severe pain or your pain gets worse. Ask your health care provider which exercises are best for you. Knuckle bend or "claw" fist 1. Stand or sit with your arm, hand, and all five fingers pointed straight up. Make sure to keep your wrist straight during the exercise. 2. Gently bend your fingers down toward your palm until the tips of your fingers are touching the top of your palm. Keep your big knuckle straight and just bend the small knuckles in your fingers. 3. Hold this position for __________ seconds. 4. Straighten (extend) your fingers back to the starting position. Repeat this exercise 5-10 times with each hand. Full finger fist 1. Stand or sit with your arm, hand, and all five fingers pointed straight up. Make sure to keep your wrist straight during the exercise. 2. Gently bend your fingers into your palm until the tips of your fingers are touching the middle of your palm. 3. Hold this position for __________ seconds. 4. Extend your fingers back to the starting position, stretching every  joint fully. Repeat this exercise 5-10 times with each hand. Straight fist 1. Stand or sit with your arm, hand, and all five fingers pointed straight up. Make sure to keep your wrist straight during the exercise. 2. Gently bend your fingers at the big knuckle, where your fingers meet your hand, and the middle knuckle. Keep the knuckle at the tips of your fingers straight and try to touch the bottom of your palm. 3. Hold this position for __________ seconds. 4. Extend your fingers back to the starting position, stretching every joint fully. Repeat this exercise 5-10 times with each hand. Tabletop 1. Stand or sit with your arm, hand, and all five fingers pointed straight up. Make sure to keep your wrist straight during the exercise. 2. Gently bend your fingers at the big knuckle, where your fingers meet your hand, as far down as you can while keeping the small knuckles in your fingers straight. Think of forming a tabletop with your fingers. 3. Hold this position for __________ seconds. 4. Extend your fingers back to the starting position, stretching every joint fully. Repeat this exercise 5-10 times with each hand. Finger spread 1. Place your hand flat on a table with your palm facing down. Make sure your wrist stays straight as you do this exercise. 2. Spread your fingers and thumb apart from each other as far as you can until you feel a gentle stretch. Hold this position for __________ seconds. 3. Bring your fingers and thumb tight together again. Hold this position for __________ seconds. Repeat this exercise 5-10 times with each hand.   Making circles 1. Stand or sit with your arm, hand, and all five fingers pointed straight up. Make sure to keep your wrist straight during the exercise. 2. Make a circle by touching the tip of your thumb to the tip of your index finger. 3. Hold for __________ seconds. Then open your hand wide. 4. Repeat this motion with your thumb and each finger on your  hand. Repeat this exercise 5-10 times with each hand. Thumb motion 1. Sit with your forearm resting on a table and your wrist straight. Your thumb should be facing up toward the ceiling. Keep your fingers relaxed as you move your thumb. 2. Lift your thumb up as high as you can toward the ceiling. Hold for __________ seconds. 3. Bend your thumb across your palm as far as you can, reaching the tip of your thumb for the small finger (pinkie) side of your palm. Hold for __________ seconds. Repeat this exercise 5-10 times with each hand. Grip strengthening  1. Hold a stress ball or other soft ball in the middle of your hand. 2. Slowly increase the pressure, squeezing the ball as much as you can without causing pain. Think of bringing the tips of your fingers into the middle of your palm. All of your finger joints should bend when doing this exercise. 3. Hold your squeeze for __________ seconds, then relax. Repeat this exercise 5-10 times with each hand. Contact a health care provider if:  Your hand pain or discomfort gets much worse when you do an exercise.  Your hand pain or discomfort does not improve within 2 hours after you exercise. If you have any of these problems, stop doing these exercises right away. Do not do them again unless your health care provider says that you can. Get help right away if:  You develop sudden, severe hand pain or swelling. If this happens, stop doing these exercises right away. Do not do them again unless your health care provider says that you can. This information is not intended to replace advice given to you by your health care provider. Make sure you discuss any questions you have with your health care provider. Document Revised: 12/29/2018 Document Reviewed: 09/08/2018 Elsevier Patient Education  [place] Ask your health care provider which exercises are safe for you. Do exercises exactly as told by your health care  provider and adjust them as directed. It is normal to feel mild stretching, pulling, tightness, or discomfort as you do these exercises. Stop right away if you feel sudden pain or your pain gets worse. Do not begin these exercises until told by your health care provider. Stretching and range-of-motion exercises These exercises warm up your muscles and joints and improve the movement and flexibility of your elbow. These exercises also help to relieve pain, numbness, and tingling. Wrist flexion, assisted  1. Straighten your left / right elbow in front of you with your palm facing down toward the floor. ? If told by your health care provider, bend your left / right elbow to a 90-degree angle (right angle) at your side. 2. With your other hand, gently push over the back of your left / right hand so your fingers point toward the floor (flexion). Stop when you feel a gentle stretch on the back of your forearm. 3. Hold this position for __________ seconds. Repeat __________ times. Complete this exercise __________ times a day. Wrist extension, assisted  1. Straighten your left / right elbow in front  of you with your palm facing up toward the ceiling. ? If told by your health care provider, bend your left / right elbow to a 90-degree angle (right angle) at your side. 2. With your other hand, gently pull your left / right hand and fingers toward the floor (extension). Stop when you feel a gentle stretch on the palm side of your forearm. 3. Hold this position for __________ seconds. Repeat __________ times. Complete this exercise __________ times a day. Assisted forearm rotation, supination 1. Sit or stand with your left / right elbow bent to a 90-degree angle (right angle) at your side. 2. Using your uninjured hand, turn (rotate) your left / right palm up toward the ceiling (supination) until you feel a gentle stretch along the inside of your forearm. 3. Hold this position for __________ seconds. Repeat  __________ times. Complete this exercise __________ times a day. Assisted forearm rotation, pronation 1. Sit or stand with your left / right elbow bent to a 90-degree angle (right angle) at your side. 2. Using your uninjured hand, rotate your left / right palm down toward the floor (pronation) until you feel a gentle stretch along the outside of your forearm. 3. Hold this position for __________ seconds. Repeat __________ times. Complete this exercise __________ times a day. Strengthening exercises These exercises build strength and endurance in your forearm and elbow. Endurance is the ability to use your muscles for a long time, even after they get tired. Radial deviation  1. Stand with a __________ weight or a hammer in your left / right hand. Or, sit while holding a rubber exercise band or tubing, with your left / right forearm supported on a table or countertop. ? If you are standing, position your forearm so that your thumb is facing forward. If you are sitting, position your forearm so that the thumb is facing the ceiling. This is the neutral position. 2. Raise your hand upward in front of you so your thumb moves toward the ceiling (radial deviation), or pull up on the rubber tubing. Keep your forearm and elbow still while you move your wrist only. 3. Hold this position for __________ seconds. 4. Slowly return to the starting position. Repeat __________ times. Complete this exercise __________ times a day. Wrist extension, eccentric 1. Sit with your left / right forearm palm-down and supported on a table or other surface. Let your left / right wrist extend over the edge of the surface. 2. Hold a __________ weight or a piece of exercise band or tubing in your left / right hand. ? If using a rubber exercise band or tubing, hold the other end of the tubing with your other hand. 3. Use your uninjured hand to move your left / right hand up toward the ceiling. 4. Take your uninjured hand away  and slowly return to the starting position using only your left / right hand. Lowering your arm under tension is called eccentric extension. Repeat __________ times. Complete this exercise __________ times a day. Wrist extension Do not do this exercise if it causes pain at the outside of your elbow. Only do this exercise once instructed by your health care provider. 1. Sit with your left / right forearm supported on a table or other surface and your palm turned down toward the floor. Let your left / right wrist extend over the edge of the surface. 2. Hold a __________ weight or a piece of rubber exercise band or tubing. ? If you are using a  rubber exercise band or tubing, hold the band or tubing in place with your other hand to provide resistance. 3. Slowly bend your wrist so your hand moves up toward the ceiling (extension). Move only your wrist, keeping your forearm and elbow still. 4. Hold this position for __________ seconds. 5. Slowly return to the starting position. Repeat __________ times. Complete this exercise __________ times a day. Forearm rotation, supination To do this exercise, you will need a lightweight hammer or rubber mallet. 1. Sit with your left / right forearm supported on a table or other surface. Bend your elbow to a 90-degree angle (right angle). Position your forearm so that your palm is facing down toward the floor, with your hand resting over the edge of the table. 2. Hold a hammer in your left / right hand. ? To make this exercise easier, hold the hammer near the head of the hammer. ? To make this exercise harder, hold the hammer near the end of the handle. 3. Without moving your wrist or elbow, slowly rotate your forearm so your palm faces up toward the ceiling (supination). 4. Hold this position for __________ seconds. 5. Slowly return to the starting position. Repeat __________ times. Complete this exercise __________ times a day. Shoulder blade squeeze 1. Sit in  a stable chair or stand with good posture. If you are sitting down, do not let your back touch the back of the chair. 2. Your arms should be at your sides with your elbows bent to a 90-degree angle (right angle). Position your forearms so that your thumbs are facing the ceiling (neutral position). 3. Without lifting your shoulders up, squeeze your shoulder blades tightly together. 4. Hold this position for __________ seconds. 5. Slowly release and return to the starting position. Repeat __________ times. Complete this exercise __________ times a day. This information is not intended to replace advice given to you by your health care provider. Make sure you discuss any questions you have with your health care provider. Document Revised: 12/29/2018 Document Reviewed: 11/01/2018 Elsevier Patient Education  [place].

## 2019-11-22 LAB — COMPLETE METABOLIC PANEL WITH GFR
AG Ratio: 1.8 (calc) (ref 1.0–2.5)
ALT: 12 U/L (ref 6–29)
AST: 23 U/L (ref 10–35)
Albumin: 4.4 g/dL (ref 3.6–5.1)
Alkaline phosphatase (APISO): 77 U/L (ref 37–153)
BUN: 12 mg/dL (ref 7–25)
CO2: 30 mmol/L (ref 20–32)
Calcium: 10.1 mg/dL (ref 8.6–10.4)
Chloride: 105 mmol/L (ref 98–110)
Creat: 0.64 mg/dL (ref 0.50–0.99)
GFR, Est African American: 110 mL/min/{1.73_m2} (ref >=60)
GFR, Est Non African American: 95 mL/min/{1.73_m2} (ref >=60)
Globulin: 2.5 g/dL (calc) (ref 1.9–3.7)
Glucose, Bld: 102 mg/dL — ABNORMAL HIGH (ref 65–99)
Potassium: 4.2 mmol/L (ref 3.5–5.3)
Sodium: 138 mmol/L (ref 135–146)
Total Bilirubin: 0.6 mg/dL (ref 0.2–1.2)
Total Protein: 6.9 g/dL (ref 6.1–8.1)

## 2019-11-22 LAB — CBC WITH DIFFERENTIAL/PLATELET
Absolute Monocytes: 383 cells/uL (ref 200–950)
Basophils Absolute: 41 cells/uL (ref 0–200)
Basophils Relative: 0.8 %
Eosinophils Absolute: 219 cells/uL (ref 15–500)
Eosinophils Relative: 4.3 %
HCT: 38.8 % (ref 35.0–45.0)
Hemoglobin: 13.2 g/dL (ref 11.7–15.5)
Lymphs Abs: 760 cells/uL — ABNORMAL LOW (ref 850–3900)
MCH: 29.7 pg (ref 27.0–33.0)
MCHC: 34 g/dL (ref 32.0–36.0)
MCV: 87.4 fL (ref 80.0–100.0)
MPV: 9.9 fL (ref 7.5–12.5)
Monocytes Relative: 7.5 %
Neutro Abs: 3698 cells/uL (ref 1500–7800)
Neutrophils Relative %: 72.5 %
Platelets: 247 10*3/uL (ref 140–400)
RBC: 4.44 10*6/uL (ref 3.80–5.10)
RDW: 12.6 % (ref 11.0–15.0)
Total Lymphocyte: 14.9 %
WBC: 5.1 10*3/uL (ref 3.8–10.8)

## 2019-11-22 LAB — VITAMIN D 25 HYDROXY (VIT D DEFICIENCY, FRACTURES): Vit D, 25-Hydroxy: 65 ng/mL (ref 30–100)

## 2019-11-22 LAB — CK: Total CK: 83 U/L (ref 29–143)

## 2019-11-22 LAB — TSH: TSH: 3.67 mIU/L (ref 0.40–4.50)

## 2019-11-22 NOTE — Progress Notes (Signed)
CBC and CMP WNL.  TSH WNL. Vitamin D WNL.  CK WNL.

## 2019-11-23 ENCOUNTER — Other Ambulatory Visit: Payer: Self-pay | Admitting: Rheumatology

## 2019-11-23 DIAGNOSIS — M3390 Dermatopolymyositis, unspecified, organ involvement unspecified: Secondary | ICD-10-CM

## 2020-01-30 ENCOUNTER — Other Ambulatory Visit: Payer: Self-pay | Admitting: *Deleted

## 2020-01-30 DIAGNOSIS — M3390 Dermatopolymyositis, unspecified, organ involvement unspecified: Secondary | ICD-10-CM

## 2020-01-30 MED ORDER — AZATHIOPRINE 50 MG PO TABS: 100.0000 mg | ORAL_TABLET | Freq: Every day | ORAL | 0 refills | Status: DC

## 2020-01-30 NOTE — Telephone Encounter (Signed)
Refill request received via fax  Last Visit: 11/21/2019 Next Visit: 04/23/2020 Labs: 11/21/2019 CBC and CMP WNL  Current Dose per office note on 11/21/2019: Imuran 100 mg by mouth daily  Okay to refill per Dr. Estanislado Pandy

## 2020-02-01 DIAGNOSIS — K219 Gastro-esophageal reflux disease without esophagitis: Secondary | ICD-10-CM | POA: Diagnosis not present

## 2020-02-01 DIAGNOSIS — Z800 Family history of malignant neoplasm of digestive organs: Secondary | ICD-10-CM | POA: Diagnosis not present

## 2020-02-01 DIAGNOSIS — Z1211 Encounter for screening for malignant neoplasm of colon: Secondary | ICD-10-CM | POA: Diagnosis not present

## 2020-02-01 DIAGNOSIS — K582 Mixed irritable bowel syndrome: Secondary | ICD-10-CM | POA: Diagnosis not present

## 2020-02-01 DIAGNOSIS — K5730 Diverticulosis of large intestine without perforation or abscess without bleeding: Secondary | ICD-10-CM | POA: Diagnosis not present

## 2020-03-13 DIAGNOSIS — K5730 Diverticulosis of large intestine without perforation or abscess without bleeding: Secondary | ICD-10-CM | POA: Diagnosis not present

## 2020-03-13 DIAGNOSIS — Z1211 Encounter for screening for malignant neoplasm of colon: Secondary | ICD-10-CM | POA: Diagnosis not present

## 2020-04-01 ENCOUNTER — Other Ambulatory Visit: Payer: Self-pay | Admitting: Rheumatology

## 2020-04-01 DIAGNOSIS — M3390 Dermatopolymyositis, unspecified, organ involvement unspecified: Secondary | ICD-10-CM

## 2020-04-01 NOTE — Telephone Encounter (Signed)
Last Visit: 11/21/2019 Next Visit: 04/23/2020 Labs: 11/21/2019 CBC and CMP WNL. TSH WNL. Vitamin D WNL. CK WNL.  Current Dose per office note on 11/21/2019: Imuran 100 mg by mouth daily.  DX: Dermatomyositis   Attempted to contact patient and left message on machine to advise patient she is overdue to update labs.   Okay to refill 30 day supply of Imuran?

## 2020-04-01 NOTE — Telephone Encounter (Signed)
Ok to refill 30-day supply of Imuran until she has updated lab work.

## 2020-04-08 ENCOUNTER — Telehealth: Payer: Self-pay | Admitting: Rheumatology

## 2020-04-08 DIAGNOSIS — Z79899 Other long term (current) drug therapy: Secondary | ICD-10-CM

## 2020-04-08 DIAGNOSIS — M3390 Dermatopolymyositis, unspecified, organ involvement unspecified: Secondary | ICD-10-CM | POA: Diagnosis not present

## 2020-04-08 NOTE — Telephone Encounter (Signed)
Patient called requesting labwork orders be sent to [place].  Patient states she doesn't remember if it is a [facility] or [facility].  Patient states "it is the same place the office has sent orders to in the past."  Please advise.

## 2020-04-08 NOTE — Telephone Encounter (Signed)
Patient advised we have released her orders to [facility] in the past and that is where we have released them today per her request.

## 2020-04-09 LAB — COMPLETE METABOLIC PANEL WITH GFR
AG Ratio: 1.7 (calc) (ref 1.0–2.5)
ALT: 13 U/L (ref 6–29)
AST: 27 U/L (ref 10–35)
Albumin: 4.2 g/dL (ref 3.6–5.1)
Alkaline phosphatase (APISO): 93 U/L (ref 37–153)
BUN: 8 mg/dL (ref 7–25)
CO2: 30 mmol/L (ref 20–32)
Calcium: 9.8 mg/dL (ref 8.6–10.4)
Chloride: 104 mmol/L (ref 98–110)
Creat: 0.59 mg/dL (ref 0.50–0.99)
GFR, Est African American: 113 mL/min/{1.73_m2} (ref >=60)
GFR, Est Non African American: 98 mL/min/{1.73_m2} (ref >=60)
Globulin: 2.5 g/dL (calc) (ref 1.9–3.7)
Glucose, Bld: 93 mg/dL (ref 65–139)
Potassium: 3.7 mmol/L (ref 3.5–5.3)
Sodium: 138 mmol/L (ref 135–146)
Total Bilirubin: 0.6 mg/dL (ref 0.2–1.2)
Total Protein: 6.7 g/dL (ref 6.1–8.1)

## 2020-04-09 LAB — CBC WITH DIFFERENTIAL/PLATELET
Absolute Monocytes: 771 cells/uL (ref 200–950)
Basophils Absolute: 38 cells/uL (ref 0–200)
Basophils Relative: 0.4 %
Eosinophils Absolute: 376 cells/uL (ref 15–500)
Eosinophils Relative: 4 %
HCT: 34.6 % — ABNORMAL LOW (ref 35.0–45.0)
Hemoglobin: 11.6 g/dL — ABNORMAL LOW (ref 11.7–15.5)
Lymphs Abs: 1034 cells/uL (ref 850–3900)
MCH: 28.9 pg (ref 27.0–33.0)
MCHC: 33.5 g/dL (ref 32.0–36.0)
MCV: 86.1 fL (ref 80.0–100.0)
MPV: 9.8 fL (ref 7.5–12.5)
Monocytes Relative: 8.2 %
Neutro Abs: 7182 cells/uL (ref 1500–7800)
Neutrophils Relative %: 76.4 %
Platelets: 319 10*3/uL (ref 140–400)
RBC: 4.02 10*6/uL (ref 3.80–5.10)
RDW: 12.1 % (ref 11.0–15.0)
Total Lymphocyte: 11 %
WBC: 9.4 10*3/uL (ref 3.8–10.8)

## 2020-04-09 LAB — CK: Total CK: 105 U/L (ref 29–143)

## 2020-04-10 NOTE — Telephone Encounter (Signed)
Please forward lab work to PCP.

## 2020-04-10 NOTE — Progress Notes (Signed)
Office Visit Note  Patient: Carmen Woods             Date of Birth: 08/11/1956           MRN: [id number]             PCP: Glenda Chroman, MD Referring: Glenda Chroman, MD Visit Date: 04/23/2020 Occupation: @GUAROCC @  Subjective:  Pain in both hands  History of Present Illness: [name] is a 64 y.o. female with history of dermatomyositis, osteoarthritis, and Sjogren syndrome.  She is taking Imuran 100 mg by mouth daily.  She continues to tolerate Imuran without any side effects.  She denies any signs or symptoms of a dermatomyositis flare.  She denies any muscle weakness or muscle tenderness at this time.  She has no difficulty rising from a seated position or raising her arms above her head.  She denies any recent rashes.  She continues to have chronic pain and stiffness in both hands especially first thing in the morning.  She denies any joint swelling at this time.  She has persistent lateral epicondylitis of the left elbow and tried wearing a brace as well as performing stretching exercises without much relief.  According to the patient she wear the brace above her left elbow which was the incorrect placement.  She has persistent neck stiffness but denies any pain or symptoms of radiculopathy at this time.  She continues to have chronic sicca symptoms, which have been worsening this summer.  She has tried increasing her fluid intake but has not been using her humidifier.    Activities of Daily Living:  Patient reports morning stiffness for 2 hours.   Patient Denies nocturnal pain.  Difficulty dressing/grooming: Reports Difficulty climbing stairs: Reports Difficulty getting out of chair: Denies Difficulty using hands for taps, buttons, cutlery, and/or writing: Reports  Review of Systems  Constitutional: Positive for fatigue.  HENT: Positive for mouth dryness. Negative for mouth sores and nose dryness.   Eyes: Positive for dryness. Negative for pain and visual  disturbance.  Respiratory: Negative for cough, hemoptysis, shortness of breath and difficulty breathing.   Cardiovascular: Negative for chest pain, palpitations, hypertension and swelling in legs/feet.  Gastrointestinal: Positive for diarrhea. Negative for blood in stool and constipation.  Endocrine: Negative for increased urination.  Genitourinary: Negative for difficulty urinating and painful urination.  Musculoskeletal: Positive for arthralgias, joint pain, joint swelling, muscle weakness and morning stiffness. Negative for myalgias, muscle tenderness and myalgias.  Skin: Negative for color change, pallor, rash, hair loss, nodules/bumps, skin tightness, ulcers and sensitivity to sunlight.  Allergic/Immunologic: Negative for susceptible to infections.  Neurological: Negative for dizziness, numbness and headaches.  Hematological: Negative for bruising/bleeding tendency and swollen glands.  Psychiatric/Behavioral: Negative for depressed mood and sleep disturbance. The patient is not nervous/anxious.     PMFS History:  Patient Active Problem List   Diagnosis Date Noted  . Basal cell carcinoma (BCC) 08/25/2017  . Primary osteoarthritis of both hands 03/18/2017  . Primary osteoarthritis of both feet 03/18/2017  . High risk medication use 11/10/2016  . Galtron's Papules 11/10/2016  . Other fatigue 11/10/2016  . Spondylosis of lumbar region without myelopathy or radiculopathy 11/10/2016  . History of anxiety 11/10/2016  . Vitamin D deficiency 11/10/2016  . History of vertigo 11/10/2016  . Sjogren's syndrome with keratoconjunctivitis sicca ([place]) 11/10/2016  . History of hypothyroidism 11/10/2016  . Dermatomyositis ([place]) 01/07/2015  . Mitral regurgitation 01/19/2013  . Precordial pain 01/19/2013  Past Medical History:  Diagnosis Date  . DDD (degenerative disc disease), lumbar   . Dermatomyositis ([place]) 12/2014  . Difficulty swallowing   . Dry eyes   . Dry mouth   . Hypothyroidism     . Mass of left thigh 12/2014  . Rash    hands, left thigh    Family History  Problem Relation Age of Onset  . Heart Problems Mother        [place]  . Breast cancer Mother   . Diabetes Father   . Osteoarthritis Sister   . Kidney disease Sister 68  . Rheum arthritis Sister   . Thyroid disease Sister    Past Surgical History:  Procedure Laterality Date  . BACK SURGERY  1998  . BELPHAROPTOSIS REPAIR Bilateral 11/2014  . MUSCLE BIOPSY Left 01/07/2015   Procedure: LEFT THIGH MUSCLE BIOPSY;  Surgeon: Fanny Skates, MD;  Location: [facility];  Service: General;  Laterality: Left;  . skin cancer removal Right 2020   hand  . skin cancer removal   07/2017   Social History   Social History Narrative  . Not on file   Immunization History  Administered Date(s) Administered  . Influenza,inj,quad, With Preservative 07/12/2017     Objective: Vital Signs: BP 126/68 (BP Location: Left Arm, Patient Position: Sitting, Cuff Size: Normal)   Pulse (!) 57   Resp 16   Ht 5\' 8"  (1.727 m)   Wt 176 lb 12.8 oz (80.2 kg)   BMI 26.88 kg/m    Physical Exam Vitals and nursing note reviewed.  Constitutional:      Appearance: She is well-developed.  HENT:     Head: Normocephalic and atraumatic.  Eyes:     Conjunctiva/sclera: Conjunctivae normal.  Pulmonary:     Effort: Pulmonary effort is normal.  Abdominal:     General: Bowel sounds are normal.     Palpations: Abdomen is soft.  Musculoskeletal:     Cervical back: Normal range of motion.  Lymphadenopathy:     Cervical: No cervical adenopathy.  Skin:    General: Skin is warm and dry.     Capillary Refill: Capillary refill takes less than 2 seconds.  Neurological:     Mental Status: She is alert and oriented to person, place, and time.  Psychiatric:        Behavior: Behavior normal.      Musculoskeletal Exam: C-spine limited ROM with lateral rotation bilaterally.  Thoracic and lumbar spine good ROM.  Shoulder  joints, elbow joints, wrist joints, MCPs, PIPs, and DIPs good ROM with no synovitis.  Complete fist formation bilaterally.  Hip joints, knee joints, and ankle joints good ROM with no synovitis.  No warmth or effusion of knee joints.  Bilateral knee crepitus.  No tenderness or inflammation of ankle joints.    CDAI Exam: CDAI Score: -- Patient Global: --; Provider Global: -- Swollen: --; Tender: -- Joint Exam 04/23/2020   No joint exam has been documented for this visit   There is currently no information documented on the homunculus. Go to the Rheumatology activity and complete the homunculus joint exam.  Investigation: No additional findings.  Imaging: No results found.  Recent Labs: Lab Results  Component Value Date   WBC 9.4 04/08/2020   HGB 11.6 (L) 04/08/2020   PLT 319 04/08/2020   NA 138 04/08/2020   K 3.7 04/08/2020   CL 104 04/08/2020   CO2 30 04/08/2020   GLUCOSE 93 04/08/2020   BUN 8  04/08/2020   CREATININE 0.59 04/08/2020   BILITOT 0.6 04/08/2020   ALKPHOS 99 03/22/2017   AST 27 04/08/2020   ALT 13 04/08/2020   PROT 6.7 04/08/2020   ALBUMIN 4.3 03/22/2017   CALCIUM 9.8 04/08/2020   GFRAA 113 04/08/2020    Speciality Comments: No specialty comments available.  Procedures:  No procedures performed Allergies: Codeine and Hydrocodone   Assessment / Plan:     Visit Diagnoses: Dermatomyositis ([place]) - Positive biopsy May 2016, Ro 52 positive, elevated CK and aldolase, Gottron's papules, muscle weakness, fatigue, positive Ro, positive anti-chromatin: She has not had any signs or symptoms of a flare recently.  She is clinically doing well on Imuran 100 mg by mouth daily.  She is tolerating Imuran without any side effects.  She has not had any muscle weakness, myalgias, muscle tenderness recently.  She had full strength of upper and lower extremities on examination today.  She had no difficulty rising from a seated position or raising her arms above her head.  She has  not had any recent rashes.  Her CK was 105 on 04/08/2020.  We will continue to monitor CBC, CMP, and CK every 3 months.  She was advised to notify us if she develops any new or worsening symptoms.  She will continue taking Imuran as prescribed.  She will follow-up in the office in 5 months.  High risk medication use - Imuran 100 mg by mouth daily.  CBC and CMP were drawn on 04/08/2020.  Lab work was reviewed with the patient today in the office and all questions were addressed.  She will be due to update lab work in October and every 3 months to monitor for drug toxicity. She was advised to hold Imuran if she develop signs or symptoms of an infection and to resume once the infection has completely cleared.  She has received both COVID-19 vaccinations.  We discussed the importance of wearing a mask and social distancing.  We also discussed that if she develops COVID-19 infection she is to notify us or her PCP to receive the COVID-19 antibody infusion since she is high risk.  Sjogren's syndrome with keratoconjunctivitis sicca ([place]): She has chronic sicca symptoms.  Her symptoms have been worsening this past summer.  She has been trying to increase her fluid intake.  I discussed the use of a humidifier and over-the-counter products for symptomatic relief.  Primary osteoarthritis of both hands: She has PIP and DIP thickening consistent with osteoarthritis of both hands.  She has difficulty making a complete fist due to the stiffness she is experiencing.  We discussed the importance of joint protection and muscle strengthening.  She was encouraged to perform hand exercises on a daily basis to increase her grip strength and fine motor movements.   Lateral epicondylitis, left elbow: She has tenderness to palpation on exam.  Her symptoms have persisted despite performing stretching exercises.  She purchased a brace but was wearing it superior to her left elbow.  We discussed the correct positioning for the brace and  she is going to try that as well as increasing the range of motion exercises.  She is not a good candidate for a cortisone injection due to recent use of Cipro which carries an increased risk for tendon rupture.  We discussed that if her symptoms persist or worsen we can refer her to physical therapy.  She was encouraged to use Voltaren gel topically as needed for pain relief.  Primary osteoarthritis of both feet:  She is not having any discomfort in her feet at this time.  She has PIP and DIP thickening consistent osteoarthritis of both feet.  She is wearing proper fitting shoes.  Spondylosis of lumbar region without myelopathy or radiculopathy: She is not having any discomfort in her lower back at this time.  No symptoms of radiculopathy.   Other fatigue: Stable.   Other medical conditions are listed as follows:   Basal cell carcinoma (BCC), unspecified site - She is followed closely by dermatology.  History of hypothyroidism - She is taking Synthroid as prescribed.   History of anxiety  Vitamin D deficiency  Orders: No orders of the defined types were placed in this encounter.  No orders of the defined types were placed in this encounter.   Face-to-face time spent with patient was 30 minutes. Greater than 50% of time was spent in counseling and coordination of care.  Follow-Up Instructions: Return in about 5 months (around 09/23/2020) for Dermatomyositis , Sjogren's syndrome, Osteoarthritis.   Ofilia Neas, PA-C  Note - This record has been created using Dragon software.  Chart creation errors have been sought, but may not always  have been located. Such creation errors do not reflect on  the standard of medical care.

## 2020-04-10 NOTE — Telephone Encounter (Signed)
CK WNL.  CMP WNL.  Hgb and hct are borderline low.  Please notify the patient and ask if she has had any recent surgeries/procedures or if she has noticed any blood in her stool.

## 2020-04-11 DIAGNOSIS — D849 Immunodeficiency, unspecified: Secondary | ICD-10-CM | POA: Diagnosis not present

## 2020-04-11 DIAGNOSIS — E039 Hypothyroidism, unspecified: Secondary | ICD-10-CM | POA: Diagnosis not present

## 2020-04-11 DIAGNOSIS — I10 Essential (primary) hypertension: Secondary | ICD-10-CM | POA: Diagnosis not present

## 2020-04-11 DIAGNOSIS — M3390 Dermatopolymyositis, unspecified, organ involvement unspecified: Secondary | ICD-10-CM | POA: Diagnosis not present

## 2020-04-11 DIAGNOSIS — R1032 Left lower quadrant pain: Secondary | ICD-10-CM | POA: Diagnosis not present

## 2020-04-11 DIAGNOSIS — Z299 Encounter for prophylactic measures, unspecified: Secondary | ICD-10-CM | POA: Diagnosis not present

## 2020-04-23 ENCOUNTER — Ambulatory Visit: Payer: Medicare Other | Admitting: Physician Assistant

## 2020-04-23 ENCOUNTER — Other Ambulatory Visit: Payer: Self-pay

## 2020-04-23 ENCOUNTER — Encounter: Payer: Self-pay | Admitting: Physician Assistant

## 2020-04-23 VITALS — BP 126/68 | HR 57 | Resp 16 | Ht 68.0 in | Wt 176.8 lb

## 2020-04-23 DIAGNOSIS — M47816 Spondylosis without myelopathy or radiculopathy, lumbar region: Secondary | ICD-10-CM

## 2020-04-23 DIAGNOSIS — Z8659 Personal history of other mental and behavioral disorders: Secondary | ICD-10-CM

## 2020-04-23 DIAGNOSIS — M19041 Primary osteoarthritis, right hand: Secondary | ICD-10-CM | POA: Diagnosis not present

## 2020-04-23 DIAGNOSIS — M3390 Dermatopolymyositis, unspecified, organ involvement unspecified: Secondary | ICD-10-CM

## 2020-04-23 DIAGNOSIS — M19071 Primary osteoarthritis, right ankle and foot: Secondary | ICD-10-CM

## 2020-04-23 DIAGNOSIS — M7712 Lateral epicondylitis, left elbow: Secondary | ICD-10-CM | POA: Diagnosis not present

## 2020-04-23 DIAGNOSIS — Z8639 Personal history of other endocrine, nutritional and metabolic disease: Secondary | ICD-10-CM

## 2020-04-23 DIAGNOSIS — M19042 Primary osteoarthritis, left hand: Secondary | ICD-10-CM

## 2020-04-23 DIAGNOSIS — M19072 Primary osteoarthritis, left ankle and foot: Secondary | ICD-10-CM

## 2020-04-23 DIAGNOSIS — C4491 Basal cell carcinoma of skin, unspecified: Secondary | ICD-10-CM

## 2020-04-23 DIAGNOSIS — M3501 Sicca syndrome with keratoconjunctivitis: Secondary | ICD-10-CM

## 2020-04-23 DIAGNOSIS — Z79899 Other long term (current) drug therapy: Secondary | ICD-10-CM | POA: Diagnosis not present

## 2020-04-23 DIAGNOSIS — R5383 Other fatigue: Secondary | ICD-10-CM

## 2020-04-23 DIAGNOSIS — E559 Vitamin D deficiency, unspecified: Secondary | ICD-10-CM

## 2020-04-23 NOTE — Patient Instructions (Signed)

## 2020-04-29 ENCOUNTER — Other Ambulatory Visit: Payer: Self-pay | Admitting: *Deleted

## 2020-04-29 DIAGNOSIS — M3390 Dermatopolymyositis, unspecified, organ involvement unspecified: Secondary | ICD-10-CM

## 2020-04-29 MED ORDER — AZATHIOPRINE 50 MG PO TABS: 100.0000 mg | ORAL_TABLET | Freq: Every day | ORAL | 0 refills | Status: DC

## 2020-04-29 NOTE — Telephone Encounter (Signed)
Last Visit: 04/23/2020 Next Visit: 09/26/2020 Labs: 04/08/2020 CMP WNL. Hgb and hct are borderline low.   Current Dose per office note 04/23/2020: Imuran 100 mg by mouth daily DX:  Dermatomyositis  Okay to refill Imuran?

## 2020-05-01 DIAGNOSIS — Z789 Other specified health status: Secondary | ICD-10-CM | POA: Diagnosis not present

## 2020-05-01 DIAGNOSIS — Z0000 Encounter for general adult medical examination without abnormal findings: Secondary | ICD-10-CM | POA: Diagnosis not present

## 2020-05-01 DIAGNOSIS — Z299 Encounter for prophylactic measures, unspecified: Secondary | ICD-10-CM | POA: Diagnosis not present

## 2020-05-01 DIAGNOSIS — I10 Essential (primary) hypertension: Secondary | ICD-10-CM | POA: Diagnosis not present

## 2020-05-01 DIAGNOSIS — Z7189 Other specified counseling: Secondary | ICD-10-CM | POA: Diagnosis not present

## 2020-05-02 DIAGNOSIS — R5383 Other fatigue: Secondary | ICD-10-CM | POA: Diagnosis not present

## 2020-05-02 DIAGNOSIS — Z79899 Other long term (current) drug therapy: Secondary | ICD-10-CM | POA: Diagnosis not present

## 2020-05-02 DIAGNOSIS — E039 Hypothyroidism, unspecified: Secondary | ICD-10-CM | POA: Diagnosis not present

## 2020-05-24 DIAGNOSIS — J069 Acute upper respiratory infection, unspecified: Secondary | ICD-10-CM | POA: Diagnosis not present

## 2020-05-24 DIAGNOSIS — Z299 Encounter for prophylactic measures, unspecified: Secondary | ICD-10-CM | POA: Diagnosis not present

## 2020-05-24 DIAGNOSIS — I10 Essential (primary) hypertension: Secondary | ICD-10-CM | POA: Diagnosis not present

## 2020-05-24 DIAGNOSIS — J309 Allergic rhinitis, unspecified: Secondary | ICD-10-CM | POA: Diagnosis not present

## 2020-07-12 ENCOUNTER — Other Ambulatory Visit: Payer: Self-pay | Admitting: Physician Assistant

## 2020-07-12 DIAGNOSIS — M3390 Dermatopolymyositis, unspecified, organ involvement unspecified: Secondary | ICD-10-CM

## 2020-07-15 NOTE — Telephone Encounter (Signed)
Last Visit: 04/23/2020 Next Visit: 09/26/2020 Labs: 04/08/2020 CMP WNL. Hgb and hct are borderline low.   Current Dose per office note 04/23/2020: Imuran 100 mg by mouth daily DX: Dermatomyositis  Left message to advise patient she is due to update labs.   Okay to refill Imuran?

## 2020-07-16 ENCOUNTER — Telehealth: Payer: Self-pay

## 2020-07-16 DIAGNOSIS — M3390 Dermatopolymyositis, unspecified, organ involvement unspecified: Secondary | ICD-10-CM

## 2020-07-16 DIAGNOSIS — Z79899 Other long term (current) drug therapy: Secondary | ICD-10-CM

## 2020-07-16 NOTE — Telephone Encounter (Signed)
Patient called requesting labwork orders be sent to [facility] in [place].  Patient is on her way now.

## 2020-07-16 NOTE — Telephone Encounter (Signed)
Lab Orders released.  

## 2020-07-18 DIAGNOSIS — Z79899 Other long term (current) drug therapy: Secondary | ICD-10-CM | POA: Diagnosis not present

## 2020-07-18 DIAGNOSIS — M3390 Dermatopolymyositis, unspecified, organ involvement unspecified: Secondary | ICD-10-CM | POA: Diagnosis not present

## 2020-07-19 LAB — COMPLETE METABOLIC PANEL WITH GFR
AG Ratio: 1.7 (calc) (ref 1.0–2.5)
ALT: 14 U/L (ref 6–29)
AST: 26 U/L (ref 10–35)
Albumin: 4.5 g/dL (ref 3.6–5.1)
Alkaline phosphatase (APISO): 82 U/L (ref 37–153)
BUN/Creatinine Ratio: 9 (calc) (ref 6–22)
BUN: 6 mg/dL — ABNORMAL LOW (ref 7–25)
CO2: 32 mmol/L (ref 20–32)
Calcium: 10.2 mg/dL (ref 8.6–10.4)
Chloride: 106 mmol/L (ref 98–110)
Creat: 0.68 mg/dL (ref 0.50–0.99)
GFR, Est African American: 108 mL/min/{1.73_m2} (ref >=60)
GFR, Est Non African American: 93 mL/min/{1.73_m2} (ref >=60)
Globulin: 2.6 g/dL (calc) (ref 1.9–3.7)
Glucose, Bld: 95 mg/dL (ref 65–99)
Potassium: 4.1 mmol/L (ref 3.5–5.3)
Sodium: 141 mmol/L (ref 135–146)
Total Bilirubin: 0.8 mg/dL (ref 0.2–1.2)
Total Protein: 7.1 g/dL (ref 6.1–8.1)

## 2020-07-19 LAB — CBC WITH DIFFERENTIAL/PLATELET
Absolute Monocytes: 473 cells/uL (ref 200–950)
Basophils Absolute: 52 cells/uL (ref 0–200)
Basophils Relative: 1.2 %
Eosinophils Absolute: 172 cells/uL (ref 15–500)
Eosinophils Relative: 4 %
HCT: 38 % (ref 35.0–45.0)
Hemoglobin: 13.3 g/dL (ref 11.7–15.5)
Lymphs Abs: 963 cells/uL (ref 850–3900)
MCH: 30.3 pg (ref 27.0–33.0)
MCHC: 35 g/dL (ref 32.0–36.0)
MCV: 86.6 fL (ref 80.0–100.0)
MPV: 10.2 fL (ref 7.5–12.5)
Monocytes Relative: 11 %
Neutro Abs: 2640 cells/uL (ref 1500–7800)
Neutrophils Relative %: 61.4 %
Platelets: 247 10*3/uL (ref 140–400)
RBC: 4.39 10*6/uL (ref 3.80–5.10)
RDW: 12.9 % (ref 11.0–15.0)
Total Lymphocyte: 22.4 %
WBC: 4.3 10*3/uL (ref 3.8–10.8)

## 2020-07-19 LAB — CK: Total CK: 150 U/L — ABNORMAL HIGH (ref 29–143)

## 2020-07-19 NOTE — Telephone Encounter (Signed)
CK is borderline elevated-150, gradually trending up.  Please advise the patient to recheck CK in 1 month.   BUN borderline low, rest of CMP WNL.  CBC WNL.

## 2020-07-22 ENCOUNTER — Telehealth: Payer: Self-pay | Admitting: Rheumatology

## 2020-07-22 NOTE — Telephone Encounter (Signed)
She can take 2,000 units of vitamin D daily.

## 2020-07-22 NOTE — Telephone Encounter (Signed)
Patient advised she can take 2,000 units of vitamin D daily.

## 2020-07-22 NOTE — Telephone Encounter (Signed)
Patient would like to know what mg of Vitamin D3 is she suppose to take daily? Please call to advise.

## 2020-08-25 ENCOUNTER — Other Ambulatory Visit: Payer: Self-pay | Admitting: Physician Assistant

## 2020-08-25 DIAGNOSIS — M3390 Dermatopolymyositis, unspecified, organ involvement unspecified: Secondary | ICD-10-CM

## 2020-08-26 NOTE — Telephone Encounter (Signed)
Last Visit:04/23/2020 Next Visit:09/26/2020 Labs:07/18/2020  BUN borderline low, rest of CMP WNL. CBC WNL.  Current Dose per office note8/11/2019:Imuran 100 mg by mouth daily SN:[id number]  Okay to refill Imuran?

## 2020-09-12 NOTE — Progress Notes (Signed)
Office Visit Note  Patient: Carmen Woods             Date of Birth: 07/08/1956           MRN: [id number]             PCP: Glenda Chroman, MD Referring: Glenda Chroman, MD Visit Date: 09/26/2020 Occupation: @GUAROCC @  Subjective:  Pain in both hands   History of Present Illness: Carmen Woods is a 64 y.o. female with history of dermatomyositis, sjogren's, and osteoarthritis. She is taking imuran 100 mg by mouth daily. She is tolerating imuran and has not missed any doses recently.  She denies any increased muscle weakness or muscle tenderness.  She denies any recent rashes.  She has been experiencing increased pain and stiffness in both hands.  She has also noticed increased pain in the left wrist radiating up her forearm.  She denies any swelling, warmth, or redness of the left wrist.  She denies any overuse activities.  She has been avoiding lifting her dog due to the discomfort.  She has tried using a brace without much relief. She has also been taking tylenol arthritis as needed for pain relief. She has occasional lower back pain which is exacerbated  She has ongoing sicca symptoms.  She has been using lozenges and OTC eye drops for symptomatic relief.  She denies any recent cavities.  She will need to establish care with a new dentist due to issues with insurance. She is also planning on reestablishing care at [facility] eye care.  She was previously on restasis which was helpful but she has ran out of the prescription.  She has received both covid-19 vaccines and plans on receiving the booster dose.  She completed a course of amoxicillin today for management of sinusitis.    Activities of Daily Living:  Patient reports morning stiffness for 2 hours  Patient Reports nocturnal pain.  Difficulty dressing/grooming: Denies Difficulty climbing stairs: Reports Difficulty getting out of chair: Reports Difficulty using hands for taps, buttons, cutlery, and/or writing: Reports    Review of Systems  Constitutional: Positive for fatigue.  HENT: Positive for mouth dryness. Negative for mouth sores and nose dryness.   Eyes: Positive for dryness. Negative for pain and visual disturbance.  Respiratory: Negative for cough, hemoptysis, shortness of breath and difficulty breathing.   Cardiovascular: Negative for chest pain, palpitations, hypertension and swelling in legs/feet.  Gastrointestinal: Positive for constipation and diarrhea. Negative for blood in stool.  Endocrine: Negative for increased urination.  Genitourinary: Negative for painful urination.  Musculoskeletal: Positive for arthralgias and joint pain. Negative for joint swelling, myalgias, muscle weakness, morning stiffness, muscle tenderness and myalgias.  Skin: Negative for color change, pallor, rash, hair loss, nodules/bumps, skin tightness, ulcers and sensitivity to sunlight.  Allergic/Immunologic: Negative for susceptible to infections.  Neurological: Negative for dizziness, numbness, headaches and weakness.  Hematological: Negative for swollen glands.  Psychiatric/Behavioral: Positive for sleep disturbance. Negative for depressed mood. The patient is not nervous/anxious.     PMFS History:  Patient Active Problem List   Diagnosis Date Noted  . Basal cell carcinoma (BCC) 08/25/2017  . Primary osteoarthritis of both hands 03/18/2017  . Primary osteoarthritis of both feet 03/18/2017  . High risk medication use 11/10/2016  . Galtron's Papules 11/10/2016  . Other fatigue 11/10/2016  . Spondylosis of lumbar region without myelopathy or radiculopathy 11/10/2016  . History of anxiety 11/10/2016  . Vitamin D deficiency 11/10/2016  . History of vertigo  11/10/2016  . Sjogren's syndrome with keratoconjunctivitis sicca ([place]) 11/10/2016  . History of hypothyroidism 11/10/2016  . Dermatomyositis ([place]) 01/07/2015  . Mitral regurgitation 01/19/2013  . Precordial pain 01/19/2013    Past Medical History:   Diagnosis Date  . DDD (degenerative disc disease), lumbar   . Dermatomyositis ([place]) 12/2014  . Difficulty swallowing   . Dry eyes   . Dry mouth   . Hypothyroidism   . Mass of left thigh 12/2014  . Rash    hands, left thigh    Family History  Problem Relation Age of Onset  . Heart Problems Mother        [place]  . Breast cancer Mother   . Diabetes Father   . Osteoarthritis Sister   . Kidney disease Sister 63  . Rheum arthritis Sister   . Thyroid disease Sister    Past Surgical History:  Procedure Laterality Date  . BACK SURGERY  1998  . BELPHAROPTOSIS REPAIR Bilateral 11/2014  . MUSCLE BIOPSY Left 01/07/2015   Procedure: LEFT THIGH MUSCLE BIOPSY;  Surgeon: Fanny Skates, MD;  Location: [facility];  Service: General;  Laterality: Left;  . skin cancer removal Right 2020   hand  . skin cancer removal   07/2017   Social History   Social History Narrative  . Not on file   Immunization History  Administered Date(s) Administered  . Influenza,inj,quad, With Preservative 07/12/2017     Objective: Vital Signs: BP (!) 144/75 (BP Location: Left Arm, Patient Position: Sitting, Cuff Size: Normal)   Pulse 66   Resp 16   Ht 5\' 7"  (1.702 m)   Wt 181 lb (82.1 kg)   BMI 28.35 kg/m    Physical Exam Vitals and nursing note reviewed.  Constitutional:      Appearance: She is well-developed and well-nourished.  HENT:     Head: Normocephalic and atraumatic.  Eyes:     Extraocular Movements: EOM normal.     Conjunctiva/sclera: Conjunctivae normal.  Cardiovascular:     Pulses: Intact distal pulses.  Pulmonary:     Effort: Pulmonary effort is normal.  Abdominal:     Palpations: Abdomen is soft.  Musculoskeletal:     Cervical back: Normal range of motion.  Skin:    General: Skin is warm and dry.     Capillary Refill: Capillary refill takes less than 2 seconds.  Neurological:     Mental Status: She is alert and oriented to person, place, and time.   Psychiatric:        Mood and Affect: Mood and affect normal.        Behavior: Behavior normal.      Musculoskeletal Exam: C-spine, thoracic spine, and lumbar spine good ROM.  Shoulder joints and elbow joints good ROM with no discomfort.  No tenderness over medial or lateral epicondyles.  De quervain's tenosynovitis of the left wrist.  PIP and DIP thickening consistent with osteoarthritis of both hands. [place] joint prominence bilaterally.  Hip joints, knee joints, ankle joints good ROM with no discomfort.  No warmth or effusion of knee joints.  No tenderness or swelling of ankle joints.  Full strength of upper and lower extremities.   CDAI Exam: CDAI Score: -- Patient Global: --; Provider Global: -- Swollen: --; Tender: -- Joint Exam 09/26/2020   No joint exam has been documented for this visit   There is currently no information documented on the homunculus. Go to the Rheumatology activity and complete the homunculus joint exam.  Investigation: No additional findings.  Imaging: No results found.  Recent Labs: Lab Results  Component Value Date   WBC 4.3 07/18/2020   HGB 13.3 07/18/2020   PLT 247 07/18/2020   NA 141 07/18/2020   K 4.1 07/18/2020   CL 106 07/18/2020   CO2 32 07/18/2020   GLUCOSE 95 07/18/2020   BUN 6 (L) 07/18/2020   CREATININE 0.68 07/18/2020   BILITOT 0.8 07/18/2020   ALKPHOS 99 03/22/2017   AST 26 07/18/2020   ALT 14 07/18/2020   PROT 7.1 07/18/2020   ALBUMIN 4.3 03/22/2017   CALCIUM 10.2 07/18/2020   GFRAA 108 07/18/2020    Speciality Comments: No specialty comments available.  Procedures:  No procedures performed Allergies: Codeine and Hydrocodone   Assessment / Plan:     Visit Diagnoses: Dermatomyositis ([place]) - Positive biopsy May 2016, Ro 52 positive, elevated CK and aldolase, Gottron's papules, muscle weakness, fatigue, positive Ro, positive anti-chromatin: She has not had any signs or symptoms of a flare recently.  She is clinically doing  well on Imuran 100 mg by mouth daily.  She has not noticed any increased muscle weakness or muscle tenderness.  She has full strength of upper and lower extremities on exam today.  No difficulty rising from a seated position or raising her arms above her head. CK was 150 on 07/18/20.  She was advised to return in November to recheck CK but she did not.  We will recheck CK today.  She will continue on imuran 100 mg by mouth daily. She was advised to notify us if she develops any signs or symptoms of a flare.  She will follow up in the office in 5 months.  - Plan: CK  High risk medication use - Imuran 100 mg by mouth daily.CBC and CMP updated on 07/18/20.  She is due to update CBC and CMP today.  Orders were released. Her next lab work will be due in April and every 3 months. Standing orders for CBC and CMP remain in place. She completed a course of amoxicillin today for management of sinusitis.  She has received both covid-19 vaccine doses and plans to receive the booster dose.   - Plan: CBC with Differential/Platelet, COMPLETE METABOLIC PANEL WITH GFR  Sjogren's syndrome with keratoconjunctivitis sicca ([place]): She has ongoing sicca symptoms. She has been using lozenges and OTC eye drops for symptomatic relief. She has not had any recent dental caries.  She will need to establish care with a new dentist due to changes in insurance. She will also be reestablishing care at [facility] eye care. Advised to schedule a dental and ophthalmology appointment.   Primary osteoarthritis of both hands: She has PIP and DIP thickening consistent with osteoarthritis of both hands.  [place] joint prominence bilaterally. She was given a handout of hand exercises to perform.   De Quervain's tenosynovitis, left: Positive finkelstein test today. She has tenderness to palpation on exam.  Discussed the diagnosis of de quervain's tenosynovitis. She was given a handout of information about the condition to review. Conservative treatment  options were discussed.  She was encouraged to use a brace, voltaren gel, and rest. She was advised to avoid overuse activities.  She was advised to notify us if she has persistent or worsening symptoms.   Lateral epicondylitis, left elbow: Resolved. No tenderness to palpation on exam.   Primary osteoarthritis of both feet: She is not experiencing any discomfort in her feet at this time.  She has good ROM  of both ankle joints with no discomfort.  She is wearing proper fitting shoes.   Spondylosis of lumbar region without myelopathy or radiculopathy: She has intermittent discomfort in her lower back, which is exacerbated by walking prolonged distances.   Other fatigue: Stable.   Other medical conditions are listed as follows:   History of hypothyroidism  Basal cell carcinoma (BCC), unspecified site - She is followed closely by dermatology.  History of anxiety  Vitamin D deficiency  Orders: Orders Placed This Encounter  Procedures  . CBC with Differential/Platelet  . COMPLETE METABOLIC PANEL WITH GFR  . CK   No orders of the defined types were placed in this encounter.    Follow-Up Instructions: Return in about 5 months (around 02/24/2021) for Dermatomyositis, Sjogren's syndrome, Osteoarthritis.   Ofilia Neas, PA-C  Note - This record has been created using Dragon software.  Chart creation errors have been sought, but may not always  have been located. Such creation errors do not reflect on  the standard of medical care.

## 2020-09-18 DIAGNOSIS — D692 Other nonthrombocytopenic purpura: Secondary | ICD-10-CM | POA: Diagnosis not present

## 2020-09-18 DIAGNOSIS — J329 Chronic sinusitis, unspecified: Secondary | ICD-10-CM | POA: Diagnosis not present

## 2020-09-18 DIAGNOSIS — I10 Essential (primary) hypertension: Secondary | ICD-10-CM | POA: Diagnosis not present

## 2020-09-18 DIAGNOSIS — Z299 Encounter for prophylactic measures, unspecified: Secondary | ICD-10-CM | POA: Diagnosis not present

## 2020-09-26 ENCOUNTER — Other Ambulatory Visit: Payer: Self-pay

## 2020-09-26 ENCOUNTER — Ambulatory Visit: Payer: Medicare Other | Admitting: Physician Assistant

## 2020-09-26 ENCOUNTER — Encounter: Payer: Self-pay | Admitting: Physician Assistant

## 2020-09-26 VITALS — BP 144/75 | HR 66 | Resp 16 | Ht 67.0 in | Wt 181.0 lb

## 2020-09-26 DIAGNOSIS — C4491 Basal cell carcinoma of skin, unspecified: Secondary | ICD-10-CM

## 2020-09-26 DIAGNOSIS — M7712 Lateral epicondylitis, left elbow: Secondary | ICD-10-CM

## 2020-09-26 DIAGNOSIS — M47816 Spondylosis without myelopathy or radiculopathy, lumbar region: Secondary | ICD-10-CM

## 2020-09-26 DIAGNOSIS — M19071 Primary osteoarthritis, right ankle and foot: Secondary | ICD-10-CM | POA: Diagnosis not present

## 2020-09-26 DIAGNOSIS — Z8639 Personal history of other endocrine, nutritional and metabolic disease: Secondary | ICD-10-CM

## 2020-09-26 DIAGNOSIS — R5383 Other fatigue: Secondary | ICD-10-CM

## 2020-09-26 DIAGNOSIS — M19041 Primary osteoarthritis, right hand: Secondary | ICD-10-CM

## 2020-09-26 DIAGNOSIS — M654 Radial styloid tenosynovitis [de Quervain]: Secondary | ICD-10-CM

## 2020-09-26 DIAGNOSIS — M19042 Primary osteoarthritis, left hand: Secondary | ICD-10-CM

## 2020-09-26 DIAGNOSIS — M3501 Sicca syndrome with keratoconjunctivitis: Secondary | ICD-10-CM

## 2020-09-26 DIAGNOSIS — Z8659 Personal history of other mental and behavioral disorders: Secondary | ICD-10-CM

## 2020-09-26 DIAGNOSIS — M19072 Primary osteoarthritis, left ankle and foot: Secondary | ICD-10-CM

## 2020-09-26 DIAGNOSIS — M3390 Dermatopolymyositis, unspecified, organ involvement unspecified: Secondary | ICD-10-CM

## 2020-09-26 DIAGNOSIS — Z79899 Other long term (current) drug therapy: Secondary | ICD-10-CM

## 2020-09-26 DIAGNOSIS — E559 Vitamin D deficiency, unspecified: Secondary | ICD-10-CM

## 2020-09-26 NOTE — Patient Instructions (Addendum)
Standing Labs We placed an order today for your standing lab work.   Please have your standing labs drawn in April and every 3 months   If possible, please have your labs drawn 2 weeks prior to your appointment so that the provider can discuss your results at your appointment.  We have open lab daily Monday through Thursday from 8:30-12:30 PM and 1:30-4:30 PM and Friday from 8:30-12:30 PM and 1:30-4:00 PM at the office of Dr. Bo Merino, [facility].   Please be advised, patients with office appointments requiring lab work will take precedents over walk-in lab work.  If possible, please come for your lab work on Monday and Friday afternoons, as you may experience shorter wait times. The office is located at [address], [place], [place], [place] [address] No appointment is necessary.   Labs are drawn by [facility]. Please bring your co-pay at the time of your lab draw.  You may receive a bill from [facility] for your lab work.  If you wish to have your labs drawn at another location, please call the office 24 hours in advance to send orders.  If you have any questions regarding directions or hours of operation,  please call [phone].   As a reminder, please drink plenty of water prior to coming for your lab work. Thanks!   De Quervain's Tenosynovitis  De Quervain's tenosynovitis is a condition that causes inflammation of the tendon on the thumb side of the wrist. Tendons are cords of tissue that connect bones to muscles. The tendons in the hand pass through a tunnel called a sheath. A slippery layer of tissue (synovium) lets the tendons move smoothly in the sheath. With de Quervain's tenosynovitis, the sheath swells or thickens, causing friction and pain. The condition is also called de Quervain's disease and de Quervain's syndrome. It occurs most often in women who are 33-56 years old. What are the causes? The exact cause of this condition is not known. It may be  associated with overuse of the hand and wrist. What increases the risk? You are more likely to develop this condition if you:  Use your hands far more than normal, especially if you repeat certain movements that involve twisting your hand or using a tight grip.  Are pregnant.  Are a middle-aged woman.  Have rheumatoid arthritis.  Have diabetes. What are the signs or symptoms? The main symptom of this condition is pain on the thumb side of the wrist. The pain may get worse when you grasp something or turn your wrist. Other symptoms may include:  Pain that extends up the forearm.  Swelling of your wrist and hand.  Trouble moving the thumb and wrist.  A sensation of snapping in the wrist.  A bump filled with fluid (cyst) in the area of the pain. How is this diagnosed? This condition may be diagnosed based on:  Your symptoms and medical history.  A physical exam. During the exam, your health care provider may do a simple test Wynn Maudlin test) that involves pulling your thumb and wrist to see if this causes pain. You may also need to have an X-ray. How is this treated? Treatment for this condition may include:  Avoiding any activity that causes pain and swelling.  Taking medicines. Anti-inflammatory medicines and corticosteroid injections may be used to reduce inflammation and relieve pain.  Wearing a splint.  Having surgery. This may be needed if other treatments do not work. Once the pain and swelling has gone  down:  Physical therapy. This includes stretching and strengthening exercises.  Occupational therapy. This includes adjusting how you move your wrist. Follow these instructions at home: If you have a splint:  Wear the splint as told by your health care provider. Remove it only as told by your health care provider.  Loosen the splint if your fingers tingle, become numb, or turn cold and blue.  Keep the splint clean.  If the splint is not waterproof: ? Do  not let it get wet. ? Cover it with a watertight covering when you take a bath or a shower. Managing pain, stiffness, and swelling   Avoid movements and activities that cause pain and swelling in the wrist area.  If directed, put ice on the painful area. This may be helpful after doing activities that involve the sore wrist. ? Put ice in a plastic bag. ? Place a towel between your skin and the bag. ? Leave the ice on for 20 minutes, 2-3 times a day.  Move your fingers often to avoid stiffness and to lessen swelling.  Raise (elevate) the injured area above the level of your heart while you are sitting or lying down. General instructions  Return to your normal activities as told by your health care provider. Ask your health care provider what activities are safe for you.  Take over-the-counter and prescription medicines only as told by your health care provider.  Keep all follow-up visits as told by your health care provider. This is important. Contact a health care provider if:  Your pain medicine does not help.  Your pain gets worse.  You develop new symptoms. Summary  De Quervain's tenosynovitis is a condition that causes inflammation of the tendon on the thumb side of the wrist.  The condition occurs most often in women who are 15-80 years old.  The exact cause of this condition is not known. It may be associated with overuse of the hand and wrist.  Treatment starts with avoiding activity that causes pain or swelling in the wrist area. Other treatment may include wearing a splint and taking medicine. Sometimes, surgery is needed. This information is not intended to replace advice given to you by your health care provider. Make sure you discuss any questions you have with your health care provider. Document Revised: 03/10/2018 Document Reviewed: 08/16/2017 Elsevier Patient Education  [place].   Hand Exercises Hand exercises can be helpful for almost anyone.  These exercises can strengthen the hands, improve flexibility and movement, and increase blood flow to the hands. These results can make work and daily tasks easier. Hand exercises can be especially helpful for people who have joint pain from arthritis or have nerve damage from overuse (carpal tunnel syndrome). These exercises can also help people who have injured a hand. Exercises Most of these hand exercises are gentle stretching and motion exercises. It is usually safe to do them often throughout the day. Warming up your hands before exercise may help to reduce stiffness. You can do this with gentle massage or by placing your hands in warm water for 10-15 minutes. It is normal to feel some stretching, pulling, tightness, or mild discomfort as you begin new exercises. This will gradually improve. Stop an exercise right away if you feel sudden, severe pain or your pain gets worse. Ask your health care provider which exercises are best for you. Knuckle bend or "claw" fist 1. Stand or sit with your arm, hand, and all five fingers pointed straight up.  Make sure to keep your wrist straight during the exercise. 2. Gently bend your fingers down toward your palm until the tips of your fingers are touching the top of your palm. Keep your big knuckle straight and just bend the small knuckles in your fingers. 3. Hold this position for __________ seconds. 4. Straighten (extend) your fingers back to the starting position. Repeat this exercise 5-10 times with each hand. Full finger fist 1. Stand or sit with your arm, hand, and all five fingers pointed straight up. Make sure to keep your wrist straight during the exercise. 2. Gently bend your fingers into your palm until the tips of your fingers are touching the middle of your palm. 3. Hold this position for __________ seconds. 4. Extend your fingers back to the starting position, stretching every joint fully. Repeat this exercise 5-10 times with each  hand. Straight fist 1. Stand or sit with your arm, hand, and all five fingers pointed straight up. Make sure to keep your wrist straight during the exercise. 2. Gently bend your fingers at the big knuckle, where your fingers meet your hand, and the middle knuckle. Keep the knuckle at the tips of your fingers straight and try to touch the bottom of your palm. 3. Hold this position for __________ seconds. 4. Extend your fingers back to the starting position, stretching every joint fully. Repeat this exercise 5-10 times with each hand. Tabletop 1. Stand or sit with your arm, hand, and all five fingers pointed straight up. Make sure to keep your wrist straight during the exercise. 2. Gently bend your fingers at the big knuckle, where your fingers meet your hand, as far down as you can while keeping the small knuckles in your fingers straight. Think of forming a tabletop with your fingers. 3. Hold this position for __________ seconds. 4. Extend your fingers back to the starting position, stretching every joint fully. Repeat this exercise 5-10 times with each hand. Finger spread 1. Place your hand flat on a table with your palm facing down. Make sure your wrist stays straight as you do this exercise. 2. Spread your fingers and thumb apart from each other as far as you can until you feel a gentle stretch. Hold this position for __________ seconds. 3. Bring your fingers and thumb tight together again. Hold this position for __________ seconds. Repeat this exercise 5-10 times with each hand. Making circles 1. Stand or sit with your arm, hand, and all five fingers pointed straight up. Make sure to keep your wrist straight during the exercise. 2. Make a circle by touching the tip of your thumb to the tip of your index finger. 3. Hold for __________ seconds. Then open your hand wide. 4. Repeat this motion with your thumb and each finger on your hand. Repeat this exercise 5-10 times with each hand. Thumb  motion 1. Sit with your forearm resting on a table and your wrist straight. Your thumb should be facing up toward the ceiling. Keep your fingers relaxed as you move your thumb. 2. Lift your thumb up as high as you can toward the ceiling. Hold for __________ seconds. 3. Bend your thumb across your palm as far as you can, reaching the tip of your thumb for the small finger (pinkie) side of your palm. Hold for __________ seconds. Repeat this exercise 5-10 times with each hand. Grip strengthening  1. Hold a stress ball or other soft ball in the middle of your hand. 2. Slowly increase the pressure, squeezing  the ball as much as you can without causing pain. Think of bringing the tips of your fingers into the middle of your palm. All of your finger joints should bend when doing this exercise. 3. Hold your squeeze for __________ seconds, then relax. Repeat this exercise 5-10 times with each hand. Contact a health care provider if:  Your hand pain or discomfort gets much worse when you do an exercise.  Your hand pain or discomfort does not improve within 2 hours after you exercise. If you have any of these problems, stop doing these exercises right away. Do not do them again unless your health care provider says that you can. Get help right away if:  You develop sudden, severe hand pain or swelling. If this happens, stop doing these exercises right away. Do not do them again unless your health care provider says that you can. This information is not intended to replace advice given to you by your health care provider. Make sure you discuss any questions you have with your health care provider. Document Revised: 12/29/2018 Document Reviewed: 09/08/2018 Elsevier Patient Education  [place].

## 2020-09-27 ENCOUNTER — Telehealth: Payer: Self-pay | Admitting: *Deleted

## 2020-09-27 DIAGNOSIS — Z79899 Other long term (current) drug therapy: Secondary | ICD-10-CM

## 2020-09-27 LAB — COMPLETE METABOLIC PANEL WITH GFR
AG Ratio: 1.9 (calc) (ref 1.0–2.5)
ALT: 13 U/L (ref 6–29)
AST: 26 U/L (ref 10–35)
Albumin: 4.5 g/dL (ref 3.6–5.1)
Alkaline phosphatase (APISO): 90 U/L (ref 37–153)
BUN: 8 mg/dL (ref 7–25)
CO2: 31 mmol/L (ref 20–32)
Calcium: 10.2 mg/dL (ref 8.6–10.4)
Chloride: 105 mmol/L (ref 98–110)
Creat: 0.7 mg/dL (ref 0.50–0.99)
GFR, Est African American: 106 mL/min/{1.73_m2} (ref >=60)
GFR, Est Non African American: 92 mL/min/{1.73_m2} (ref >=60)
Globulin: 2.4 g/dL (calc) (ref 1.9–3.7)
Glucose, Bld: 105 mg/dL — ABNORMAL HIGH (ref 65–99)
Potassium: 4.6 mmol/L (ref 3.5–5.3)
Sodium: 141 mmol/L (ref 135–146)
Total Bilirubin: 0.6 mg/dL (ref 0.2–1.2)
Total Protein: 6.9 g/dL (ref 6.1–8.1)

## 2020-09-27 LAB — CBC WITH DIFFERENTIAL/PLATELET
Absolute Monocytes: 400 cells/uL (ref 200–950)
Basophils Absolute: 38 cells/uL (ref 0–200)
Basophils Relative: 0.7 %
Eosinophils Absolute: 319 cells/uL (ref 15–500)
Eosinophils Relative: 5.9 %
HCT: 39.7 % (ref 35.0–45.0)
Hemoglobin: 13.6 g/dL (ref 11.7–15.5)
Lymphs Abs: 767 cells/uL — ABNORMAL LOW (ref 850–3900)
MCH: 30 pg (ref 27.0–33.0)
MCHC: 34.3 g/dL (ref 32.0–36.0)
MCV: 87.4 fL (ref 80.0–100.0)
MPV: 10.5 fL (ref 7.5–12.5)
Monocytes Relative: 7.4 %
Neutro Abs: 3877 cells/uL (ref 1500–7800)
Neutrophils Relative %: 71.8 %
Platelets: 252 10*3/uL (ref 140–400)
RBC: 4.54 10*6/uL (ref 3.80–5.10)
RDW: 12.4 % (ref 11.0–15.0)
Total Lymphocyte: 14.2 %
WBC: 5.4 10*3/uL (ref 3.8–10.8)

## 2020-09-27 LAB — CK: Total CK: 79 U/L (ref 29–143)

## 2020-09-27 NOTE — Progress Notes (Signed)
Absolute lymphocytes are low-767.  WBC count is WNL.  Please advise the patient to return in 1 month to recheck CBC.  Glucose is 105.  Rest of CMP WNL.  CK WNL.

## 2020-09-27 NOTE — Telephone Encounter (Signed)
-----   Message from Ofilia Neas, PA-C sent at 09/27/2020  8:08 AM EST ----- Absolute lymphocytes are low-767.  WBC count is WNL.  Please advise the patient to return in 1 month to recheck CBC.  Glucose is 105.  Rest of CMP WNL.  CK WNL.

## 2020-09-30 ENCOUNTER — Other Ambulatory Visit: Payer: Self-pay | Admitting: Internal Medicine

## 2020-09-30 DIAGNOSIS — Z1231 Encounter for screening mammogram for malignant neoplasm of breast: Secondary | ICD-10-CM

## 2020-10-01 DIAGNOSIS — M25511 Pain in right shoulder: Secondary | ICD-10-CM | POA: Diagnosis not present

## 2020-10-01 DIAGNOSIS — M3390 Dermatopolymyositis, unspecified, organ involvement unspecified: Secondary | ICD-10-CM | POA: Diagnosis not present

## 2020-10-01 DIAGNOSIS — Z789 Other specified health status: Secondary | ICD-10-CM | POA: Diagnosis not present

## 2020-10-01 DIAGNOSIS — D849 Immunodeficiency, unspecified: Secondary | ICD-10-CM | POA: Diagnosis not present

## 2020-10-01 DIAGNOSIS — Z299 Encounter for prophylactic measures, unspecified: Secondary | ICD-10-CM | POA: Diagnosis not present

## 2020-10-01 DIAGNOSIS — R0789 Other chest pain: Secondary | ICD-10-CM | POA: Diagnosis not present

## 2020-10-21 DIAGNOSIS — G5600 Carpal tunnel syndrome, unspecified upper limb: Secondary | ICD-10-CM | POA: Diagnosis not present

## 2020-10-21 DIAGNOSIS — R42 Dizziness and giddiness: Secondary | ICD-10-CM | POA: Diagnosis not present

## 2020-11-02 ENCOUNTER — Other Ambulatory Visit: Payer: Self-pay | Admitting: Physician Assistant

## 2020-11-02 DIAGNOSIS — M3390 Dermatopolymyositis, unspecified, organ involvement unspecified: Secondary | ICD-10-CM

## 2020-11-05 ENCOUNTER — Other Ambulatory Visit: Payer: Self-pay | Admitting: Physician Assistant

## 2020-11-05 DIAGNOSIS — I10 Essential (primary) hypertension: Secondary | ICD-10-CM | POA: Diagnosis not present

## 2020-11-05 DIAGNOSIS — J069 Acute upper respiratory infection, unspecified: Secondary | ICD-10-CM | POA: Diagnosis not present

## 2020-11-05 DIAGNOSIS — Z299 Encounter for prophylactic measures, unspecified: Secondary | ICD-10-CM | POA: Diagnosis not present

## 2020-11-05 DIAGNOSIS — M3390 Dermatopolymyositis, unspecified, organ involvement unspecified: Secondary | ICD-10-CM

## 2020-11-08 ENCOUNTER — Ambulatory Visit
Admission: RE | Admit: 2020-11-08 | Discharge: 2020-11-08 | Disposition: A | Discharge status: Home / Self Care | Payer: Medicare Other | Source: Ambulatory Visit | Attending: Internal Medicine | Admitting: Internal Medicine

## 2020-11-08 ENCOUNTER — Other Ambulatory Visit: Payer: Self-pay | Admitting: *Deleted

## 2020-11-08 ENCOUNTER — Other Ambulatory Visit: Payer: Self-pay

## 2020-11-08 DIAGNOSIS — Z79899 Other long term (current) drug therapy: Secondary | ICD-10-CM | POA: Diagnosis not present

## 2020-11-08 DIAGNOSIS — Z1231 Encounter for screening mammogram for malignant neoplasm of breast: Secondary | ICD-10-CM | POA: Diagnosis not present

## 2020-11-08 LAB — CBC WITH DIFFERENTIAL/PLATELET
Absolute Monocytes: 377 cells/uL (ref 200–950)
Basophils Absolute: 51 cells/uL (ref 0–200)
Basophils Relative: 1.1 %
Eosinophils Absolute: 290 cells/uL (ref 15–500)
Eosinophils Relative: 6.3 %
HCT: 35.4 % (ref 35.0–45.0)
Hemoglobin: 12.1 g/dL (ref 11.7–15.5)
Lymphs Abs: 1173 cells/uL (ref 850–3900)
MCH: 30.1 pg (ref 27.0–33.0)
MCHC: 34.2 g/dL (ref 32.0–36.0)
MCV: 88.1 fL (ref 80.0–100.0)
MPV: 10.7 fL (ref 7.5–12.5)
Monocytes Relative: 8.2 %
Neutro Abs: 2709 cells/uL (ref 1500–7800)
Neutrophils Relative %: 58.9 %
Platelets: 250 10*3/uL (ref 140–400)
RBC: 4.02 10*6/uL (ref 3.80–5.10)
RDW: 12.2 % (ref 11.0–15.0)
Total Lymphocyte: 25.5 %
WBC: 4.6 10*3/uL (ref 3.8–10.8)

## 2020-11-11 NOTE — Progress Notes (Signed)
CBC is normal.

## 2020-11-16 ENCOUNTER — Other Ambulatory Visit: Payer: Self-pay | Admitting: Physician Assistant

## 2020-11-16 DIAGNOSIS — M3390 Dermatopolymyositis, unspecified, organ involvement unspecified: Secondary | ICD-10-CM

## 2020-11-18 NOTE — Telephone Encounter (Signed)
Last Visit: 09/26/2020 Next Visit: 02/25/2021 Labs: 11/08/2020,CBC is normal. CMP Absolute lymphocytes are low-767. WBC count is WNL. Please advise the patient to return in 1 month to recheck CBC. Glucose is 105. Rest of CMP WNL. CK WNL  Current Dose per office note 09/26/2020, Imuran 100 mg by mouth daily DX:  Dermatomyositis   Last Fill: 08/26/2020  Okay to refill Imuran?

## 2021-01-12 ENCOUNTER — Other Ambulatory Visit: Payer: Self-pay | Admitting: Physician Assistant

## 2021-01-12 DIAGNOSIS — M3390 Dermatopolymyositis, unspecified, organ involvement unspecified: Secondary | ICD-10-CM

## 2021-01-13 NOTE — Telephone Encounter (Signed)
Next Visit: 02/25/2021  Last Visit:  09/26/2020  Last Fill: 11/18/2020  DX: Dermatomyositis  Current Dose per office note 09/26/2020, Imuran 100 mg by mouth daily  Labs: 11/08/2020, CBC is normal. CMP 09/26/2020, Absolute lymphocytes are low-767. WBC count is WNL. Glucose is 105. Rest of CMP WNL. CK WNL.   Okay to refill Imuran?

## 2021-02-11 NOTE — Progress Notes (Deleted)
Office Visit Note  Patient: Carmen Woods             Date of Birth: [birth year]           MRN: [id number]             PCP: Glenda Chroman, MD Referring: Glenda Chroman, MD Visit Date: 02/25/2021 Occupation: @GUAROCC @  Subjective:  No chief complaint on file.   History of Present Illness: Carmen Woods is a 65 y.o. female ***   Activities of Daily Living:  Patient reports morning stiffness for *** {minute/hour:19697}.   Patient {ACTIONS;DENIES/REPORTS:21021675::"Denies"} nocturnal pain.  Difficulty dressing/grooming: {ACTIONS;DENIES/REPORTS:21021675::"Denies"} Difficulty climbing stairs: {ACTIONS;DENIES/REPORTS:21021675::"Denies"} Difficulty getting out of chair: {ACTIONS;DENIES/REPORTS:21021675::"Denies"} Difficulty using hands for taps, buttons, cutlery, and/or writing: {ACTIONS;DENIES/REPORTS:21021675::"Denies"}  No Rheumatology ROS completed.   PMFS History:  Patient Active Problem List   Diagnosis Date Noted  . Basal cell carcinoma (BCC) 08/25/2017  . Primary osteoarthritis of both hands 03/18/2017  . Primary osteoarthritis of both feet 03/18/2017  . High risk medication use 11/10/2016  . Galtron's Papules 11/10/2016  . Other fatigue 11/10/2016  . Spondylosis of lumbar region without myelopathy or radiculopathy 11/10/2016  . History of anxiety 11/10/2016  . Vitamin D deficiency 11/10/2016  . History of vertigo 11/10/2016  . Sjogren's syndrome with keratoconjunctivitis sicca ([place]) 11/10/2016  . History of hypothyroidism 11/10/2016  . Dermatomyositis ([place]) 01/07/2015  . Mitral regurgitation 01/19/2013  . Precordial pain 01/19/2013    Past Medical History:  Diagnosis Date  . DDD (degenerative disc disease), lumbar   . Dermatomyositis ([place]) 12/2014  . Difficulty swallowing   . Dry eyes   . Dry mouth   . Hypothyroidism   . Mass of left thigh 12/2014  . Rash    hands, left thigh    Family History  Problem Relation Age of Onset  . Heart Problems  Mother        Pacermaker  . Breast cancer Mother   . Diabetes Father   . Osteoarthritis Sister   . Kidney disease Sister 90  . Rheum arthritis Sister   . Thyroid disease Sister    Past Surgical History:  Procedure Laterality Date  . BACK SURGERY  1998  . BELPHAROPTOSIS REPAIR Bilateral 11/2014  . MUSCLE BIOPSY Left 01/07/2015   Procedure: LEFT THIGH MUSCLE BIOPSY;  Surgeon: Fanny Skates, MD;  Location: [facility];  Service: General;  Laterality: Left;  . skin cancer removal Right 2020   hand  . skin cancer removal   07/2017   Social History   Social History Narrative  . Not on file   Immunization History  Administered Date(s) Administered  . Influenza,inj,quad, With Preservative 07/12/2017     Objective: Vital Signs: There were no vitals taken for this visit.   Physical Exam   Musculoskeletal Exam: ***  CDAI Exam: CDAI Score: -- Patient Global: --; Provider Global: -- Swollen: --; Tender: -- Joint Exam 02/25/2021   No joint exam has been documented for this visit   There is currently no information documented on the homunculus. Go to the Rheumatology activity and complete the homunculus joint exam.  Investigation: No additional findings.  Imaging: No results found.  Recent Labs: Lab Results  Component Value Date   WBC 4.6 11/08/2020   HGB 12.1 11/08/2020   PLT 250 11/08/2020   NA 141 09/26/2020   K 4.6 09/26/2020   CL 105 09/26/2020   CO2 31 09/26/2020   GLUCOSE 105 (H) 09/26/2020  BUN 8 09/26/2020   CREATININE 0.70 09/26/2020   BILITOT 0.6 09/26/2020   ALKPHOS 99 03/22/2017   AST 26 09/26/2020   ALT 13 09/26/2020   PROT 6.9 09/26/2020   ALBUMIN 4.3 03/22/2017   CALCIUM 10.2 09/26/2020   GFRAA 106 09/26/2020    Speciality Comments: No specialty comments available.  Procedures:  No procedures performed Allergies: Codeine and Hydrocodone   Assessment / Plan:     Visit Diagnoses: Dermatomyositis ([place])  Sjogren's syndrome  with keratoconjunctivitis sicca (HCC)  High risk medication use  Primary osteoarthritis of both hands  Lateral epicondylitis, left elbow  Primary osteoarthritis of both feet  Spondylosis of lumbar region without myelopathy or radiculopathy  Other fatigue  History of hypothyroidism  Basal cell carcinoma (BCC), unspecified site  History of anxiety  Vitamin D deficiency  Orders: No orders of the defined types were placed in this encounter.  No orders of the defined types were placed in this encounter.   Face-to-face time spent with patient was *** minutes. Greater than 50% of time was spent in counseling and coordination of care.  Follow-Up Instructions: No follow-ups on file.   Ofilia Neas, PA-C  Note - This record has been created using Dragon software.  Chart creation errors have been sought, but may not always  have been located. Such creation errors do not reflect on  the standard of medical care.

## 2021-02-24 DIAGNOSIS — I10 Essential (primary) hypertension: Secondary | ICD-10-CM | POA: Diagnosis not present

## 2021-02-24 DIAGNOSIS — Z299 Encounter for prophylactic measures, unspecified: Secondary | ICD-10-CM | POA: Diagnosis not present

## 2021-02-24 DIAGNOSIS — D849 Immunodeficiency, unspecified: Secondary | ICD-10-CM | POA: Diagnosis not present

## 2021-02-25 ENCOUNTER — Ambulatory Visit: Payer: Medicare Other | Admitting: Rheumatology

## 2021-02-25 DIAGNOSIS — M3501 Sicca syndrome with keratoconjunctivitis: Secondary | ICD-10-CM

## 2021-02-25 DIAGNOSIS — M19071 Primary osteoarthritis, right ankle and foot: Secondary | ICD-10-CM

## 2021-02-25 DIAGNOSIS — M19041 Primary osteoarthritis, right hand: Secondary | ICD-10-CM

## 2021-02-25 DIAGNOSIS — Z8659 Personal history of other mental and behavioral disorders: Secondary | ICD-10-CM

## 2021-02-25 DIAGNOSIS — C4491 Basal cell carcinoma of skin, unspecified: Secondary | ICD-10-CM

## 2021-02-25 DIAGNOSIS — Z8639 Personal history of other endocrine, nutritional and metabolic disease: Secondary | ICD-10-CM

## 2021-02-25 DIAGNOSIS — M3390 Dermatopolymyositis, unspecified, organ involvement unspecified: Secondary | ICD-10-CM

## 2021-02-25 DIAGNOSIS — E559 Vitamin D deficiency, unspecified: Secondary | ICD-10-CM

## 2021-02-25 DIAGNOSIS — Z79899 Other long term (current) drug therapy: Secondary | ICD-10-CM

## 2021-02-25 DIAGNOSIS — M7712 Lateral epicondylitis, left elbow: Secondary | ICD-10-CM

## 2021-02-25 DIAGNOSIS — R5383 Other fatigue: Secondary | ICD-10-CM

## 2021-02-25 DIAGNOSIS — M47816 Spondylosis without myelopathy or radiculopathy, lumbar region: Secondary | ICD-10-CM

## 2021-03-04 NOTE — Progress Notes (Signed)
Office Visit Note  Patient: Carmen Woods             Date of Birth: [birth year]           MRN: [id number]             PCP: Glenda Chroman, MD Referring: Glenda Chroman, MD Visit Date: 03/18/2021 Occupation: @GUAROCC @  Subjective:  Dry mouth and dry eyes.  History of present illness: Carmen Woods is 65 years old female with history of Sjogren's and dermatomyositis.  She denies any increased muscle weakness or tenderness.  She denies any history of recent rash.  She continues to have dry mouth and dry eyes.  She states she recently restarted Restasis eyedrops but continues to have dry eye symptoms.  She has been using over-the-counter products for dry mouth.  She denies any increased joint pain or joint stiffness.  She continues to have some stiffness in her hands and difficulty climbing stairs due to knee joint discomfort.  Activities of Daily Living:  Patient reports morning stiffness for 1.5 hours.   Patient Denies nocturnal pain.  Difficulty dressing/grooming: Denies Difficulty climbing stairs: Reports Difficulty getting out of chair: Denies Difficulty using hands for taps, buttons, cutlery, and/or writing: Reports  Review of Systems  Constitutional:  Positive for fatigue.  HENT:  Positive for mouth dryness. Negative for mouth sores and nose dryness.   Eyes:  Positive for dryness. Negative for pain and itching.  Respiratory:  Negative for shortness of breath and difficulty breathing.   Cardiovascular:  Positive for chest pain. Negative for palpitations.       Patient reports she has had cardiology work up.   Gastrointestinal:  Negative for blood in stool, constipation and diarrhea.  Endocrine: Negative for increased urination.  Genitourinary:  Negative for difficulty urinating.  Musculoskeletal:  Positive for joint pain, joint pain, myalgias, morning stiffness, muscle tenderness and myalgias. Negative for joint swelling.  Skin:  Negative for color change, rash and  redness.  Allergic/Immunologic: Negative for susceptible to infections.  Neurological:  Negative for dizziness, numbness, headaches and weakness.  Hematological:  Negative for bruising/bleeding tendency.  Psychiatric/Behavioral:  Positive for sleep disturbance. Negative for confusion.    PMFS History:  Patient Active Problem List   Diagnosis Date Noted   Basal cell carcinoma (BCC) 08/25/2017   Primary osteoarthritis of both hands 03/18/2017   Primary osteoarthritis of both feet 03/18/2017   High risk medication use 11/10/2016   Galtron's Papules 11/10/2016   Other fatigue 11/10/2016   Spondylosis of lumbar region without myelopathy or radiculopathy 11/10/2016   History of anxiety 11/10/2016   Vitamin D deficiency 11/10/2016   History of vertigo 11/10/2016   Sjogren's syndrome with keratoconjunctivitis sicca ([place]) 11/10/2016   History of hypothyroidism 11/10/2016   Dermatomyositis ([place]) 01/07/2015   Mitral regurgitation 01/19/2013   Precordial pain 01/19/2013    Past Medical History:  Diagnosis Date   Basal cell carcinoma    DDD (degenerative disc disease), lumbar    Dermatomyositis ([facility]) 12/21/2014   Difficulty swallowing    Dry eyes    Dry mouth    Hypothyroidism    Mass of left thigh 12/21/2014   Rash    hands, left thigh    Family History  Problem Relation Age of Onset   Heart Problems Mother        Pacermaker   Breast cancer Mother    Diabetes Father    Osteoarthritis Sister    Kidney disease Sister 6  Rheum arthritis Sister    Thyroid disease Sister    Past Surgical History:  Procedure Laterality Date   [place] Bilateral 11/2014   MUSCLE BIOPSY Left 01/07/2015   Procedure: LEFT THIGH MUSCLE BIOPSY;  Surgeon: Fanny Skates, MD;  Location: [facility];  Service: General;  Laterality: Left;   skin cancer removal Right 2020   hand   skin cancer removal   07/2017   Social History   Social History Narrative    Not on file   Immunization History  Administered Date(s) Administered   Influenza,inj,quad, With Preservative 07/12/2017     Objective: Vital Signs: BP (!) 150/77 (BP Location: Left Arm, Patient Position: Sitting, Cuff Size: Normal)   Pulse 60   Resp 15   Ht 5\' 7"  (1.702 m)   Wt 182 lb 3.2 oz (82.6 kg)   BMI 28.54 kg/m    Physical Exam Vitals and nursing note reviewed.  Constitutional:      Appearance: She is well-developed.  HENT:     Head: Normocephalic and atraumatic.  Eyes:     Conjunctiva/sclera: Conjunctivae normal.  Cardiovascular:     Rate and Rhythm: Normal rate and regular rhythm.     Heart sounds: Normal heart sounds.  Pulmonary:     Effort: Pulmonary effort is normal.     Breath sounds: Normal breath sounds.  Abdominal:     General: Bowel sounds are normal.     Palpations: Abdomen is soft.  Musculoskeletal:     Cervical back: Normal range of motion.  Lymphadenopathy:     Cervical: No cervical adenopathy.  Skin:    General: Skin is warm and dry.     Capillary Refill: Capillary refill takes less than 2 seconds.  Neurological:     Mental Status: She is alert and oriented to person, place, and time.  Psychiatric:        Behavior: Behavior normal.     Musculoskeletal Exam: C-spine was in good range of motion.  Shoulder joints, elbow joints, wrist joints with good range of motion.  She had bilateral PIP DIP and CMC thickening with no synovitis.  Hip joints and knee joints with good range of motion.  She had no tenderness over ankles or MTPs.  CDAI Exam: CDAI Score: -- Patient Global: --; Provider Global: -- Swollen: --; Tender: -- Joint Exam 03/18/2021   No joint exam has been documented for this visit   There is currently no information documented on the homunculus. Go to the Rheumatology activity and complete the homunculus joint exam.  Investigation: No additional findings.  Imaging: No results found.  Recent Labs: Lab Results  Component  Value Date   WBC 4.6 11/08/2020   HGB 12.1 11/08/2020   PLT 250 11/08/2020   NA 141 09/26/2020   K 4.6 09/26/2020   CL 105 09/26/2020   CO2 31 09/26/2020   GLUCOSE 105 (H) 09/26/2020   BUN 8 09/26/2020   CREATININE 0.70 09/26/2020   BILITOT 0.6 09/26/2020   ALKPHOS 99 03/22/2017   AST 26 09/26/2020   ALT 13 09/26/2020   PROT 6.9 09/26/2020   ALBUMIN 4.3 03/22/2017   CALCIUM 10.2 09/26/2020   GFRAA 106 09/26/2020    Speciality Comments: No specialty comments available.  Procedures:  No procedures performed Allergies: Codeine and Hydrocodone   Assessment / Plan:     Visit Diagnoses: Dermatomyositis ([place]) - Positive biopsy May 2016, Ro 52 positive, elevated CK and aldolase, Gottron's  papules, muscle weakness, fatigue, positive Ro, positive anti-chromatin: -She has not had any flare of dermatomyositis.  She denies any history of increased muscle pain or muscle weakness.  She denies any rash.  Her last labs were in January.  Need for getting labs on a regular basis was emphasized.  Plan: CK  High risk medication use - Imuran 100 mg by mouth daily. - Plan: CBC with Differential/Platelet, COMPLETE METABOLIC PANEL WITH GFR today and then every 3 months to monitor for drug toxicity.  She plans on getting COVID-19 vaccination.  Recommendations regarding other vaccines were also placed in the AVS.  Sjogren's syndrome with keratoconjunctivitis sicca (HCC)-over-the-counter products were discussed.  Primary osteoarthritis of both hands-joint protection muscle strengthening was discussed.  Primary osteoarthritis of both feet-she has off-and-on discomfort.  Spondylosis of lumbar region without myelopathy or radiculopathy-her lower back pain is manageable currently.  Other fatigue-she continues to have some fatigue.  She states that she had not B12 because of fatigue.  We will check her CK today.  History of anxiety  History of hypothyroidism  Basal cell carcinoma (BCC) of overlapping  sites of skin - She is followed closely by dermatology.  She has some new lesions on her right lower extremity for which she has been seen back dermatologist.  Vitamin D deficiency-she has been on the supplement and her last vitamin D level was within normal limits.  Orders: Orders Placed This Encounter  Procedures   CBC with Differential/Platelet   COMPLETE METABOLIC PANEL WITH GFR   CK    No orders of the defined types were placed in this encounter.    Follow-Up Instructions: Return in about 5 months (around 08/18/2021) for DM, Sjogren's.   Bo Merino, MD  Note - This record has been created using Editor, commissioning.  Chart creation errors have been sought, but may not always  have been located. Such creation errors do not reflect on  the standard of medical care.

## 2021-03-12 DIAGNOSIS — C44712 Basal cell carcinoma of skin of right lower limb, including hip: Secondary | ICD-10-CM | POA: Diagnosis not present

## 2021-03-12 DIAGNOSIS — M3310 Other dermatopolymyositis, organ involvement unspecified: Secondary | ICD-10-CM | POA: Diagnosis not present

## 2021-03-12 DIAGNOSIS — Z85828 Personal history of other malignant neoplasm of skin: Secondary | ICD-10-CM | POA: Diagnosis not present

## 2021-03-12 DIAGNOSIS — D485 Neoplasm of uncertain behavior of skin: Secondary | ICD-10-CM | POA: Diagnosis not present

## 2021-03-12 DIAGNOSIS — L905 Scar conditions and fibrosis of skin: Secondary | ICD-10-CM | POA: Diagnosis not present

## 2021-03-12 DIAGNOSIS — L821 Other seborrheic keratosis: Secondary | ICD-10-CM | POA: Diagnosis not present

## 2021-03-18 ENCOUNTER — Other Ambulatory Visit: Payer: Self-pay

## 2021-03-18 ENCOUNTER — Encounter: Payer: Self-pay | Admitting: Rheumatology

## 2021-03-18 ENCOUNTER — Ambulatory Visit (INDEPENDENT_AMBULATORY_CARE_PROVIDER_SITE_OTHER): Payer: Medicare Other | Admitting: Rheumatology

## 2021-03-18 VITALS — BP 150/77 | HR 60 | Resp 15 | Ht 67.0 in | Wt 182.2 lb

## 2021-03-18 DIAGNOSIS — M19041 Primary osteoarthritis, right hand: Secondary | ICD-10-CM

## 2021-03-18 DIAGNOSIS — E559 Vitamin D deficiency, unspecified: Secondary | ICD-10-CM

## 2021-03-18 DIAGNOSIS — M47816 Spondylosis without myelopathy or radiculopathy, lumbar region: Secondary | ICD-10-CM

## 2021-03-18 DIAGNOSIS — M3501 Sicca syndrome with keratoconjunctivitis: Secondary | ICD-10-CM | POA: Diagnosis not present

## 2021-03-18 DIAGNOSIS — M3390 Dermatopolymyositis, unspecified, organ involvement unspecified: Secondary | ICD-10-CM

## 2021-03-18 DIAGNOSIS — M19042 Primary osteoarthritis, left hand: Secondary | ICD-10-CM | POA: Diagnosis not present

## 2021-03-18 DIAGNOSIS — Z8639 Personal history of other endocrine, nutritional and metabolic disease: Secondary | ICD-10-CM | POA: Diagnosis not present

## 2021-03-18 DIAGNOSIS — C4481 Basal cell carcinoma of overlapping sites of skin: Secondary | ICD-10-CM

## 2021-03-18 DIAGNOSIS — M19071 Primary osteoarthritis, right ankle and foot: Secondary | ICD-10-CM

## 2021-03-18 DIAGNOSIS — Z79899 Other long term (current) drug therapy: Secondary | ICD-10-CM

## 2021-03-18 DIAGNOSIS — R5383 Other fatigue: Secondary | ICD-10-CM

## 2021-03-18 DIAGNOSIS — Z8659 Personal history of other mental and behavioral disorders: Secondary | ICD-10-CM

## 2021-03-18 DIAGNOSIS — M19072 Primary osteoarthritis, left ankle and foot: Secondary | ICD-10-CM

## 2021-03-18 NOTE — Patient Instructions (Addendum)
Standing Labs We placed an order today for your standing lab work.   Please have your standing labs drawn in October and every 3 months  If possible, please have your labs drawn 2 weeks prior to your appointment so that the provider can discuss your results at your appointment.  Please note that you may see your imaging and lab results in [place] before we have reviewed them. We may be awaiting multiple results to interpret others before contacting you. Please allow our office up to 72 hours to thoroughly review all of the results before contacting the office for clarification of your results.  We have open lab daily: Monday through Thursday from 1:30-4:30 PM and Friday from 1:30-4:00 PM at the office of Dr. Bo Merino, [facility].   Please be advised, all patients with office appointments requiring lab work will take precedent over walk-in lab work.  If possible, please come for your lab work on Monday and Friday afternoons, as you may experience shorter wait times. The office is located at [address], [place], [place], [place] [address] No appointment is necessary.   Labs are drawn by [facility]. Please bring your co-pay at the time of your lab draw.  You may receive a bill from [facility] for your lab work.  If you wish to have your labs drawn at another location, please call the office 24 hours in advance to send orders.  If you have any questions regarding directions or hours of operation,  please call [phone].   As a reminder, please drink plenty of water prior to coming for your lab work. Thanks!   Vaccines You are taking a medication(s) that can suppress your immune system.  The following immunizations are recommended: Flu annually Covid-19  Td/Tdap (tetanus, diphtheria, pertussis) every 10 years Pneumonia (Prevnar 15 then Pneumovax 23 at least 1 year apart.  Alternatively, can take Prevnar 20 without needing additional dose) Shingrix (after age 42): 2  doses from 4 weeks to 6 months apart  Please check with your PCP to make sure you are up to date.

## 2021-03-19 DIAGNOSIS — C44712 Basal cell carcinoma of skin of right lower limb, including hip: Secondary | ICD-10-CM | POA: Diagnosis not present

## 2021-03-19 LAB — CBC WITH DIFFERENTIAL/PLATELET
Absolute Monocytes: 623 cells/uL (ref 200–950)
Basophils Absolute: 42 cells/uL (ref 0–200)
Basophils Relative: 0.6 %
Eosinophils Absolute: 182 cells/uL (ref 15–500)
Eosinophils Relative: 2.6 %
HCT: 38.7 % (ref 35.0–45.0)
Hemoglobin: 13.1 g/dL (ref 11.7–15.5)
Lymphs Abs: 1134 cells/uL (ref 850–3900)
MCH: 29.6 pg (ref 27.0–33.0)
MCHC: 33.9 g/dL (ref 32.0–36.0)
MCV: 87.6 fL (ref 80.0–100.0)
MPV: 10.3 fL (ref 7.5–12.5)
Monocytes Relative: 8.9 %
Neutro Abs: 5019 cells/uL (ref 1500–7800)
Neutrophils Relative %: 71.7 %
Platelets: 220 10*3/uL (ref 140–400)
RBC: 4.42 10*6/uL (ref 3.80–5.10)
RDW: 12.7 % (ref 11.0–15.0)
Total Lymphocyte: 16.2 %
WBC: 7 10*3/uL (ref 3.8–10.8)

## 2021-03-19 LAB — COMPLETE METABOLIC PANEL WITH GFR
AG Ratio: 1.7 (calc) (ref 1.0–2.5)
ALT: 10 U/L (ref 6–29)
AST: 19 U/L (ref 10–35)
Albumin: 4.3 g/dL (ref 3.6–5.1)
Alkaline phosphatase (APISO): 80 U/L (ref 37–153)
BUN/Creatinine Ratio: 9 (calc) (ref 6–22)
BUN: 6 mg/dL — ABNORMAL LOW (ref 7–25)
CO2: 30 mmol/L (ref 20–32)
Calcium: 9.7 mg/dL (ref 8.6–10.4)
Chloride: 105 mmol/L (ref 98–110)
Creat: 0.67 mg/dL (ref 0.50–0.99)
GFR, Est African American: 108 mL/min/{1.73_m2} (ref >=60)
GFR, Est Non African American: 93 mL/min/{1.73_m2} (ref >=60)
Globulin: 2.5 g/dL (calc) (ref 1.9–3.7)
Glucose, Bld: 89 mg/dL (ref 65–99)
Potassium: 4 mmol/L (ref 3.5–5.3)
Sodium: 142 mmol/L (ref 135–146)
Total Bilirubin: 0.4 mg/dL (ref 0.2–1.2)
Total Protein: 6.8 g/dL (ref 6.1–8.1)

## 2021-03-19 LAB — CK: Total CK: 89 U/L (ref 29–143)

## 2021-03-19 NOTE — Progress Notes (Signed)
CBC, CMP and CK are normal.

## 2021-03-21 DIAGNOSIS — M436 Torticollis: Secondary | ICD-10-CM | POA: Diagnosis not present

## 2021-03-21 DIAGNOSIS — D849 Immunodeficiency, unspecified: Secondary | ICD-10-CM | POA: Diagnosis not present

## 2021-03-21 DIAGNOSIS — Z299 Encounter for prophylactic measures, unspecified: Secondary | ICD-10-CM | POA: Diagnosis not present

## 2021-03-21 DIAGNOSIS — R350 Frequency of micturition: Secondary | ICD-10-CM | POA: Diagnosis not present

## 2021-03-21 DIAGNOSIS — I10 Essential (primary) hypertension: Secondary | ICD-10-CM | POA: Diagnosis not present

## 2021-04-07 ENCOUNTER — Other Ambulatory Visit: Payer: Self-pay | Admitting: Physician Assistant

## 2021-04-07 DIAGNOSIS — M3390 Dermatopolymyositis, unspecified, organ involvement unspecified: Secondary | ICD-10-CM

## 2021-04-08 NOTE — Telephone Encounter (Signed)
Next Visit: 08/20/2021  Last Visit: 03/18/2021  Last Fill: 01/13/2021  DX: Dermatomyositis  Current Dose per office note 03/18/2021: Imuran 100 mg by mouth daily  Labs: 03/18/2021 CBC, CMP and CK are normal.  Per protocol, okay to refill per Dr. Estanislado Pandy

## 2021-04-09 DIAGNOSIS — C44712 Basal cell carcinoma of skin of right lower limb, including hip: Secondary | ICD-10-CM | POA: Diagnosis not present

## 2021-04-09 DIAGNOSIS — L818 Other specified disorders of pigmentation: Secondary | ICD-10-CM | POA: Diagnosis not present

## 2021-04-09 DIAGNOSIS — Z85828 Personal history of other malignant neoplasm of skin: Secondary | ICD-10-CM | POA: Diagnosis not present

## 2021-05-06 DIAGNOSIS — Z0000 Encounter for general adult medical examination without abnormal findings: Secondary | ICD-10-CM | POA: Diagnosis not present

## 2021-05-06 DIAGNOSIS — I10 Essential (primary) hypertension: Secondary | ICD-10-CM | POA: Diagnosis not present

## 2021-05-06 DIAGNOSIS — E7800 Pure hypercholesterolemia, unspecified: Secondary | ICD-10-CM | POA: Diagnosis not present

## 2021-05-06 DIAGNOSIS — Z79899 Other long term (current) drug therapy: Secondary | ICD-10-CM | POA: Diagnosis not present

## 2021-05-06 DIAGNOSIS — Z7189 Other specified counseling: Secondary | ICD-10-CM | POA: Diagnosis not present

## 2021-05-06 DIAGNOSIS — E039 Hypothyroidism, unspecified: Secondary | ICD-10-CM | POA: Diagnosis not present

## 2021-05-06 DIAGNOSIS — R5383 Other fatigue: Secondary | ICD-10-CM | POA: Diagnosis not present

## 2021-05-06 DIAGNOSIS — Z299 Encounter for prophylactic measures, unspecified: Secondary | ICD-10-CM | POA: Diagnosis not present

## 2021-06-30 DIAGNOSIS — I10 Essential (primary) hypertension: Secondary | ICD-10-CM | POA: Diagnosis not present

## 2021-06-30 DIAGNOSIS — H6981 Other specified disorders of Eustachian tube, right ear: Secondary | ICD-10-CM | POA: Diagnosis not present

## 2021-06-30 DIAGNOSIS — R599 Enlarged lymph nodes, unspecified: Secondary | ICD-10-CM | POA: Diagnosis not present

## 2021-06-30 DIAGNOSIS — Z299 Encounter for prophylactic measures, unspecified: Secondary | ICD-10-CM | POA: Diagnosis not present

## 2021-06-30 DIAGNOSIS — Z23 Encounter for immunization: Secondary | ICD-10-CM | POA: Diagnosis not present

## 2021-06-30 DIAGNOSIS — J029 Acute pharyngitis, unspecified: Secondary | ICD-10-CM | POA: Diagnosis not present

## 2021-07-01 ENCOUNTER — Other Ambulatory Visit: Payer: Self-pay | Admitting: Rheumatology

## 2021-07-01 DIAGNOSIS — M3390 Dermatopolymyositis, unspecified, organ involvement unspecified: Secondary | ICD-10-CM

## 2021-07-01 DIAGNOSIS — Z79899 Other long term (current) drug therapy: Secondary | ICD-10-CM

## 2021-07-02 NOTE — Telephone Encounter (Signed)
Next Visit: 08/20/2021   Last Visit: 03/18/2021   Last Fill: 01/13/2021   DX: Dermatomyositis   Current Dose per office note 03/18/2021: Imuran 100 mg by mouth daily   Labs: 03/18/2021 CBC, CMP and CK are normal.   Patient advised she is due to update labs. Patient states she will update Friday.   Okay to refill Imuran?

## 2021-07-04 ENCOUNTER — Other Ambulatory Visit: Payer: Self-pay | Admitting: *Deleted

## 2021-07-04 DIAGNOSIS — Z79899 Other long term (current) drug therapy: Secondary | ICD-10-CM | POA: Diagnosis not present

## 2021-07-05 LAB — COMPLETE METABOLIC PANEL WITH GFR
AG Ratio: 1.7 (calc) (ref 1.0–2.5)
ALT: 10 U/L (ref 6–29)
AST: 20 U/L (ref 10–35)
Albumin: 4.2 g/dL (ref 3.6–5.1)
Alkaline phosphatase (APISO): 85 U/L (ref 37–153)
BUN: 7 mg/dL (ref 7–25)
CO2: 30 mmol/L (ref 20–32)
Calcium: 9.8 mg/dL (ref 8.6–10.4)
Chloride: 105 mmol/L (ref 98–110)
Creat: 0.62 mg/dL (ref 0.50–1.05)
Globulin: 2.5 g/dL (calc) (ref 1.9–3.7)
Glucose, Bld: 97 mg/dL (ref 65–99)
Potassium: 4.2 mmol/L (ref 3.5–5.3)
Sodium: 142 mmol/L (ref 135–146)
Total Bilirubin: 0.5 mg/dL (ref 0.2–1.2)
Total Protein: 6.7 g/dL (ref 6.1–8.1)
eGFR: 99 mL/min/{1.73_m2} (ref >=60)

## 2021-07-05 LAB — CBC WITH DIFFERENTIAL/PLATELET
Absolute Monocytes: 410 cells/uL (ref 200–950)
Basophils Absolute: 50 cells/uL (ref 0–200)
Basophils Relative: 1.1 %
Eosinophils Absolute: 261 cells/uL (ref 15–500)
Eosinophils Relative: 5.8 %
HCT: 38.5 % (ref 35.0–45.0)
Hemoglobin: 12.9 g/dL (ref 11.7–15.5)
Lymphs Abs: 954 cells/uL (ref 850–3900)
MCH: 29.9 pg (ref 27.0–33.0)
MCHC: 33.5 g/dL (ref 32.0–36.0)
MCV: 89.3 fL (ref 80.0–100.0)
MPV: 10.3 fL (ref 7.5–12.5)
Monocytes Relative: 9.1 %
Neutro Abs: 2826 cells/uL (ref 1500–7800)
Neutrophils Relative %: 62.8 %
Platelets: 225 10*3/uL (ref 140–400)
RBC: 4.31 10*6/uL (ref 3.80–5.10)
RDW: 12.2 % (ref 11.0–15.0)
Total Lymphocyte: 21.2 %
WBC: 4.5 10*3/uL (ref 3.8–10.8)

## 2021-07-07 NOTE — Progress Notes (Signed)
CBC and CMP WNL

## 2021-08-06 NOTE — Progress Notes (Deleted)
Office Visit Note  Patient: Carmen Woods             Date of Birth: 1955/12/30           MRN: [id number]             PCP: Glenda Chroman, MD Referring: Glenda Chroman, MD Visit Date: 08/20/2021 Occupation: @GUAROCC @  Subjective:  No chief complaint on file.   History of Present Illness: Carmen Woods is a 65 y.o. female ***   Activities of Daily Living:  Patient reports morning stiffness for *** {minute/hour:19697}.   Patient {ACTIONS;DENIES/REPORTS:21021675::"Denies"} nocturnal pain.  Difficulty dressing/grooming: {ACTIONS;DENIES/REPORTS:21021675::"Denies"} Difficulty climbing stairs: {ACTIONS;DENIES/REPORTS:21021675::"Denies"} Difficulty getting out of chair: {ACTIONS;DENIES/REPORTS:21021675::"Denies"} Difficulty using hands for taps, buttons, cutlery, and/or writing: {ACTIONS;DENIES/REPORTS:21021675::"Denies"}  No Rheumatology ROS completed.   PMFS History:  Patient Active Problem List   Diagnosis Date Noted   Basal cell carcinoma (BCC) 08/25/2017   Primary osteoarthritis of both hands 03/18/2017   Primary osteoarthritis of both feet 03/18/2017   High risk medication use 11/10/2016   Galtron's Papules 11/10/2016   Other fatigue 11/10/2016   Spondylosis of lumbar region without myelopathy or radiculopathy 11/10/2016   History of anxiety 11/10/2016   Vitamin D deficiency 11/10/2016   History of vertigo 11/10/2016   Sjogren's syndrome with keratoconjunctivitis sicca ([place]) 11/10/2016   History of hypothyroidism 11/10/2016   Dermatomyositis ([place]) 01/07/2015   Mitral regurgitation 01/19/2013   Precordial pain 01/19/2013    Past Medical History:  Diagnosis Date   Basal cell carcinoma    DDD (degenerative disc disease), lumbar    Dermatomyositis ([place]) 12/21/2014   Difficulty swallowing    Dry eyes    Dry mouth    Hypothyroidism    Mass of left thigh 12/21/2014   Rash    hands, left thigh    Family History  Problem Relation Age of Onset   Heart  Problems Mother        Pacermaker   Breast cancer Mother    Diabetes Father    Osteoarthritis Sister    Kidney disease Sister 69   Rheum arthritis Sister    Thyroid disease Sister    Past Surgical History:  Procedure Laterality Date   [place] Bilateral 11/2014   MUSCLE BIOPSY Left 01/07/2015   Procedure: LEFT THIGH MUSCLE BIOPSY;  Surgeon: Fanny Skates, MD;  Location: [facility];  Service: General;  Laterality: Left;   skin cancer removal Right 2020   hand   skin cancer removal   07/2017   Social History   Social History Narrative   Not on file   Immunization History  Administered Date(s) Administered   Influenza,inj,quad, With Preservative 07/12/2017     Objective: Vital Signs: There were no vitals taken for this visit.   Physical Exam   Musculoskeletal Exam: ***  CDAI Exam: CDAI Score: -- Patient Global: --; Provider Global: -- Swollen: --; Tender: -- Joint Exam 08/20/2021   No joint exam has been documented for this visit   There is currently no information documented on the homunculus. Go to the Rheumatology activity and complete the homunculus joint exam.  Investigation: No additional findings.  Imaging: No results found.  Recent Labs: Lab Results  Component Value Date   WBC 4.5 07/04/2021   HGB 12.9 07/04/2021   PLT 225 07/04/2021   NA 142 07/04/2021   K 4.2 07/04/2021   CL 105 07/04/2021   CO2 30 07/04/2021  GLUCOSE 97 07/04/2021   BUN 7 07/04/2021   CREATININE 0.62 07/04/2021   BILITOT 0.5 07/04/2021   ALKPHOS 99 03/22/2017   AST 20 07/04/2021   ALT 10 07/04/2021   PROT 6.7 07/04/2021   ALBUMIN 4.3 03/22/2017   CALCIUM 9.8 07/04/2021   GFRAA 108 03/18/2021    Speciality Comments: No specialty comments available.  Procedures:  No procedures performed Allergies: Codeine and Hydrocodone   Assessment / Plan:     Visit Diagnoses: No diagnosis found.  Orders: No orders of the  defined types were placed in this encounter.  No orders of the defined types were placed in this encounter.   Face-to-face time spent with patient was *** minutes. Greater than 50% of time was spent in counseling and coordination of care.  Follow-Up Instructions: No follow-ups on file.   Earnestine Mealing, CMA  Note - This record has been created using Editor, commissioning.  Chart creation errors have been sought, but may not always  have been located. Such creation errors do not reflect on  the standard of medical care.

## 2021-08-14 ENCOUNTER — Other Ambulatory Visit: Payer: Self-pay | Admitting: Physician Assistant

## 2021-08-14 DIAGNOSIS — M3390 Dermatopolymyositis, unspecified, organ involvement unspecified: Secondary | ICD-10-CM

## 2021-08-18 NOTE — Telephone Encounter (Signed)
Next Visit: 08/20/2021   Last Visit: 03/18/2021   Last Fill: 07/02/2021 (30 day supply)   DX: Dermatomyositis   Current Dose per office note 03/18/2021: Imuran 100 mg by mouth daily   Labs: 07/04/2021 CBC and CMP WNL   Okay to refill Imuran?

## 2021-08-20 ENCOUNTER — Ambulatory Visit: Payer: Medicare Other | Admitting: Rheumatology

## 2021-08-20 DIAGNOSIS — M19041 Primary osteoarthritis, right hand: Secondary | ICD-10-CM

## 2021-08-20 DIAGNOSIS — M3390 Dermatopolymyositis, unspecified, organ involvement unspecified: Secondary | ICD-10-CM

## 2021-08-20 DIAGNOSIS — Z79899 Other long term (current) drug therapy: Secondary | ICD-10-CM

## 2021-08-20 DIAGNOSIS — E559 Vitamin D deficiency, unspecified: Secondary | ICD-10-CM

## 2021-08-20 DIAGNOSIS — M3501 Sicca syndrome with keratoconjunctivitis: Secondary | ICD-10-CM

## 2021-08-20 DIAGNOSIS — Z8639 Personal history of other endocrine, nutritional and metabolic disease: Secondary | ICD-10-CM

## 2021-08-20 DIAGNOSIS — M47816 Spondylosis without myelopathy or radiculopathy, lumbar region: Secondary | ICD-10-CM

## 2021-08-20 DIAGNOSIS — M19072 Primary osteoarthritis, left ankle and foot: Secondary | ICD-10-CM

## 2021-08-20 DIAGNOSIS — R5383 Other fatigue: Secondary | ICD-10-CM

## 2021-08-20 DIAGNOSIS — C4481 Basal cell carcinoma of overlapping sites of skin: Secondary | ICD-10-CM

## 2021-08-20 DIAGNOSIS — Z8659 Personal history of other mental and behavioral disorders: Secondary | ICD-10-CM

## 2021-08-24 DIAGNOSIS — B9689 Other specified bacterial agents as the cause of diseases classified elsewhere: Secondary | ICD-10-CM | POA: Diagnosis not present

## 2021-08-24 DIAGNOSIS — J019 Acute sinusitis, unspecified: Secondary | ICD-10-CM | POA: Diagnosis not present

## 2021-09-21 HISTORY — PX: BASAL CELL CARCINOMA EXCISION: SHX1214

## 2021-09-25 NOTE — Progress Notes (Signed)
Office Visit Note  Patient: Carmen Woods             Date of Birth: 1956-04-14           MRN: [id number]             PCP: Carmen Chroman, MD Referring: Carmen Chroman, MD Visit Date: 10/09/2021 Occupation: @GUAROCC @  Subjective:  Pain in both knees  History of Present Illness: Carmen Woods is a 66 y.o. female with history of dermatomyositis, sjogren's syndrome, and osteoarthritis.  She is taking imuran 100 mg daily.  She continues to tolerate Imuran without any side effects and has not missed any doses recently.  She is not experiencing any muscle weakness at this time but has noticed some increased muscular fatigue.  She is also had some increased discomfort and stiffness in both knee joints and both ankle joints.  She experiences popping and cracking in her knees especially when rising from a seated position.  She denies any joint swelling.  She continues to have some soreness and stiffness in both hands and has been performing hand exercises on a daily basis.  She experiences occasional discomfort in the left hip joint but it has been tolerable overall.  She continues to have chronic sicca symptoms.  She has been using Restasis on a daily basis for symptomatic relief.  She plans on reestablishing care with an ophthalmologist for further evaluation and recommendations.  She also needs to establish care with a new dentist due to changing insurance. She continues to see the dermatologist on a regular basis.  She has not had any signs or symptoms of dermatomyositis flare.   She has not had any recent infections.    Activities of Daily Living:  Patient reports morning stiffness for 20 minutes  Patient Denies nocturnal pain.  Difficulty dressing/grooming: Denies Difficulty climbing stairs: Reports Difficulty getting out of chair: Reports Difficulty using hands for taps, buttons, cutlery, and/or writing: Reports  Review of Systems  Constitutional:  Positive for fatigue.   HENT:  Positive for mouth dryness. Negative for mouth sores and nose dryness.   Eyes:  Positive for dryness. Negative for pain and visual disturbance.  Respiratory:  Negative for cough, hemoptysis, shortness of breath and difficulty breathing.   Cardiovascular:  Negative for chest pain, palpitations, hypertension and swelling in legs/feet.  Gastrointestinal:  Negative for blood in stool, constipation and diarrhea.  Endocrine: Negative for increased urination.  Genitourinary:  Negative for painful urination.  Musculoskeletal:  Positive for myalgias, morning stiffness, muscle tenderness and myalgias. Negative for joint pain, joint pain, joint swelling and muscle weakness.  Skin:  Negative for color change, pallor, rash, hair loss, nodules/bumps, skin tightness, ulcers and sensitivity to sunlight.  Allergic/Immunologic: Negative for susceptible to infections.  Neurological:  Negative for dizziness, numbness, headaches and weakness.  Hematological:  Negative for swollen glands.  Psychiatric/Behavioral:  Negative for depressed mood and sleep disturbance. The patient is not nervous/anxious.    PMFS History:  Patient Active Problem List   Diagnosis Date Noted   Basal cell carcinoma (BCC) 08/25/2017   Primary osteoarthritis of both hands 03/18/2017   Primary osteoarthritis of both feet 03/18/2017   High risk medication use 11/10/2016   Galtron's Papules 11/10/2016   Other fatigue 11/10/2016   Spondylosis of lumbar region without myelopathy or radiculopathy 11/10/2016   History of anxiety 11/10/2016   Vitamin D deficiency 11/10/2016   History of vertigo 11/10/2016   Sjogren's syndrome with keratoconjunctivitis sicca ([place])  11/10/2016   History of hypothyroidism 11/10/2016   Dermatomyositis ([place]) 01/07/2015   Mitral regurgitation 01/19/2013   Precordial pain 01/19/2013    Past Medical History:  Diagnosis Date   Basal cell carcinoma    DDD (degenerative disc disease), lumbar     Dermatomyositis ([place]) 12/21/2014   Difficulty swallowing    Dry eyes    Dry mouth    Hypothyroidism    Mass of left thigh 12/21/2014   Rash    hands, left thigh    Family History  Problem Relation Age of Onset   Heart Problems Mother        Pacermaker   Breast cancer Mother    Diabetes Father    Osteoarthritis Sister    Kidney disease Sister 85   Rheum arthritis Sister    Thyroid disease Sister    Past Surgical History:  Procedure Laterality Date   [place] Bilateral 11/2014   MUSCLE BIOPSY Left 01/07/2015   Procedure: LEFT THIGH MUSCLE BIOPSY;  Surgeon: Fanny Skates, MD;  Location: [facility];  Service: General;  Laterality: Left;   skin cancer removal Right 2020   hand   skin cancer removal   07/2017   Social History   Social History Narrative   Not on file   Immunization History  Administered Date(s) Administered   Influenza,inj,Quad PF,6+ Mos 09/29/2018   Influenza,inj,quad, With Preservative 07/12/2017   Moderna Sars-Covid-2 Vaccination 11/30/2019, 12/27/2019     Objective: Vital Signs: BP 114/70 (BP Location: Left Arm, Patient Position: Sitting, Cuff Size: Small)    Pulse 61    Resp 12    Ht 5\' 7"  (1.702 m)    Wt 183 lb 6.4 oz (83.2 kg)    BMI 28.72 kg/m    Physical Exam Vitals and nursing note reviewed.  Constitutional:      Appearance: She is well-developed.  HENT:     Head: Normocephalic and atraumatic.  Eyes:     Conjunctiva/sclera: Conjunctivae normal.  Cardiovascular:     Rate and Rhythm: Normal rate and regular rhythm.     Heart sounds: Normal heart sounds.  Pulmonary:     Effort: Pulmonary effort is normal.     Breath sounds: Normal breath sounds.  Abdominal:     Palpations: Abdomen is soft.  Musculoskeletal:     Cervical back: Normal range of motion.  Skin:    General: Skin is warm and dry.     Capillary Refill: Capillary refill takes less than 2 seconds.  Neurological:     Mental  Status: She is alert and oriented to person, place, and time.  Psychiatric:        Behavior: Behavior normal.     Musculoskeletal Exam: C-spine, thoracic spine, and lumbar spine good ROM.  No midline spinal tenderness.  Shoulder joints, elbow joints, wrist joints, MCPs, PIPs, and DIPs good ROM with no synovitis.  Complete fist formation bilaterally.  PIP and DIP thickening consistent with osteoarthritis of both hands.  Left hip has slightly limited ROM.  Both knee joints have good ROM with crepitus.  No warmth or effusion of knee joints.  Ankle joints have good ROM with no tenderness or joint swelling.   CDAI Exam: CDAI Score: -- Patient Global: --; Provider Global: -- Swollen: --; Tender: -- Joint Exam 10/09/2021   No joint exam has been documented for this visit   There is currently no information documented on the homunculus. Go to the Rheumatology  activity and complete the homunculus joint exam.  Investigation: No additional findings.  Imaging: No results found.  Recent Labs: Lab Results  Component Value Date   WBC 4.5 07/04/2021   HGB 12.9 07/04/2021   PLT 225 07/04/2021   NA 142 07/04/2021   K 4.2 07/04/2021   CL 105 07/04/2021   CO2 30 07/04/2021   GLUCOSE 97 07/04/2021   BUN 7 07/04/2021   CREATININE 0.62 07/04/2021   BILITOT 0.5 07/04/2021   ALKPHOS 99 03/22/2017   AST 20 07/04/2021   ALT 10 07/04/2021   PROT 6.7 07/04/2021   ALBUMIN 4.3 03/22/2017   CALCIUM 9.8 07/04/2021   GFRAA 108 03/18/2021    Speciality Comments: No specialty comments available.  Procedures:  No procedures performed Allergies: Codeine and Hydrocodone   Assessment / Plan:     Visit Diagnoses: Dermatomyositis ([place]) - Positive biopsy May 2016, Ro 52 positive, elevated CK and aldolase, Gottron's papules, muscle weakness, fatigue, positive Ro, positive anti-chromatin: She is not exhibiting any signs or symptoms of a dermatomyositis flare.  She is clinically doing well taking Imuran 100  mg daily.  She is tolerating Imuran without any side effects.  She has full strength of upper and lower extremities on examination today.  Her CK was 89 on 03/18/2021.  We will recheck CK today and will continue to be rechecked every 3 months.  She has not had any skin rashes or lesions consistent with a dermatomyositis skin disease flare.  Discussed the increased risk for malignancy in patients with history of dermatomyositis. Discussed signs and symptoms of a flare to monitor for.  She will remain on Imuran as prescribed.  She will follow-up in the office in 5 months.- Plan: CK, CK  High risk medication use - Imuran 100 mg by mouth daily.  CBC and CMP within normal limits on 07/04/2021.  She is due to update lab work today.  Orders for CBC and CMP were released.  Her next lab work will be due in April and every 3 months to monitor for drug toxicity.  Standing orders for CBC and CMP are in place.- Plan: CBC with Differential/Platelet, COMPLETE METABOLIC PANEL WITH GFR She has not had any recent infections.  Sjogren's syndrome with keratoconjunctivitis sicca ([place]) -She continues to have chronic sicca symptoms.  She has been using Restasis on a daily basis for symptomatic relief but has not found it to be as effective as it was in the past.  She was a previous patient at [facility] but has not followed up in several years.  She was advised to schedule follow-up visit for further evaluation and management of her symptoms.  She continues to use over-the-counter products for mouth dryness.  Discussed the importance of proper oral health.  She was advised to schedule appointment with her dentist for routine follow-up.  Discussed the increased risk for lymphoma in patients with Sjogren's syndrome.  The following lab work will be obtained today.  She was advised to notify us if she develops any new or worsening symptoms.  Plan: CBC with Differential/Platelet, Serum protein electrophoresis with reflex, ANA,  Urinalysis, Routine w reflex microscopic, C3 and C4, Sjogrens syndrome-A extractable nuclear antibody, Sjogrens syndrome-B extractable nuclear antibody, Rheumatoid factor  Primary osteoarthritis of both hands: She has PIP and DIP thickening consistent with osteoarthritis of both hands.  She has some tenderness over bilateral second and third PIP joints.  Complete fist formation noted bilaterally.  She has been performing hand exercises on  a daily basis.  Discussed the importance of joint protection and muscle strengthening.  Primary osteoarthritis of both knees: She presents today with increased discomfort and stiffness in both knee joints over the past several months.  She has not had any recent injuries.  No mechanical symptoms. On examination she has good range of motion of both knee joints with crepitus.  No warmth or effusion was noted.  She declined updated x-rays of her knee joints today.  She declined cortisone or Visco gel injections at this time.  Discussed the importance of lower extremity muscle strengthening.  She was given a handout of knee joint exercises to perform.  Discussed the importance of remaining active.  She was advised to notify us if she develops increased joint pain or joint swelling.  Primary osteoarthritis of both feet: She is been having some increased discomfort in both ankle joints which she attributes to gait changes related to increased pain and stiffness in her knee joints.  On examination today she had good range of motion of both ankle joints with no tenderness or inflammation.  Discussed the importance of wearing proper fitting shoes.  Spondylosis of lumbar region without myelopathy or radiculopathy: She is not experiencing any increased discomfort in her lower back at this time. No symptoms of radiculopathy.   Other fatigue: Chronic, stable.  Discussed the importance of regular exercise and good sleep hygiene.   History of hypothyroidism - Patient requested to  have TSH rechecked today. Plan: TSH  Vitamin D deficiency -Vitamin D level will be checked today.  Plan: VITAMIN D 25 Hydroxy (Vit-D Deficiency, Fractures)  Basal cell carcinoma (BCC) of overlapping sites of skin - She is followed closely by dermatology.   History of anxiety  Orders: Orders Placed This Encounter  Procedures   CBC with Differential/Platelet   COMPLETE METABOLIC PANEL WITH GFR   CK   VITAMIN D 25 Hydroxy (Vit-D Deficiency, Fractures)   CK   TSH   Serum protein electrophoresis with reflex   ANA   Urinalysis, Routine w reflex microscopic   C3 and C4   Sjogrens syndrome-A extractable nuclear antibody   Sjogrens syndrome-B extractable nuclear antibody   Rheumatoid factor   No orders of the defined types were placed in this encounter.  Follow-Up Instructions: Return in about 5 months (around 03/09/2022) for Dermatomyositis, Osteoarthritis, Sjogren's syndrome.   Ofilia Neas, PA-C  Note - This record has been created using Dragon software.  Chart creation errors have been sought, but may not always  have been located. Such creation errors do not reflect on  the standard of medical care.

## 2021-10-09 ENCOUNTER — Ambulatory Visit: Payer: Medicare Other | Admitting: Physician Assistant

## 2021-10-09 ENCOUNTER — Other Ambulatory Visit: Payer: Self-pay

## 2021-10-09 ENCOUNTER — Encounter: Payer: Self-pay | Admitting: Physician Assistant

## 2021-10-09 VITALS — BP 114/70 | HR 61 | Resp 12 | Ht 67.0 in | Wt 183.4 lb

## 2021-10-09 DIAGNOSIS — M19041 Primary osteoarthritis, right hand: Secondary | ICD-10-CM

## 2021-10-09 DIAGNOSIS — R5383 Other fatigue: Secondary | ICD-10-CM | POA: Diagnosis not present

## 2021-10-09 DIAGNOSIS — M3390 Dermatopolymyositis, unspecified, organ involvement unspecified: Secondary | ICD-10-CM

## 2021-10-09 DIAGNOSIS — M3501 Sicca syndrome with keratoconjunctivitis: Secondary | ICD-10-CM | POA: Diagnosis not present

## 2021-10-09 DIAGNOSIS — E559 Vitamin D deficiency, unspecified: Secondary | ICD-10-CM | POA: Diagnosis not present

## 2021-10-09 DIAGNOSIS — M19072 Primary osteoarthritis, left ankle and foot: Secondary | ICD-10-CM

## 2021-10-09 DIAGNOSIS — Z79899 Other long term (current) drug therapy: Secondary | ICD-10-CM

## 2021-10-09 DIAGNOSIS — M19071 Primary osteoarthritis, right ankle and foot: Secondary | ICD-10-CM

## 2021-10-09 DIAGNOSIS — C4481 Basal cell carcinoma of overlapping sites of skin: Secondary | ICD-10-CM | POA: Diagnosis not present

## 2021-10-09 DIAGNOSIS — M170 Bilateral primary osteoarthritis of knee: Secondary | ICD-10-CM | POA: Diagnosis not present

## 2021-10-09 DIAGNOSIS — Z8639 Personal history of other endocrine, nutritional and metabolic disease: Secondary | ICD-10-CM | POA: Diagnosis not present

## 2021-10-09 DIAGNOSIS — M47816 Spondylosis without myelopathy or radiculopathy, lumbar region: Secondary | ICD-10-CM

## 2021-10-09 DIAGNOSIS — Z8659 Personal history of other mental and behavioral disorders: Secondary | ICD-10-CM

## 2021-10-09 DIAGNOSIS — M19042 Primary osteoarthritis, left hand: Secondary | ICD-10-CM

## 2021-10-09 NOTE — Patient Instructions (Addendum)
Standing Labs We placed an order today for your standing lab work.   Please have your standing labs drawn in April and every 3 months  If possible, please have your labs drawn 2 weeks prior to your appointment so that the provider can discuss your results at your appointment.  Please note that you may see your imaging and lab results in [facility] before we have reviewed them. We may be awaiting multiple results to interpret others before contacting you. Please allow our office up to 72 hours to thoroughly review all of the results before contacting the office for clarification of your results.  We have open lab daily: Monday through Thursday from 1:30-4:30 PM and Friday from 1:30-4:00 PM at the office of Dr. Bo Merino, [facility].   Please be advised, all patients with office appointments requiring lab work will take precedent over walk-in lab work.  If possible, please come for your lab work on Monday and Friday afternoons, as you may experience shorter wait times. The office is located at [address], [place], [place], [place] [address] No appointment is necessary.   Labs are drawn by [facility]. Please bring your co-pay at the time of your lab draw.  You may receive a bill from [facility] for your lab work.  Please note if you are on Hydroxychloroquine and and an order has been placed for a Hydroxychloroquine level, you will need to have it drawn 4 hours or more after your last dose.  If you wish to have your labs drawn at another location, please call the office 24 hours in advance to send orders.  If you have any questions regarding directions or hours of operation,  please call [phone].   As a reminder, please drink plenty of water prior to coming for your lab work. Thanks!   Knee Exercises Ask your health care provider which exercises are safe for you. Do exercises exactly as told by your health care provider and adjust them as directed. It is normal to feel  mild stretching, pulling, tightness, or discomfort as you do these exercises. Stop right away if you feel sudden pain or your pain gets worse. Do not begin these exercises until told by your health care provider. Stretching and range-of-motion exercises These exercises warm up your muscles and joints and improve the movement and flexibility of your knee. These exercises also help to relieve pain and swelling. Knee extension, prone  Lie on your abdomen (prone position) on a bed. Place your left / right knee just beyond the edge of the surface so your knee is not on the bed. You can put a towel under your left / right thigh just above your kneecap for comfort. Relax your leg muscles and allow gravity to straighten your knee (extension). You should feel a stretch behind your left / right knee. Hold this position for __________ seconds. Scoot up so your knee is supported between repetitions. Repeat __________ times. Complete this exercise __________ times a day. Knee flexion, active  Lie on your back with both legs straight. If this causes back discomfort, bend your left / right knee so your foot is flat on the floor. Slowly slide your left / right heel back toward your buttocks. Stop when you feel a gentle stretch in the front of your knee or thigh (flexion). Hold this position for __________ seconds. Slowly slide your left / right heel back to the starting position. Repeat __________ times. Complete this exercise __________ times a day. Quadriceps  stretch, prone  Lie on your abdomen on a firm surface, such as a bed or padded floor. Bend your left / right knee and hold your ankle. If you cannot reach your ankle or pant leg, loop a belt around your foot and grab the belt instead. Gently pull your heel toward your buttocks. Your knee should not slide out to the side. You should feel a stretch in the front of your thigh and knee (quadriceps). Hold this position for __________ seconds. Repeat  __________ times. Complete this exercise __________ times a day. Hamstring, supine  Lie on your back (supine position). Loop a belt or towel over the ball of your left / right foot. The ball of your foot is on the walking surface, right under your toes. Straighten your left / right knee and slowly pull on the belt to raise your leg until you feel a gentle stretch behind your knee (hamstring). Do not let your knee bend while you do this. Keep your other leg flat on the floor. Hold this position for __________ seconds. Repeat __________ times. Complete this exercise __________ times a day. Strengthening exercises These exercises build strength and endurance in your knee. Endurance is the ability to use your muscles for a long time, even after they get tired. Quadriceps, isometric This exercise strengthens the muscles in front of your thigh (quadriceps) without moving your knee joint (isometric). Lie on your back with your left / right leg extended and your other knee bent. Put a rolled towel or small pillow under your knee if told by your health care provider. Slowly tense the muscles in the front of your left / right thigh. You should see your kneecap slide up toward your hip or see increased dimpling just above the knee. This motion will push the back of the knee toward the floor. For __________ seconds, hold the muscle as tight as you can without increasing your pain. Relax the muscles slowly and completely. Repeat __________ times. Complete this exercise __________ times a day. Straight leg raises This exercise strengthens the muscles in front of your thigh (quadriceps) and the muscles that move your hips (hip flexors). Lie on your back with your left / right leg extended and your other knee bent. Tense the muscles in the front of your left / right thigh. You should see your kneecap slide up or see increased dimpling just above the knee. Your thigh may even shake a bit. Keep these muscles  tight as you raise your leg 4-6 inches (10-15 cm) off the floor. Do not let your knee bend. Hold this position for __________ seconds. Keep these muscles tense as you lower your leg. Relax your muscles slowly and completely after each repetition. Repeat __________ times. Complete this exercise __________ times a day. Hamstring, isometric  Lie on your back on a firm surface. Bend your left / right knee about __________ degrees. Dig your left / right heel into the surface as if you are trying to pull it toward your buttocks. Tighten the muscles in the back of your thighs (hamstring) to "dig" as hard as you can without increasing any pain. Hold this position for __________ seconds. Release the tension gradually and allow your muscles to relax completely for __________ seconds after each repetition. Repeat __________ times. Complete this exercise __________ times a day. Hamstring curls If told by your health care provider, do this exercise while wearing ankle weights. Begin with __________lb / kg weights. Then increase the weight by 1 lb (0.5  kg) increments. Do not wear ankle weights that are more than __________lb / kg. Lie on your abdomen with your legs straight. Bend your left / right knee as far as you can without feeling pain. Keep your hips flat against the floor. Hold this position for __________ seconds. Slowly lower your leg to the starting position. Repeat __________ times. Complete this exercise __________ times a day. Squats This exercise strengthens the muscles in front of your thigh and knee (quadriceps). Stand in front of a table, with your feet and knees pointing straight ahead. You may rest your hands on the table for balance but not for support. Slowly bend your knees and lower your hips like you are going to sit in a chair. Keep your weight over your heels, not over your toes. Keep your lower legs upright so they are parallel with the table legs. Do not let your hips go lower  than your knees. Do not bend lower than told by your health care provider. If your knee pain increases, do not bend as low. Hold the squat position for __________ seconds. Slowly push with your legs to return to standing. Do not use your hands to pull yourself to standing. Repeat __________ times. Complete this exercise __________ times a day. Wall slides This exercise strengthens the muscles in front of your thigh and knee (quadriceps). Lean your back against a smooth wall or door, and walk your feet out 18-24 inches (46-61 cm) from it. Place your feet hip-width apart. Slowly slide down the wall or door until your knees bend __________ degrees. Keep your knees over your heels, not over your toes. Keep your knees in line with your hips. Hold this position for __________ seconds. Repeat __________ times. Complete this exercise __________ times a day. Straight leg raises, side-lying This exercise strengthens the muscles that rotate the leg at the hip and move it away from your body (hip abductors). Lie on your side with your left / right leg in the top position. Lie so your head, shoulder, knee, and hip line up. You may bend your bottom knee to help you keep your balance. Roll your hips slightly forward so your hips are stacked directly over each other and your left / right knee is facing forward. Leading with your heel, lift your top leg 4-6 inches (10-15 cm). You should feel the muscles in your outer hip lifting. Do not let your foot drift forward. Do not let your knee roll toward the ceiling. Hold this position for __________ seconds. Slowly return your leg to the starting position. Let your muscles relax completely after each repetition. Repeat __________ times. Complete this exercise __________ times a day. Straight leg raises, prone This exercise stretches the muscles that move your hips away from the front of the pelvis (hip extensors). Lie on your abdomen on a firm surface. You can  put a pillow under your hips if that is more comfortable. Tense the muscles in your buttocks and lift your left / right leg about 4-6 inches (10-15 cm). Keep your knee straight as you lift your leg. Hold this position for __________ seconds. Slowly lower your leg to the starting position. Let your leg relax completely after each repetition. Repeat __________ times. Complete this exercise __________ times a day. This information is not intended to replace advice given to you by your health care provider. Make sure you discuss any questions you have with your health care provider. Document Revised: 05/20/2021 Document Reviewed: 05/20/2021 Elsevier Patient Education  [place].

## 2021-10-13 NOTE — Progress Notes (Signed)
Absolute lymphocyte count is low.  WBC count is stable.  CMP stable.  Ro antibody remains positive. La antibody is negative.  CK WNL.  Vitamin D WNL.  Complements WNL.  TSH WNL. RF negative.   UA normal.

## 2021-10-13 NOTE — Progress Notes (Signed)
ANA remains positive but low titer.

## 2021-10-14 LAB — COMPLETE METABOLIC PANEL WITH GFR
AG Ratio: 1.8 (calc) (ref 1.0–2.5)
ALT: 10 U/L (ref 6–29)
AST: 20 U/L (ref 10–35)
Albumin: 4.5 g/dL (ref 3.6–5.1)
Alkaline phosphatase (APISO): 81 U/L (ref 37–153)
BUN: 11 mg/dL (ref 7–25)
CO2: 34 mmol/L — ABNORMAL HIGH (ref 20–32)
Calcium: 9.9 mg/dL (ref 8.6–10.4)
Chloride: 104 mmol/L (ref 98–110)
Creat: 0.65 mg/dL (ref 0.50–1.05)
Globulin: 2.5 g/dL (calc) (ref 1.9–3.7)
Glucose, Bld: 106 mg/dL — ABNORMAL HIGH (ref 65–99)
Potassium: 4.3 mmol/L (ref 3.5–5.3)
Sodium: 141 mmol/L (ref 135–146)
Total Bilirubin: 0.5 mg/dL (ref 0.2–1.2)
Total Protein: 7 g/dL (ref 6.1–8.1)
eGFR: 98 mL/min/{1.73_m2} (ref >=60)

## 2021-10-14 LAB — ANTI-NUCLEAR AB-TITER (ANA TITER)
ANA TITER: 1:160 {titer} — ABNORMAL HIGH
ANA Titer 1: 1:160 {titer} — ABNORMAL HIGH

## 2021-10-14 LAB — PROTEIN ELECTROPHORESIS, SERUM, WITH REFLEX
Albumin ELP: 4.1 g/dL (ref 3.8–4.8)
Alpha 1: 0.3 g/dL (ref 0.2–0.3)
Alpha 2: 0.6 g/dL (ref 0.5–0.9)
Beta 2: 0.3 g/dL (ref 0.2–0.5)
Beta Globulin: 0.5 g/dL (ref 0.4–0.6)
Gamma Globulin: 1.1 g/dL (ref 0.8–1.7)
Total Protein: 6.9 g/dL (ref 6.1–8.1)

## 2021-10-14 LAB — CBC WITH DIFFERENTIAL/PLATELET
Absolute Monocytes: 383 cells/uL (ref 200–950)
Basophils Absolute: 41 cells/uL (ref 0–200)
Basophils Relative: 0.7 %
Eosinophils Absolute: 249 cells/uL (ref 15–500)
Eosinophils Relative: 4.3 %
HCT: 39.7 % (ref 35.0–45.0)
Hemoglobin: 13.5 g/dL (ref 11.7–15.5)
Lymphs Abs: 719 cells/uL — ABNORMAL LOW (ref 850–3900)
MCH: 30 pg (ref 27.0–33.0)
MCHC: 34 g/dL (ref 32.0–36.0)
MCV: 88.2 fL (ref 80.0–100.0)
MPV: 10.4 fL (ref 7.5–12.5)
Monocytes Relative: 6.6 %
Neutro Abs: 4408 cells/uL (ref 1500–7800)
Neutrophils Relative %: 76 %
Platelets: 218 10*3/uL (ref 140–400)
RBC: 4.5 10*6/uL (ref 3.80–5.10)
RDW: 12.7 % (ref 11.0–15.0)
Total Lymphocyte: 12.4 %
WBC: 5.8 10*3/uL (ref 3.8–10.8)

## 2021-10-14 LAB — SJOGRENS SYNDROME-A EXTRACTABLE NUCLEAR ANTIBODY: SSA (Ro) (ENA) Antibody, IgG: 7.6 AI — AB

## 2021-10-14 LAB — URINALYSIS, ROUTINE W REFLEX MICROSCOPIC
Bilirubin Urine: NEGATIVE
Glucose, UA: NEGATIVE
Hgb urine dipstick: NEGATIVE
Ketones, ur: NEGATIVE
Leukocytes,Ua: NEGATIVE
Nitrite: NEGATIVE
Protein, ur: NEGATIVE
Specific Gravity, Urine: 1.016 (ref 1.001–1.035)
pH: 5.5 (ref 5.0–8.0)

## 2021-10-14 LAB — SJOGRENS SYNDROME-B EXTRACTABLE NUCLEAR ANTIBODY: SSB (La) (ENA) Antibody, IgG: <1 AI

## 2021-10-14 LAB — C3 AND C4
C3 Complement: 162 mg/dL (ref 83–193)
C4 Complement: 29 mg/dL (ref 15–57)

## 2021-10-14 LAB — CK: Total CK: 70 U/L (ref 29–143)

## 2021-10-14 LAB — VITAMIN D 25 HYDROXY (VIT D DEFICIENCY, FRACTURES): Vit D, 25-Hydroxy: 70 ng/mL (ref 30–100)

## 2021-10-14 LAB — RHEUMATOID FACTOR: Rheumatoid fact SerPl-aCnc: <14 IU/mL (ref <14)

## 2021-10-14 LAB — ANA: Anti Nuclear Antibody (ANA): POSITIVE — AB

## 2021-10-14 LAB — TSH: TSH: 2.87 mIU/L (ref 0.40–4.50)

## 2021-10-14 NOTE — Progress Notes (Signed)
SPEP did not reveal any abnormal protein bands.

## 2021-10-23 ENCOUNTER — Other Ambulatory Visit: Payer: Self-pay | Admitting: Physician Assistant

## 2021-10-23 DIAGNOSIS — M3390 Dermatopolymyositis, unspecified, organ involvement unspecified: Secondary | ICD-10-CM

## 2021-10-23 NOTE — Telephone Encounter (Signed)
Next Visit: 03/11/2022  Last Visit: 10/09/2021   Last Fill: 08/18/2021  DX: Dermatomyositis  Current Dose per office note 10/09/2021: Imuran 100 mg by mouth daily  Labs: 10/09/2021 Absolute lymphocyte count is low.  WBC count is stable.  CMP stable.    Okay to refill Imuran?

## 2021-10-30 DIAGNOSIS — I10 Essential (primary) hypertension: Secondary | ICD-10-CM | POA: Diagnosis not present

## 2021-10-30 DIAGNOSIS — R519 Headache, unspecified: Secondary | ICD-10-CM | POA: Diagnosis not present

## 2021-10-30 DIAGNOSIS — Z299 Encounter for prophylactic measures, unspecified: Secondary | ICD-10-CM | POA: Diagnosis not present

## 2021-10-30 DIAGNOSIS — M5030 Other cervical disc degeneration, unspecified cervical region: Secondary | ICD-10-CM | POA: Diagnosis not present

## 2021-10-30 DIAGNOSIS — E0590 Thyrotoxicosis, unspecified without thyrotoxic crisis or storm: Secondary | ICD-10-CM | POA: Diagnosis not present

## 2021-10-30 DIAGNOSIS — M542 Cervicalgia: Secondary | ICD-10-CM | POA: Diagnosis not present

## 2021-10-30 DIAGNOSIS — S161XXA Strain of muscle, fascia and tendon at neck level, initial encounter: Secondary | ICD-10-CM | POA: Diagnosis not present

## 2022-01-15 ENCOUNTER — Other Ambulatory Visit: Payer: Self-pay | Admitting: Physician Assistant

## 2022-01-15 DIAGNOSIS — M3390 Dermatopolymyositis, unspecified, organ involvement unspecified: Secondary | ICD-10-CM

## 2022-01-15 DIAGNOSIS — M3313 Other dermatomyositis without myopathy: Secondary | ICD-10-CM

## 2022-01-16 NOTE — Telephone Encounter (Signed)
Next Visit: 03/11/2022 ?  ?Last Visit: 10/09/2021  ?  ?Last Fill: 10/23/2021 ?  ?DX: Dermatomyositis ?  ?Current Dose per office note 10/09/2021: Imuran 100 mg by mouth daily ?  ?Labs: 10/09/2021 Absolute lymphocyte count is low.  WBC count is stable.  CMP stable.   ? ?Left message to advise patient she is due to update labs.  ?  ?Okay to refill Imuran?  ?

## 2022-01-20 DIAGNOSIS — Z299 Encounter for prophylactic measures, unspecified: Secondary | ICD-10-CM | POA: Diagnosis not present

## 2022-01-20 DIAGNOSIS — I10 Essential (primary) hypertension: Secondary | ICD-10-CM | POA: Diagnosis not present

## 2022-01-20 DIAGNOSIS — D849 Immunodeficiency, unspecified: Secondary | ICD-10-CM | POA: Diagnosis not present

## 2022-01-21 DIAGNOSIS — M79604 Pain in right leg: Secondary | ICD-10-CM | POA: Diagnosis not present

## 2022-01-21 DIAGNOSIS — I872 Venous insufficiency (chronic) (peripheral): Secondary | ICD-10-CM | POA: Diagnosis not present

## 2022-01-21 DIAGNOSIS — I87393 Chronic venous hypertension (idiopathic) with other complications of bilateral lower extremity: Secondary | ICD-10-CM | POA: Diagnosis not present

## 2022-01-21 DIAGNOSIS — M79605 Pain in left leg: Secondary | ICD-10-CM | POA: Diagnosis not present

## 2022-02-05 ENCOUNTER — Other Ambulatory Visit: Payer: Self-pay | Admitting: Internal Medicine

## 2022-02-17 ENCOUNTER — Other Ambulatory Visit: Payer: Self-pay | Admitting: Internal Medicine

## 2022-02-17 DIAGNOSIS — M79622 Pain in left upper arm: Secondary | ICD-10-CM

## 2022-02-26 ENCOUNTER — Ambulatory Visit
Admission: RE | Admit: 2022-02-26 | Discharge: 2022-02-26 | Disposition: A | Discharge status: Home / Self Care | Payer: Medicare Other | Source: Ambulatory Visit | Attending: Internal Medicine | Admitting: Internal Medicine

## 2022-02-26 DIAGNOSIS — M79622 Pain in left upper arm: Secondary | ICD-10-CM

## 2022-02-26 DIAGNOSIS — N644 Mastodynia: Secondary | ICD-10-CM | POA: Diagnosis not present

## 2022-03-02 ENCOUNTER — Other Ambulatory Visit: Payer: Medicare Other

## 2022-03-03 DIAGNOSIS — G2581 Restless legs syndrome: Secondary | ICD-10-CM | POA: Diagnosis not present

## 2022-03-03 DIAGNOSIS — I83893 Varicose veins of bilateral lower extremities with other complications: Secondary | ICD-10-CM | POA: Diagnosis not present

## 2022-03-05 NOTE — Progress Notes (Unsigned)
Office Visit Note  Patient: Carmen Woods             Date of Birth: 08/31/1956           MRN: [id number]             PCP: Glenda Chroman, MD Referring: Glenda Chroman, MD Visit Date: 03/11/2022 Occupation: '@GUAROCC'$ @  Subjective:  No chief complaint on file.   History of Present Illness: Carmen Woods is a 66 y.o. female ***   Activities of Daily Living:  Patient reports morning stiffness for *** {minute/hour:19697}.   Patient {ACTIONS;DENIES/REPORTS:21021675::"Denies"} nocturnal pain.  Difficulty dressing/grooming: {ACTIONS;DENIES/REPORTS:21021675::"Denies"} Difficulty climbing stairs: {ACTIONS;DENIES/REPORTS:21021675::"Denies"} Difficulty getting out of chair: {ACTIONS;DENIES/REPORTS:21021675::"Denies"} Difficulty using hands for taps, buttons, cutlery, and/or writing: {ACTIONS;DENIES/REPORTS:21021675::"Denies"}  No Rheumatology ROS completed.   PMFS History:  Patient Active Problem List   Diagnosis Date Noted   Basal cell carcinoma (BCC) 08/25/2017   Primary osteoarthritis of both hands 03/18/2017   Primary osteoarthritis of both feet 03/18/2017   High risk medication use 11/10/2016   Galtron's Papules 11/10/2016   Other fatigue 11/10/2016   Spondylosis of lumbar region without myelopathy or radiculopathy 11/10/2016   History of anxiety 11/10/2016   Vitamin D deficiency 11/10/2016   History of vertigo 11/10/2016   Sjogren's syndrome with keratoconjunctivitis sicca ([place]) 11/10/2016   History of hypothyroidism 11/10/2016   Dermatomyositis ([place]) 01/07/2015   Mitral regurgitation 01/19/2013   Precordial pain 01/19/2013    Past Medical History:  Diagnosis Date   Basal cell carcinoma    DDD (degenerative disc disease), lumbar    Dermatomyositis ([place]) 12/21/2014   Difficulty swallowing    Dry eyes    Dry mouth    Hypothyroidism    Mass of left thigh 12/21/2014   Rash    hands, left thigh    Family History  Problem Relation Age of Onset   Heart  Problems Mother        Pacermaker   Breast cancer Mother    Diabetes Father    Osteoarthritis Sister    Kidney disease Sister 28   Rheum arthritis Sister    Thyroid disease Sister    Breast cancer Cousin        Maternal cousin   Past Surgical History:  Procedure Laterality Date   [place] Bilateral 11/2014   MUSCLE BIOPSY Left 01/07/2015   Procedure: LEFT THIGH MUSCLE BIOPSY;  Surgeon: Fanny Skates, MD;  Location: [facility];  Service: General;  Laterality: Left;   skin cancer removal Right 2020   hand   skin cancer removal   07/2017   Social History   Social History Narrative   Not on file   Immunization History  Administered Date(s) Administered   Influenza,inj,Quad PF,6+ Mos 09/29/2018   Influenza,inj,quad, With Preservative 07/12/2017   Moderna Sars-Covid-2 Vaccination 11/30/2019, 12/27/2019     Objective: Vital Signs: There were no vitals taken for this visit.   Physical Exam   Musculoskeletal Exam: ***  CDAI Exam: CDAI Score: -- Patient Global: --; Provider Global: -- Swollen: --; Tender: -- Joint Exam 03/11/2022   No joint exam has been documented for this visit   There is currently no information documented on the homunculus. Go to the Rheumatology activity and complete the homunculus joint exam.  Investigation: No additional findings.  Imaging: MM DIAG BREAST TOMO BILATERAL  Result Date: 02/26/2022 CLINICAL DATA:  Patient describes focal pain in the LEFT axilla. Patient describes  a recent increase in physical activity. EXAM: DIGITAL DIAGNOSTIC BILATERAL MAMMOGRAM WITH TOMOSYNTHESIS AND CAD; [place] AXILLARY LEFT TECHNIQUE: Bilateral digital diagnostic mammography and breast tomosynthesis was performed. The images were evaluated with computer-aided detection.; Targeted ultrasound examination of the left axilla was performed. COMPARISON:  Previous exam(s). ACR Breast Density Category c: The breast tissue is  heterogeneously dense, which may obscure small masses. FINDINGS: There are no new dominant masses, suspicious calcifications or secondary signs of malignancy within either breast. Also, there is no mammographic abnormality identified within the visualized portion of the LEFT axilla corresponding to the area of clinical concern. On physical exam, I feel no fixed or circumscribed mass within the LEFT axilla Targeted ultrasound is performed, evaluating the LEFT axilla as directed by the patient, showing only normal soft tissues. No solid or cystic mass. No enlarged lymph nodes. IMPRESSION: 1. No evidence of malignancy within either breast. 2. No evidence of malignancy or acute findings within the LEFT axilla. RECOMMENDATION: 1.  Screening mammogram in one year.(Code:SM-B-01Y) 2. Benign causes of breast and axillary pain were discussed with the patient. The patient was encouraged to follow-up with referring physician if the pain persisted or worsened as this might indicate a need to evaluate the deeper structures of the underlying chest wall. I have discussed the findings and recommendations with the patient. If applicable, a reminder letter will be sent to the patient regarding the next appointment. BI-RADS CATEGORY  1: Negative. Electronically Signed   By: Franki Cabot M.D.   On: 02/26/2022 12:29   [place] AXILLA LEFT  Result Date: 02/26/2022 CLINICAL DATA:  Patient describes focal pain in the LEFT axilla. Patient describes a recent increase in physical activity. EXAM: DIGITAL DIAGNOSTIC BILATERAL MAMMOGRAM WITH TOMOSYNTHESIS AND CAD; [place] AXILLARY LEFT TECHNIQUE: Bilateral digital diagnostic mammography and breast tomosynthesis was performed. The images were evaluated with computer-aided detection.; Targeted ultrasound examination of the left axilla was performed. COMPARISON:  Previous exam(s). ACR Breast Density Category c: The breast tissue is heterogeneously dense, which may obscure small masses. FINDINGS: There are  no new dominant masses, suspicious calcifications or secondary signs of malignancy within either breast. Also, there is no mammographic abnormality identified within the visualized portion of the LEFT axilla corresponding to the area of clinical concern. On physical exam, I feel no fixed or circumscribed mass within the LEFT axilla Targeted ultrasound is performed, evaluating the LEFT axilla as directed by the patient, showing only normal soft tissues. No solid or cystic mass. No enlarged lymph nodes. IMPRESSION: 1. No evidence of malignancy within either breast. 2. No evidence of malignancy or acute findings within the LEFT axilla. RECOMMENDATION: 1.  Screening mammogram in one year.(Code:SM-B-01Y) 2. Benign causes of breast and axillary pain were discussed with the patient. The patient was encouraged to follow-up with referring physician if the pain persisted or worsened as this might indicate a need to evaluate the deeper structures of the underlying chest wall. I have discussed the findings and recommendations with the patient. If applicable, a reminder letter will be sent to the patient regarding the next appointment. BI-RADS CATEGORY  1: Negative. Electronically Signed   By: Franki Cabot M.D.   On: 02/26/2022 12:29   Recent Labs: Lab Results  Component Value Date   WBC 5.8 10/09/2021   HGB 13.5 10/09/2021   PLT 218 10/09/2021   NA 141 10/09/2021   K 4.3 10/09/2021   CL 104 10/09/2021   CO2 34 (H) 10/09/2021   GLUCOSE 106 (H) 10/09/2021  BUN 11 10/09/2021   CREATININE 0.65 10/09/2021   BILITOT 0.5 10/09/2021   ALKPHOS 99 03/22/2017   AST 20 10/09/2021   ALT 10 10/09/2021   PROT 7.0 10/09/2021   PROT 6.9 10/09/2021   ALBUMIN 4.3 03/22/2017   CALCIUM 9.9 10/09/2021   GFRAA 108 03/18/2021    Speciality Comments: No specialty comments available.  Procedures:  No procedures performed Allergies: Codeine and Hydrocodone   Assessment / Plan:     Visit Diagnoses: Dermatomyositis  ([place])  High risk medication use  Sjogren's syndrome with keratoconjunctivitis sicca ([facility])  Primary osteoarthritis of both hands  Lateral epicondylitis, left elbow  Primary osteoarthritis of both knees  Primary osteoarthritis of both feet  Spondylosis of lumbar region without myelopathy or radiculopathy  De Quervain's tenosynovitis, left  Vitamin D deficiency  Basal cell carcinoma (BCC) of overlapping sites of skin  History of hypothyroidism  History of anxiety  Orders: No orders of the defined types were placed in this encounter.  No orders of the defined types were placed in this encounter.   Face-to-face time spent with patient was *** minutes. Greater than 50% of time was spent in counseling and coordination of care.  Follow-Up Instructions: No follow-ups on file.   Bo Merino, MD  Note - This record has been created using Editor, commissioning.  Chart creation errors have been sought, but may not always  have been located. Such creation errors do not reflect on  the standard of medical care.

## 2022-03-07 ENCOUNTER — Other Ambulatory Visit: Payer: Self-pay | Admitting: Physician Assistant

## 2022-03-07 DIAGNOSIS — M3390 Dermatopolymyositis, unspecified, organ involvement unspecified: Secondary | ICD-10-CM

## 2022-03-11 ENCOUNTER — Encounter: Payer: Self-pay | Admitting: Rheumatology

## 2022-03-11 ENCOUNTER — Ambulatory Visit: Payer: Medicare Other | Admitting: Rheumatology

## 2022-03-11 VITALS — BP 132/73 | HR 67 | Resp 16 | Ht 67.0 in | Wt 183.0 lb

## 2022-03-11 DIAGNOSIS — Z79899 Other long term (current) drug therapy: Secondary | ICD-10-CM

## 2022-03-11 DIAGNOSIS — M170 Bilateral primary osteoarthritis of knee: Secondary | ICD-10-CM | POA: Diagnosis not present

## 2022-03-11 DIAGNOSIS — E559 Vitamin D deficiency, unspecified: Secondary | ICD-10-CM

## 2022-03-11 DIAGNOSIS — D485 Neoplasm of uncertain behavior of skin: Secondary | ICD-10-CM | POA: Diagnosis not present

## 2022-03-11 DIAGNOSIS — M3390 Dermatopolymyositis, unspecified, organ involvement unspecified: Secondary | ICD-10-CM

## 2022-03-11 DIAGNOSIS — M19042 Primary osteoarthritis, left hand: Secondary | ICD-10-CM

## 2022-03-11 DIAGNOSIS — C4481 Basal cell carcinoma of overlapping sites of skin: Secondary | ICD-10-CM | POA: Diagnosis not present

## 2022-03-11 DIAGNOSIS — M3501 Sicca syndrome with keratoconjunctivitis: Secondary | ICD-10-CM

## 2022-03-11 DIAGNOSIS — M19041 Primary osteoarthritis, right hand: Secondary | ICD-10-CM

## 2022-03-11 DIAGNOSIS — M7712 Lateral epicondylitis, left elbow: Secondary | ICD-10-CM

## 2022-03-11 DIAGNOSIS — R21 Rash and other nonspecific skin eruption: Secondary | ICD-10-CM | POA: Diagnosis not present

## 2022-03-11 DIAGNOSIS — M19072 Primary osteoarthritis, left ankle and foot: Secondary | ICD-10-CM

## 2022-03-11 DIAGNOSIS — C44719 Basal cell carcinoma of skin of left lower limb, including hip: Secondary | ICD-10-CM | POA: Diagnosis not present

## 2022-03-11 DIAGNOSIS — M47816 Spondylosis without myelopathy or radiculopathy, lumbar region: Secondary | ICD-10-CM | POA: Diagnosis not present

## 2022-03-11 DIAGNOSIS — M19071 Primary osteoarthritis, right ankle and foot: Secondary | ICD-10-CM

## 2022-03-11 DIAGNOSIS — Z8659 Personal history of other mental and behavioral disorders: Secondary | ICD-10-CM

## 2022-03-11 DIAGNOSIS — Z8639 Personal history of other endocrine, nutritional and metabolic disease: Secondary | ICD-10-CM

## 2022-03-11 DIAGNOSIS — M654 Radial styloid tenosynovitis [de Quervain]: Secondary | ICD-10-CM

## 2022-03-11 MED ORDER — AZATHIOPRINE 50 MG PO TABS: 100.0000 mg | ORAL_TABLET | Freq: Every day | ORAL | 0 refills | Status: DC

## 2022-03-11 NOTE — Patient Instructions (Signed)
Standing Labs We placed an order today for your standing lab work.   Please have your standing labs drawn in September and every 3 months  If possible, please have your labs drawn 2 weeks prior to your appointment so that the provider can discuss your results at your appointment.  Please note that you may see your imaging and lab results in MyChart before we have reviewed them. We may be awaiting multiple results to interpret others before contacting you. Please allow our office up to 72 hours to thoroughly review all of the results before contacting the office for clarification of your results.  We have open lab daily: Monday through Thursday from 1:30-4:30 PM and Friday from 1:30-4:00 PM at the office of Dr. Trevyon Swor, [facility].   Please be advised, all patients with office appointments requiring lab work will take precedent over walk-in lab work.  If possible, please come for your lab work on Monday and Friday afternoons, as you may experience shorter wait times. The office is located at [address], [address], [place], [place] [address] No appointment is necessary.   Labs are drawn by [facility]. Please bring your co-pay at the time of your lab draw.  You may receive a bill from [facility] for your lab work.  Please note if you are on Hydroxychloroquine and and an order has been placed for a Hydroxychloroquine level, you will need to have it drawn 4 hours or more after your last dose.  If you wish to have your labs drawn at another location, please call the office 24 hours in advance to send orders.  If you have any questions regarding directions or hours of operation,  please call [phone].   As a reminder, please drink plenty of water prior to coming for your lab work. Thanks!   Vaccines You are taking a medication(s) that can suppress your immune system.  The following immunizations are recommended: Flu annually Covid-19  Td/Tdap (tetanus, diphtheria,  pertussis) every 10 years Pneumonia (Prevnar 15 then Pneumovax 23 at least 1 year apart.  Alternatively, can take Prevnar 20 without needing additional dose) Shingrix: 2 doses from 4 weeks to 6 months apart  Please check with your PCP to make sure you are up to date.   If you have signs or symptoms of an infection or start antibiotics: First, call your PCP for workup of your infection. Hold your medication through the infection, until you complete your antibiotics, and until symptoms resolve if you take the following: Injectable medication (Actemra, Benlysta, Cimzia, Cosentyx, Enbrel, Humira, Kevzara, Orencia, Remicade, Simponi, Stelara, Taltz, Tremfya) Methotrexate Leflunomide (Arava) Mycophenolate (Cellcept) Xeljanz, Olumiant, or Rinvoq  

## 2022-03-12 LAB — CBC WITH DIFFERENTIAL/PLATELET
Absolute Monocytes: 530 cells/uL (ref 200–950)
Basophils Absolute: 50 cells/uL (ref 0–200)
Basophils Relative: 1 %
Eosinophils Absolute: 230 cells/uL (ref 15–500)
Eosinophils Relative: 4.6 %
HCT: 38 % (ref 35.0–45.0)
Hemoglobin: 12.9 g/dL (ref 11.7–15.5)
Lymphs Abs: 935 cells/uL (ref 850–3900)
MCH: 29.7 pg (ref 27.0–33.0)
MCHC: 33.9 g/dL (ref 32.0–36.0)
MCV: 87.4 fL (ref 80.0–100.0)
MPV: 9.9 fL (ref 7.5–12.5)
Monocytes Relative: 10.6 %
Neutro Abs: 3255 cells/uL (ref 1500–7800)
Neutrophils Relative %: 65.1 %
Platelets: 225 10*3/uL (ref 140–400)
RBC: 4.35 10*6/uL (ref 3.80–5.10)
RDW: 12.2 % (ref 11.0–15.0)
Total Lymphocyte: 18.7 %
WBC: 5 10*3/uL (ref 3.8–10.8)

## 2022-03-12 LAB — COMPLETE METABOLIC PANEL WITH GFR
AG Ratio: 1.5 (calc) (ref 1.0–2.5)
ALT: 12 U/L (ref 6–29)
AST: 22 U/L (ref 10–35)
Albumin: 4.1 g/dL (ref 3.6–5.1)
Alkaline phosphatase (APISO): 75 U/L (ref 37–153)
BUN: 8 mg/dL (ref 7–25)
CO2: 29 mmol/L (ref 20–32)
Calcium: 9.5 mg/dL (ref 8.6–10.4)
Chloride: 106 mmol/L (ref 98–110)
Creat: 0.71 mg/dL (ref 0.50–1.05)
Globulin: 2.7 g/dL (calc) (ref 1.9–3.7)
Glucose, Bld: 101 mg/dL — ABNORMAL HIGH (ref 65–99)
Potassium: 4.4 mmol/L (ref 3.5–5.3)
Sodium: 140 mmol/L (ref 135–146)
Total Bilirubin: 0.6 mg/dL (ref 0.2–1.2)
Total Protein: 6.8 g/dL (ref 6.1–8.1)
eGFR: 94 mL/min/{1.73_m2} (ref >=60)

## 2022-03-12 LAB — SEDIMENTATION RATE: Sed Rate: 9 mm/h (ref 0–30)

## 2022-03-12 LAB — CK: Total CK: 84 U/L (ref 29–143)

## 2022-03-31 DIAGNOSIS — I83892 Varicose veins of left lower extremities with other complications: Secondary | ICD-10-CM | POA: Diagnosis not present

## 2022-04-09 DIAGNOSIS — C44719 Basal cell carcinoma of skin of left lower limb, including hip: Secondary | ICD-10-CM | POA: Diagnosis not present

## 2022-04-09 DIAGNOSIS — D485 Neoplasm of uncertain behavior of skin: Secondary | ICD-10-CM | POA: Diagnosis not present

## 2022-04-09 DIAGNOSIS — C44712 Basal cell carcinoma of skin of right lower limb, including hip: Secondary | ICD-10-CM | POA: Diagnosis not present

## 2022-04-28 DIAGNOSIS — I83812 Varicose veins of left lower extremities with pain: Secondary | ICD-10-CM | POA: Diagnosis not present

## 2022-04-28 DIAGNOSIS — H2513 Age-related nuclear cataract, bilateral: Secondary | ICD-10-CM | POA: Diagnosis not present

## 2022-04-28 DIAGNOSIS — I83892 Varicose veins of left lower extremities with other complications: Secondary | ICD-10-CM | POA: Diagnosis not present

## 2022-04-28 DIAGNOSIS — Z9889 Other specified postprocedural states: Secondary | ICD-10-CM | POA: Diagnosis not present

## 2022-04-28 DIAGNOSIS — H43813 Vitreous degeneration, bilateral: Secondary | ICD-10-CM | POA: Diagnosis not present

## 2022-04-28 DIAGNOSIS — H04123 Dry eye syndrome of bilateral lacrimal glands: Secondary | ICD-10-CM | POA: Diagnosis not present

## 2022-04-28 DIAGNOSIS — M7989 Other specified soft tissue disorders: Secondary | ICD-10-CM | POA: Diagnosis not present

## 2022-04-29 DIAGNOSIS — I83891 Varicose veins of right lower extremities with other complications: Secondary | ICD-10-CM | POA: Diagnosis not present

## 2022-05-05 ENCOUNTER — Other Ambulatory Visit: Payer: Self-pay | Admitting: Rheumatology

## 2022-05-05 DIAGNOSIS — C4491 Basal cell carcinoma of skin, unspecified: Secondary | ICD-10-CM | POA: Diagnosis not present

## 2022-05-05 DIAGNOSIS — C44612 Basal cell carcinoma of skin of right upper limb, including shoulder: Secondary | ICD-10-CM | POA: Diagnosis not present

## 2022-05-05 DIAGNOSIS — M3390 Dermatopolymyositis, unspecified, organ involvement unspecified: Secondary | ICD-10-CM

## 2022-05-05 DIAGNOSIS — C44619 Basal cell carcinoma of skin of left upper limb, including shoulder: Secondary | ICD-10-CM | POA: Diagnosis not present

## 2022-05-05 DIAGNOSIS — L820 Inflamed seborrheic keratosis: Secondary | ICD-10-CM | POA: Diagnosis not present

## 2022-05-05 DIAGNOSIS — C44712 Basal cell carcinoma of skin of right lower limb, including hip: Secondary | ICD-10-CM | POA: Diagnosis not present

## 2022-05-05 DIAGNOSIS — D485 Neoplasm of uncertain behavior of skin: Secondary | ICD-10-CM | POA: Diagnosis not present

## 2022-05-05 DIAGNOSIS — C44719 Basal cell carcinoma of skin of left lower limb, including hip: Secondary | ICD-10-CM | POA: Diagnosis not present

## 2022-05-12 DIAGNOSIS — Z7189 Other specified counseling: Secondary | ICD-10-CM | POA: Diagnosis not present

## 2022-05-12 DIAGNOSIS — Z789 Other specified health status: Secondary | ICD-10-CM | POA: Diagnosis not present

## 2022-05-12 DIAGNOSIS — Z79899 Other long term (current) drug therapy: Secondary | ICD-10-CM | POA: Diagnosis not present

## 2022-05-12 DIAGNOSIS — Z299 Encounter for prophylactic measures, unspecified: Secondary | ICD-10-CM | POA: Diagnosis not present

## 2022-05-12 DIAGNOSIS — R5383 Other fatigue: Secondary | ICD-10-CM | POA: Diagnosis not present

## 2022-05-12 DIAGNOSIS — M8580 Other specified disorders of bone density and structure, unspecified site: Secondary | ICD-10-CM | POA: Diagnosis not present

## 2022-05-12 DIAGNOSIS — Z0000 Encounter for general adult medical examination without abnormal findings: Secondary | ICD-10-CM | POA: Diagnosis not present

## 2022-05-12 DIAGNOSIS — I10 Essential (primary) hypertension: Secondary | ICD-10-CM | POA: Diagnosis not present

## 2022-05-12 DIAGNOSIS — E7800 Pure hypercholesterolemia, unspecified: Secondary | ICD-10-CM | POA: Diagnosis not present

## 2022-05-19 DIAGNOSIS — K219 Gastro-esophageal reflux disease without esophagitis: Secondary | ICD-10-CM | POA: Diagnosis not present

## 2022-05-19 DIAGNOSIS — Z800 Family history of malignant neoplasm of digestive organs: Secondary | ICD-10-CM | POA: Diagnosis not present

## 2022-05-19 DIAGNOSIS — K582 Mixed irritable bowel syndrome: Secondary | ICD-10-CM | POA: Diagnosis not present

## 2022-05-19 DIAGNOSIS — K5730 Diverticulosis of large intestine without perforation or abscess without bleeding: Secondary | ICD-10-CM | POA: Diagnosis not present

## 2022-05-26 DIAGNOSIS — M7989 Other specified soft tissue disorders: Secondary | ICD-10-CM | POA: Diagnosis not present

## 2022-05-26 DIAGNOSIS — I83891 Varicose veins of right lower extremities with other complications: Secondary | ICD-10-CM | POA: Diagnosis not present

## 2022-05-26 DIAGNOSIS — I83811 Varicose veins of right lower extremities with pain: Secondary | ICD-10-CM | POA: Diagnosis not present

## 2022-06-10 NOTE — Progress Notes (Unsigned)
Office Visit Note  Patient: Carmen Woods             Date of Birth: 1956-05-23           MRN: [id number]             PCP: Glenda Chroman, MD Referring: Glenda Chroman, MD Visit Date: 06/24/2022 Occupation: '@GUAROCC'$ @  Subjective:  Rash   History of Present Illness: Carmen Woods is a 66 y.o. female with history of dermatomyositis, sjogren's syndrome, and osteoarthritis.  She is taking imuran 100 mg by mouth daily.  Patient continues to tolerate Imuran without any side effects.  She denies any signs or symptoms of a dermatomyositis flare.  She has not had any increased muscle weakness or difficulty raising her arms above her head or rising from a seated position.  She has been under the care of dermatology specialist, Marella Chimes, PA-C recently for management of several basal cell carcinomas as well as a recent rash on her chest.  Her most recent office visit was yesterday on 06/23/2022.  She had several lesions concerning for basal cell carcinoma removed yesterday.  She was also given a prescription for hydrocortisone cream 2.5% to apply to the rash on her chest.  She is not currently taking systemic prednisone.  According to the patient her dermatologist feels that it is either a drug reaction or reaction to sun exposure.  Last week she was at the beach and wear sunscreen SPF 30 as well as remained under an umbrella.  She plans on using the hydrocortisone cream as prescribed and will be following up with her dermatologist soon.  Patient continues to have chronic pain and stiffness in her hands and both knee joints.  She denies any joint swelling at this time.  She has noticed decreased grip strength and stiffness especially first thing in the morning.  She states her pain is most severe in the right knee.  She has tried Voltaren gel with no relief.  She has been using kind of gel recently which has been helpful.  She avoids the use of NSAIDs due to history of gastric ulcer. Patient  continues to have chronic sicca symptoms.  She has been seeing [facility] once yearly and her dentist every 6 months as advised.  She has been using Biotene products as well as drinking water throughout the day to help with mouth dryness.  She has also been using soothe eyedrops for dry eyes which has been helpful.  She denies any parotid swelling or tenderness. Patient reports that she has been having difficulty sleeping at night.  She has sleep onset insomnia.  She has tried melatonin but continues to have difficulty sleeping at night.  She does not wake up feeling restored in the morning. She denies any recent or recurrent infections.  Activities of Daily Living:  Patient reports morning stiffness for several hours.   Patient Denies nocturnal pain.  Difficulty dressing/grooming: Denies Difficulty climbing stairs: Reports Difficulty getting out of chair: Reports Difficulty using hands for taps, buttons, cutlery, and/or writing: Reports  Review of Systems  Constitutional:  Positive for fatigue.  HENT:  Positive for mouth dryness. Negative for mouth sores.   Eyes:  Positive for dryness.  Respiratory:  Positive for shortness of breath.   Cardiovascular:  Positive for chest pain. Negative for palpitations.  Gastrointestinal:  Positive for constipation and diarrhea. Negative for blood in stool.  Endocrine: Negative for increased urination.  Genitourinary:  Negative for  involuntary urination.  Musculoskeletal:  Positive for joint pain, joint pain, myalgias, muscle weakness, morning stiffness, muscle tenderness and myalgias. Negative for gait problem and joint swelling.  Skin:  Positive for rash, hair loss and sensitivity to sunlight. Negative for color change.  Allergic/Immunologic: Negative for susceptible to infections.  Neurological:  Positive for dizziness and headaches.  Hematological:  Positive for swollen glands.  Psychiatric/Behavioral:  Positive for depressed mood and sleep  disturbance. The patient is nervous/anxious.     PMFS History:  Patient Active Problem List   Diagnosis Date Noted   Basal cell carcinoma (BCC) 08/25/2017   Primary osteoarthritis of both hands 03/18/2017   Primary osteoarthritis of both feet 03/18/2017   High risk medication use 11/10/2016   Galtron's Papules 11/10/2016   Other fatigue 11/10/2016   Spondylosis of lumbar region without myelopathy or radiculopathy 11/10/2016   History of anxiety 11/10/2016   Vitamin D deficiency 11/10/2016   History of vertigo 11/10/2016   Sjogren's syndrome with keratoconjunctivitis sicca ([place]) 11/10/2016   History of hypothyroidism 11/10/2016   Dermatomyositis ([place]) 01/07/2015   Mitral regurgitation 01/19/2013   Precordial pain 01/19/2013    Past Medical History:  Diagnosis Date   Basal cell carcinoma    DDD (degenerative disc disease), lumbar    Dermatomyositis ([place]) 12/21/2014   Difficulty swallowing    Dry eyes    Dry mouth    Hypothyroidism    Mass of left thigh 12/21/2014   Rash    hands, left thigh    Family History  Problem Relation Age of Onset   Heart Problems Mother        Pacermaker   Breast cancer Mother    Diabetes Father    Osteoarthritis Sister    Kidney disease Sister 83   Rheum arthritis Sister    Thyroid disease Sister    Breast cancer Cousin        Maternal cousin   Past Surgical History:  Procedure Laterality Date   BACK SURGERY  1998   BASAL CELL CARCINOMA EXCISION  2023   BELPHAROPTOSIS REPAIR Bilateral 11/2014   MUSCLE BIOPSY Left 01/07/2015   Procedure: LEFT THIGH MUSCLE BIOPSY;  Surgeon: Fanny Skates, MD;  Location: [facility];  Service: General;  Laterality: Left;   SKIN BIOPSY  06/23/2022   5 places removed per patient   skin cancer removal Right 2020   hand   skin cancer removal   07/2017   Social History   Social History Narrative   Not on file   Immunization History  Administered Date(s) Administered    Influenza,inj,Quad PF,6+ Mos 09/29/2018   Influenza,inj,quad, With Preservative 07/12/2017   Moderna Sars-Covid-2 Vaccination 11/30/2019, 12/27/2019     Objective: Vital Signs: BP (!) 158/87 (BP Location: Left Arm, Patient Position: Sitting, Cuff Size: Normal)   Pulse 63   Ht '5\' 7"'$  (1.702 m)   Wt 182 lb 3.2 oz (82.6 kg)   BMI 28.54 kg/m    Physical Exam Vitals and nursing note reviewed.  Constitutional:      Appearance: She is well-developed.  HENT:     Head: Normocephalic and atraumatic.  Eyes:     Conjunctiva/sclera: Conjunctivae normal.  Cardiovascular:     Rate and Rhythm: Normal rate and regular rhythm.     Heart sounds: Normal heart sounds.  Pulmonary:     Effort: Pulmonary effort is normal.     Breath sounds: Normal breath sounds.  Abdominal:     General: Bowel sounds are  normal.     Palpations: Abdomen is soft.  Musculoskeletal:     Cervical back: Normal range of motion.  Lymphadenopathy:     Cervical: No cervical adenopathy.  Skin:    General: Skin is warm and dry.     Capillary Refill: Capillary refill takes less than 2 seconds.  Neurological:     Mental Status: She is alert and oriented to person, place, and time.  Psychiatric:        Behavior: Behavior normal.       Musculoskeletal Exam: Full strength of upper and lower extremities noted.  C-spine has good range of motion with no discomfort.  No midline spinal tenderness or SI joint tenderness.  Shoulder joints, elbow joints, and wrist joints have good range of motion with no synovitis.  She has PIP and DIP thickening consistent with osteoarthritis of both hands.  Incomplete fist formation due to osteoarthritic changes.  Hip joints have good range of motion with no groin pain.  Slightly limited extension and crepitus noted in the right knee.  No warmth or effusion of knee joints noted.  Ankle joints have good range of motion with no tenderness or joint swelling.  CDAI Exam: CDAI Score: -- Patient Global:  --; Provider Global: -- Swollen: --; Tender: -- Joint Exam 06/24/2022   No joint exam has been documented for this visit   There is currently no information documented on the homunculus. Go to the Rheumatology activity and complete the homunculus joint exam.  Investigation: No additional findings.  Imaging: No results found.  Recent Labs: Lab Results  Component Value Date   WBC 5.0 03/11/2022   HGB 12.9 03/11/2022   PLT 225 03/11/2022   NA 140 03/11/2022   K 4.4 03/11/2022   CL 106 03/11/2022   CO2 29 03/11/2022   GLUCOSE 101 (H) 03/11/2022   BUN 8 03/11/2022   CREATININE 0.71 03/11/2022   BILITOT 0.6 03/11/2022   ALKPHOS 99 03/22/2017   AST 22 03/11/2022   ALT 12 03/11/2022   PROT 6.8 03/11/2022   ALBUMIN 4.3 03/22/2017   CALCIUM 9.5 03/11/2022   GFRAA 108 03/18/2021    Speciality Comments: No specialty comments available.  Procedures:  No procedures performed Allergies: Codeine and Hydrocodone      Assessment / Plan:     Visit Diagnoses: Dermatomyositis ([place]) - Positive biopsy May 2016, elevated CK and aldolase, Gottron's papules, muscle weakness, fatigue, positive Ro 52, positive anti-chromatin: She has not had any signs or symptoms of a dermatomyositis flare.  She has clinically been doing well taking Imuran 100 mg daily.  She continues to tolerate Imuran without any side effects and has not missed any doses recently.  She has full strength of upper and lower extremities on examination today.  No difficulty raising her arms above her head or rising from a seated position.  CK was 84 on 03/11/2022.  CK will be checked today with routine lab work. She is under the care of dermatology specialist at [facility].  No Gottron's papules noted on examination today. She will remain on Imuran as prescribed.  We will notify her of lab results.  She was advised to notify us if she develops any new or worsening symptoms.  She will follow-up in the office in 3 months or sooner if  needed.- Plan: CK, azaTHIOprine (IMURAN) 50 MG tablet  High risk medication use - Imuran 100 mg by mouth daily.  CBC and CMP drawn on 03/11/2022.  Results were reviewed with the  patient today in the office.  CBC and CMP will be updated today.  She will continue to require updated lab work every 3 months. She has not had any recent or recurrent infections.- Plan: COMPLETE METABOLIC PANEL WITH GFR, CBC with Differential/Platelet  Sjogren's syndrome with keratoconjunctivitis sicca (HCC) - +ANA, +Ro: Patient continues to have chronic sicca symptoms.  Her mouth dryness has been most severe.  She has been using Biotene products as well as trying to drink water throughout the day for symptomatic relief.  She continues to see the dentist every 6 months and has an upcoming appointment scheduled.  For eye dryness she has been using soothe eyedrops and has been following up closely with Dr. Katy Fitch.   Lungs were clear to auscultation today. Discussed the use of a humidifier especially during the fall and winter months.  I also discussed the 4-14 fold increased risk for developing lymphoma in patients with Sjogren's syndrome. SPEP, sed rate, and CBC with differential updated today along with the following lab work for further evaluation. She will remain on Imuran as prescribed.  She was advised to notify us if she develops any new or worsening symptoms. plan: Serum protein electrophoresis with reflex, Rheumatoid factor, Sjogrens syndrome-A extractable nuclear antibody, Sjogrens syndrome-B extractable nuclear antibody, ANA, C3 and C4, Sedimentation rate  Primary osteoarthritis of both hands - Clinical findings are consistent with osteoarthritis.  She has PIP and DIP thickening consistent with osteoarthritis of both hands.  Incomplete fist formation noted.  She is been experiencing increased stiffness and decreased grip strength first thing in the mornings.  Different treatment options were discussed.  Discussed the  importance of joint protection and muscle strengthening.  She was encouraged perform hand exercises daily.  Primary osteoarthritis of both knees: She is been experiencing increased pain in both knee joints, right greater than left.  She experiences increased pain and stiffness after sitting for long periods of time as well as when climbing steps.  On examination she has slightly limited extension and warmth in the right knee but no effusion noted.  Crepitus noted in the right knee joint on examination today.  Different treatment options were discussed today in detail.  Offered to update x-rays of both knees but she declined at this time.  I also discussed the use of cortisone as well as Visco gel injections but she would like to hold off at this time.  She has tried using Voltaren gel with no relief.  I also offered a referral to physical therapy for lower extremity muscle strengthening, fall prevention, and for osteoarthritis of both knees but she would like to hold off at this time.  She plans on trying home exercises.  She was given a handout of exercises to perform.  Primary osteoarthritis of both feet: She is not experiencing any increased discomfort in her feet at this time.  She has good range of motion of both ankle joints with no tenderness or synovitis.  Spondylosis of lumbar region without myelopathy or radiculopathy: No midline spinal tenderness at this time.  No symptoms of radiculopathy.  Vitamin D deficiency: Vitamin D was 70 on 10/09/2021.  Rash - Patient presents today with a maculopapular rash on the anterior surface of her chest.  The rash started on 06/15/2022 after being at the beach.  According to the patient she wear SPF 30 as well as that 100 umbrella.  It is unclear if the rash is a reaction to sun exposure or the sunscreen used.  She  was evaluated by dermatology yesterday on 06/23/2022.  She was given a prescription for hydrocortisone 2.5% which she plans to apply twice daily for 5  to 7 days.  She will be following up with her dermatologist for further evaluation.  Basal cell carcinoma (BCC) of overlapping sites of skin: Followed by dermatology specialists of [facility].  Most recent office visit on 06/23/2022-underwent C&E for several lesions and blade biopsies.  Awaiting biopsy results.   Other insomnia: Patient has been having difficulty sleeping at night.  Her biggest issue is with sleep onset.  Once she is asleep she is able to stay asleep.  She has been waking up in the morning not feeling restored since she has been tossing turning at night.  It has been more difficult for her to sleep at night since being a widow.  She has tried taking melatonin but did not notice any improvement in her sleep cycle.  In the past she tried Ambien but discontinued due to experiencing symptoms of grogginess in the morning. Discussed the importance of good sleep hygiene today in detail.  Several good sleep hygiene habits were discussed including trying to go to sleep and waking up at the same time daily as well as dimming the lights in the evenings.  I also discussed the importance of avoiding bluelight at least 30 to 60 minutes prior to bedtime.  Discussed the use of sleepy time tea as well as natural essential oils with calming effects. Different treatment options were discussed today in detail as well.  She plans on trying trazodone 50 mg 1 tablet at bedtime for insomnia.  Indications, contraindications, potential side effects of trazodone were discussed today in detail.  All questions were addressed.  She will remain on low-dose trazodone to minimize side effects as well as due to current use with Lexapro.  She will notify us if she continues to have difficulty sleeping at night or has any side effects while taking trazodone.  Other medical conditions are listed as follows:  History of hypothyroidism  History of anxiety: She remains on Lexapro as prescribed.  Orders: Orders Placed This  Encounter  Procedures   COMPLETE METABOLIC PANEL WITH GFR   CBC with Differential/Platelet   Serum protein electrophoresis with reflex   Rheumatoid factor   Sjogrens syndrome-A extractable nuclear antibody   Sjogrens syndrome-B extractable nuclear antibody   ANA   C3 and C4   Sedimentation rate   CK   Meds ordered this encounter  Medications   azaTHIOprine (IMURAN) 50 MG tablet    Sig: Take 2 tablets (100 mg total) by mouth daily.    Dispense:  180 tablet    Refill:  0   traZODone (DESYREL) 50 MG tablet    Sig: Take 1 tablet (50 mg total) by mouth at bedtime.    Dispense:  30 tablet    Refill:  0     Follow-Up Instructions: Return in 3 months (on 09/24/2022) for Dermatomyositis, Sjogren's syndrome, Osteoarthritis.   Ofilia Neas, PA-C  Note - This record has been created using Dragon software.  Chart creation errors have been sought, but may not always  have been located. Such creation errors do not reflect on  the standard of medical care.

## 2022-06-23 DIAGNOSIS — C4491 Basal cell carcinoma of skin, unspecified: Secondary | ICD-10-CM | POA: Diagnosis not present

## 2022-06-23 DIAGNOSIS — C44619 Basal cell carcinoma of skin of left upper limb, including shoulder: Secondary | ICD-10-CM | POA: Diagnosis not present

## 2022-06-23 DIAGNOSIS — C44612 Basal cell carcinoma of skin of right upper limb, including shoulder: Secondary | ICD-10-CM | POA: Diagnosis not present

## 2022-06-23 DIAGNOSIS — D485 Neoplasm of uncertain behavior of skin: Secondary | ICD-10-CM | POA: Diagnosis not present

## 2022-06-23 HISTORY — PX: SKIN BIOPSY: SHX1

## 2022-06-24 ENCOUNTER — Ambulatory Visit: Payer: Medicare Other | Attending: Physician Assistant | Admitting: Physician Assistant

## 2022-06-24 ENCOUNTER — Encounter: Payer: Self-pay | Admitting: Physician Assistant

## 2022-06-24 VITALS — BP 158/87 | HR 63 | Ht 67.0 in | Wt 182.2 lb

## 2022-06-24 DIAGNOSIS — M3501 Sicca syndrome with keratoconjunctivitis: Secondary | ICD-10-CM | POA: Diagnosis not present

## 2022-06-24 DIAGNOSIS — M19041 Primary osteoarthritis, right hand: Secondary | ICD-10-CM | POA: Diagnosis not present

## 2022-06-24 DIAGNOSIS — M19071 Primary osteoarthritis, right ankle and foot: Secondary | ICD-10-CM | POA: Diagnosis not present

## 2022-06-24 DIAGNOSIS — R21 Rash and other nonspecific skin eruption: Secondary | ICD-10-CM | POA: Diagnosis not present

## 2022-06-24 DIAGNOSIS — I83892 Varicose veins of left lower extremities with other complications: Secondary | ICD-10-CM | POA: Diagnosis not present

## 2022-06-24 DIAGNOSIS — C4481 Basal cell carcinoma of overlapping sites of skin: Secondary | ICD-10-CM

## 2022-06-24 DIAGNOSIS — M3390 Dermatopolymyositis, unspecified, organ involvement unspecified: Secondary | ICD-10-CM

## 2022-06-24 DIAGNOSIS — Z8659 Personal history of other mental and behavioral disorders: Secondary | ICD-10-CM

## 2022-06-24 DIAGNOSIS — Z8639 Personal history of other endocrine, nutritional and metabolic disease: Secondary | ICD-10-CM | POA: Diagnosis not present

## 2022-06-24 DIAGNOSIS — M19072 Primary osteoarthritis, left ankle and foot: Secondary | ICD-10-CM

## 2022-06-24 DIAGNOSIS — M19042 Primary osteoarthritis, left hand: Secondary | ICD-10-CM

## 2022-06-24 DIAGNOSIS — G4709 Other insomnia: Secondary | ICD-10-CM

## 2022-06-24 DIAGNOSIS — Z79899 Other long term (current) drug therapy: Secondary | ICD-10-CM

## 2022-06-24 DIAGNOSIS — M47816 Spondylosis without myelopathy or radiculopathy, lumbar region: Secondary | ICD-10-CM | POA: Diagnosis not present

## 2022-06-24 DIAGNOSIS — M170 Bilateral primary osteoarthritis of knee: Secondary | ICD-10-CM

## 2022-06-24 DIAGNOSIS — E559 Vitamin D deficiency, unspecified: Secondary | ICD-10-CM | POA: Diagnosis not present

## 2022-06-24 MED ORDER — AZATHIOPRINE 50 MG PO TABS: 100.0000 mg | ORAL_TABLET | Freq: Every day | ORAL | 0 refills | Status: DC

## 2022-06-24 MED ORDER — TRAZODONE HCL 50 MG PO TABS: 50.0000 mg | ORAL_TABLET | Freq: Every day | ORAL | 0 refills | Status: DC

## 2022-06-24 NOTE — Patient Instructions (Signed)
Standing Labs We placed an order today for your standing lab work.   Please have your standing labs drawn in January and every 3 months  Please have your labs drawn 2 weeks prior to your appointment so that the provider can discuss your lab results at your appointment.  Please note that you may see your imaging and lab results in [facility] before we have reviewed them. We will contact you once all results are reviewed. Please allow our office up to 72 hours to thoroughly review all of the results before contacting the office for clarification of your results.  Lab hours are: Monday through Thursday from 1:30 pm-4:30 pm and Friday from 1:30 pm- 4:00 pm  You may experience shorter wait times on Monday, Thursday or Friday afternoons,.   Effective July 20, 2022, new lab hours will be: Monday through Thursday from 8:00 am -12:30 pm and 1:00 pm-5:00 pm and Friday from 8:00 am-12:00 pm.  Please be advised, all patients with office appointments requiring lab work will take precedent over walk-in lab work.   Labs are drawn by [facility]. Please bring your co-pay at the time of your lab draw.  You may receive a bill from [facility] for your lab work.  Please note if you are on Hydroxychloroquine and and an order has been placed for a Hydroxychloroquine level, you will need to have it drawn 4 hours or more after your last dose.  If you wish to have your labs drawn at another location, please call the office 24 hours in advance so we can fax the orders.  The office is located at [address] [place], [place], [place] [address] No appointment is necessary.    If you have any questions regarding directions or hours of operation,  please call [phone].   As a reminder, please drink plenty of water prior to coming for your lab work. Thanks!    Exercises for Chronic Knee Pain Chronic knee pain is pain that lasts longer than 3 months. For most people with chronic knee pain, exercise and weight loss  is an important part of treatment. Your health care provider may want you to focus on: Strengthening the muscles that support your knee. This can take pressure off your knee and lessen pain. Preventing knee stiffness. Maintaining or increasing how far you can move your knee. Losing weight (if this applies) to take pressure off your knee, decrease your risk for injury, and make it easier for you to exercise. Your health care provider will help you develop an exercise program that matches your needs and physical abilities. Below are simple, low-impact exercises you can do at home. Ask your health care provider or a physical therapist how often you should do your exercise program and how many times to repeat each exercise. General safety tips Follow these safety tips for exercising with chronic knee pain: Get your health care provider's approval before doing any exercises. Start slowly and stop any time an exercise causes pain. Do not exercise if your knee pain is flaring up. Warm up first. Stretching a cold muscle can cause an injury. Do 5-10 minutes of easy movement or light stretching before beginning your exercise routine. Do 5-10 minutes of low-impact activity (like walking or cycling) before starting strengthening exercises. Contact your health care provider any time you have pain during or after exercising. Exercise may cause discomfort but should not be painful. It is normal to be a little stiff or sore after exercising.  Stretching and range-of-motion  exercises Front thigh stretch  Stand up straight and support your body by holding on to a chair or resting one hand on a wall. With your legs straight and close together, bend one knee to lift your heel up toward your buttocks. Using one hand for support, grab your ankle with your free hand. Pull your foot up closer toward your buttocks to feel the stretch in front of your thigh. Hold the stretch for 30 seconds. Repeat __________ times.  Complete this exercise __________ times a day. Back thigh stretch  Sit on the floor with your back straight and your legs out straight in front of you. Place the palms of your hands on the floor and slide them toward your feet as you bend at the hip. Try to touch your nose to your knees and feel the stretch in the back of your thighs. Hold for 30 seconds. Repeat __________ times. Complete this exercise __________ times a day. Calf stretch  Stand facing a wall. Place the palms of your hands flat against the wall, arms extended, and lean slightly against the wall. Get into a lunge position with one leg bent at the knee and the other leg stretched out straight behind you. Keep both feet facing the wall and increase the bend in your knee while keeping the heel of the other leg flat on the ground. You should feel the stretch in your calf. Hold for 30 seconds. Repeat __________ times. Complete this exercise __________ times a day. Strengthening exercises Straight leg lift Lie on your back with one knee bent and the other leg out straight. Slowly lift the straight leg without bending the knee. Lift until your foot is about 12 inches (30 cm) off the floor. Hold for 3-5 seconds and slowly lower your leg. Repeat __________ times. Complete this exercise __________ times a day. Single leg dip Stand between two chairs and put both hands on the backs of the chairs for support. Extend one leg out straight with your body weight resting on the heel of the standing leg. Slowly bend your standing knee to dip your body to the level that is comfortable for you. Hold for 3-5 seconds. Repeat __________ times. Complete this exercise __________ times a day. Hamstring curls Stand straight, knees close together, facing the back of a chair. Hold on to the back of a chair with both hands. Keep one leg straight. Bend the other knee while bringing the heel up toward the buttock until the knee is bent at a 90-degree  angle (right angle). Hold for 3-5 seconds. Repeat __________ times. Complete this exercise __________ times a day. Wall squat Stand straight with your back, hips, and head against a wall. Step forward one foot at a time with your back still against the wall. Your feet should be 2 feet (61 cm) from the wall at shoulder width. Keeping your back, hips, and head against the wall, slide down the wall to as close of a sitting position as you can get. Hold for 5-10 seconds, then slowly slide back up. Repeat __________ times. Complete this exercise __________ times a day. Step-ups Step up with one foot onto a sturdy platform or stool that is about 6 inches (15 cm) high. Face sideways with one foot on the platform and one on the ground. Place all your weight on the platform foot and lift your body off the ground until your knee extends. Let your other leg hang free to the side. Hold for 3-5 seconds then slowly  lower your weight down to the floor foot. Repeat __________ times. Complete this exercise __________ times a day. Contact a health care provider if: Your exercise causes pain. Your pain is worse after you exercise. Your pain prevents you from doing your exercises. This information is not intended to replace advice given to you by your health care provider. Make sure you discuss any questions you have with your health care provider. Document Revised: 01/11/2020 Document Reviewed: 09/04/2019 Elsevier Patient Education  [place].

## 2022-06-25 NOTE — Progress Notes (Signed)
CBC and CMP WNL.  ESR WNL. RF negative.  CK WNL.   Complements WNL.

## 2022-06-25 NOTE — Progress Notes (Signed)
Ro antibody remains positive.  La negative.

## 2022-06-26 LAB — CBC WITH DIFFERENTIAL/PLATELET
Absolute Monocytes: 358 cells/uL (ref 200–950)
Basophils Absolute: 49 cells/uL (ref 0–200)
Basophils Relative: 1 %
Eosinophils Absolute: 162 cells/uL (ref 15–500)
Eosinophils Relative: 3.3 %
HCT: 39.3 % (ref 35.0–45.0)
Hemoglobin: 13.3 g/dL (ref 11.7–15.5)
Lymphs Abs: 892 cells/uL (ref 850–3900)
MCH: 30 pg (ref 27.0–33.0)
MCHC: 33.8 g/dL (ref 32.0–36.0)
MCV: 88.5 fL (ref 80.0–100.0)
MPV: 10.2 fL (ref 7.5–12.5)
Monocytes Relative: 7.3 %
Neutro Abs: 3440 cells/uL (ref 1500–7800)
Neutrophils Relative %: 70.2 %
Platelets: 232 10*3/uL (ref 140–400)
RBC: 4.44 10*6/uL (ref 3.80–5.10)
RDW: 12.5 % (ref 11.0–15.0)
Total Lymphocyte: 18.2 %
WBC: 4.9 10*3/uL (ref 3.8–10.8)

## 2022-06-26 LAB — PROTEIN ELECTROPHORESIS, SERUM, WITH REFLEX
Albumin ELP: 4.1 g/dL (ref 3.8–4.8)
Alpha 1: 0.3 g/dL (ref 0.2–0.3)
Alpha 2: 0.6 g/dL (ref 0.5–0.9)
Beta 2: 0.3 g/dL (ref 0.2–0.5)
Beta Globulin: 0.5 g/dL (ref 0.4–0.6)
Gamma Globulin: 1 g/dL (ref 0.8–1.7)
Total Protein: 6.9 g/dL (ref 6.1–8.1)

## 2022-06-26 LAB — COMPLETE METABOLIC PANEL WITH GFR
AG Ratio: 1.8 (calc) (ref 1.0–2.5)
ALT: 13 U/L (ref 6–29)
AST: 20 U/L (ref 10–35)
Albumin: 4.6 g/dL (ref 3.6–5.1)
Alkaline phosphatase (APISO): 81 U/L (ref 37–153)
BUN: 10 mg/dL (ref 7–25)
CO2: 28 mmol/L (ref 20–32)
Calcium: 9.8 mg/dL (ref 8.6–10.4)
Chloride: 104 mmol/L (ref 98–110)
Creat: 0.7 mg/dL (ref 0.50–1.05)
Globulin: 2.5 g/dL (calc) (ref 1.9–3.7)
Glucose, Bld: 95 mg/dL (ref 65–99)
Potassium: 4.7 mmol/L (ref 3.5–5.3)
Sodium: 141 mmol/L (ref 135–146)
Total Bilirubin: 0.5 mg/dL (ref 0.2–1.2)
Total Protein: 7.1 g/dL (ref 6.1–8.1)
eGFR: 96 mL/min/{1.73_m2} (ref >=60)

## 2022-06-26 LAB — SJOGRENS SYNDROME-B EXTRACTABLE NUCLEAR ANTIBODY: SSB (La) (ENA) Antibody, IgG: <1 AI

## 2022-06-26 LAB — C3 AND C4
C3 Complement: 173 mg/dL (ref 83–193)
C4 Complement: 32 mg/dL (ref 15–57)

## 2022-06-26 LAB — ANTI-NUCLEAR AB-TITER (ANA TITER)
ANA TITER: 1:320 {titer} — ABNORMAL HIGH
ANA Titer 1: 1:80 {titer} — ABNORMAL HIGH

## 2022-06-26 LAB — RHEUMATOID FACTOR: Rheumatoid fact SerPl-aCnc: <14 IU/mL (ref <14)

## 2022-06-26 LAB — SEDIMENTATION RATE: Sed Rate: 14 mm/h (ref 0–30)

## 2022-06-26 LAB — ANA: Anti Nuclear Antibody (ANA): POSITIVE — AB

## 2022-06-26 LAB — SJOGRENS SYNDROME-A EXTRACTABLE NUCLEAR ANTIBODY: SSA (Ro) (ENA) Antibody, IgG: >8 AI — AB

## 2022-06-26 LAB — CK: Total CK: 90 U/L (ref 29–143)

## 2022-06-26 NOTE — Progress Notes (Signed)
ANA remains positive.   SPEP is pending.

## 2022-06-28 NOTE — Progress Notes (Signed)
SPEP did not reveal any abnormal protein bands.

## 2022-06-29 DIAGNOSIS — L905 Scar conditions and fibrosis of skin: Secondary | ICD-10-CM | POA: Diagnosis not present

## 2022-06-29 DIAGNOSIS — L570 Actinic keratosis: Secondary | ICD-10-CM | POA: Diagnosis not present

## 2022-06-29 DIAGNOSIS — C44619 Basal cell carcinoma of skin of left upper limb, including shoulder: Secondary | ICD-10-CM | POA: Diagnosis not present

## 2022-06-30 DIAGNOSIS — Z79899 Other long term (current) drug therapy: Secondary | ICD-10-CM | POA: Diagnosis not present

## 2022-06-30 DIAGNOSIS — M859 Disorder of bone density and structure, unspecified: Secondary | ICD-10-CM | POA: Diagnosis not present

## 2022-07-07 DIAGNOSIS — Z23 Encounter for immunization: Secondary | ICD-10-CM | POA: Diagnosis not present

## 2022-07-07 DIAGNOSIS — Z299 Encounter for prophylactic measures, unspecified: Secondary | ICD-10-CM | POA: Diagnosis not present

## 2022-07-07 DIAGNOSIS — Z789 Other specified health status: Secondary | ICD-10-CM | POA: Diagnosis not present

## 2022-07-07 DIAGNOSIS — R5383 Other fatigue: Secondary | ICD-10-CM | POA: Diagnosis not present

## 2022-07-07 DIAGNOSIS — E039 Hypothyroidism, unspecified: Secondary | ICD-10-CM | POA: Diagnosis not present

## 2022-07-15 DIAGNOSIS — I83891 Varicose veins of right lower extremities with other complications: Secondary | ICD-10-CM | POA: Diagnosis not present

## 2022-07-21 DIAGNOSIS — L570 Actinic keratosis: Secondary | ICD-10-CM | POA: Diagnosis not present

## 2022-07-21 DIAGNOSIS — L821 Other seborrheic keratosis: Secondary | ICD-10-CM | POA: Diagnosis not present

## 2022-07-21 DIAGNOSIS — L905 Scar conditions and fibrosis of skin: Secondary | ICD-10-CM | POA: Diagnosis not present

## 2022-07-21 DIAGNOSIS — C44612 Basal cell carcinoma of skin of right upper limb, including shoulder: Secondary | ICD-10-CM | POA: Diagnosis not present

## 2022-07-21 DIAGNOSIS — M3310 Other dermatopolymyositis, organ involvement unspecified: Secondary | ICD-10-CM | POA: Diagnosis not present

## 2022-07-21 DIAGNOSIS — C44619 Basal cell carcinoma of skin of left upper limb, including shoulder: Secondary | ICD-10-CM | POA: Diagnosis not present

## 2022-07-21 DIAGNOSIS — Z85828 Personal history of other malignant neoplasm of skin: Secondary | ICD-10-CM | POA: Diagnosis not present

## 2022-08-13 IMAGING — MG DIGITAL DIAGNOSTIC BILAT W/ TOMO W/ CAD
6 of 9 series · 6 of 25 positions shown · non-contrast
Comparison: Previous exam(s).

CLINICAL DATA: Patient describes focal pain in the LEFT axilla.
Patient describes a recent increase in physical activity.

EXAM:
DIGITAL DIAGNOSTIC BILATERAL MAMMOGRAM WITH TOMOSYNTHESIS AND CAD;
US AXILLARY LEFT
TECHNIQUE: Bilateral digital diagnostic mammography and breast tomosynthesis
was performed. The images were evaluated with computer-aided
detection.; Targeted ultrasound examination of the left axilla was
performed.

[R MLO synth-2D]
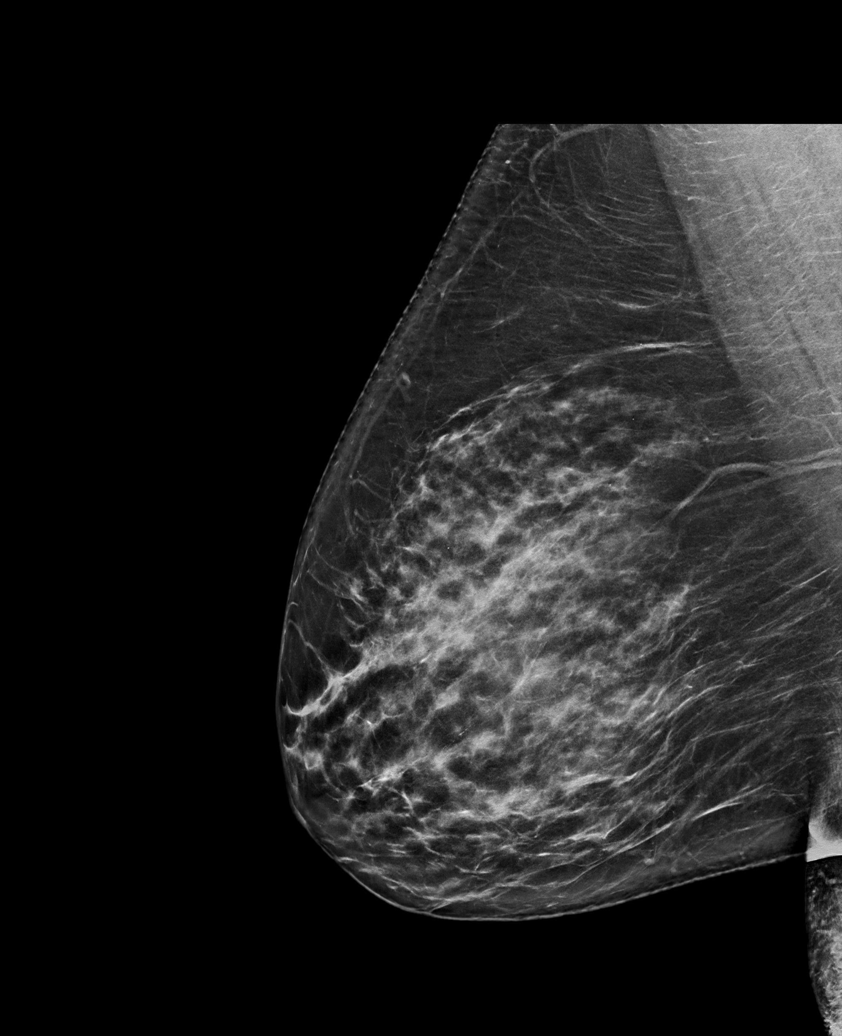

[L TAN synth-2D]
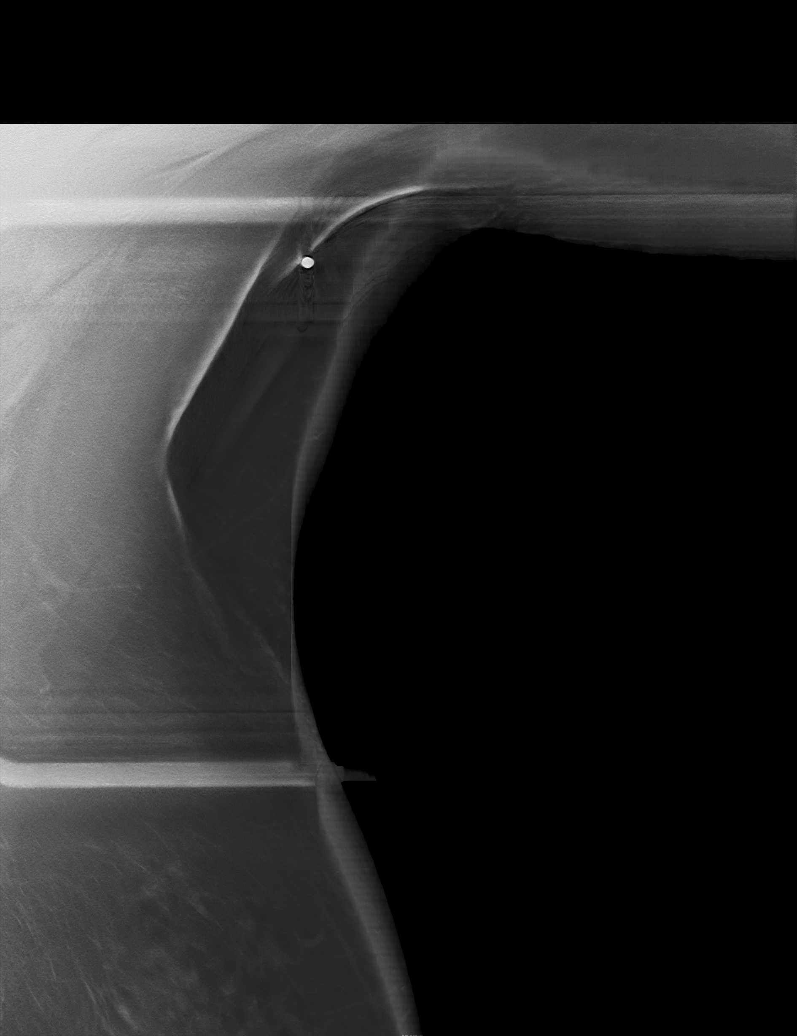

[R CC synth-2D]
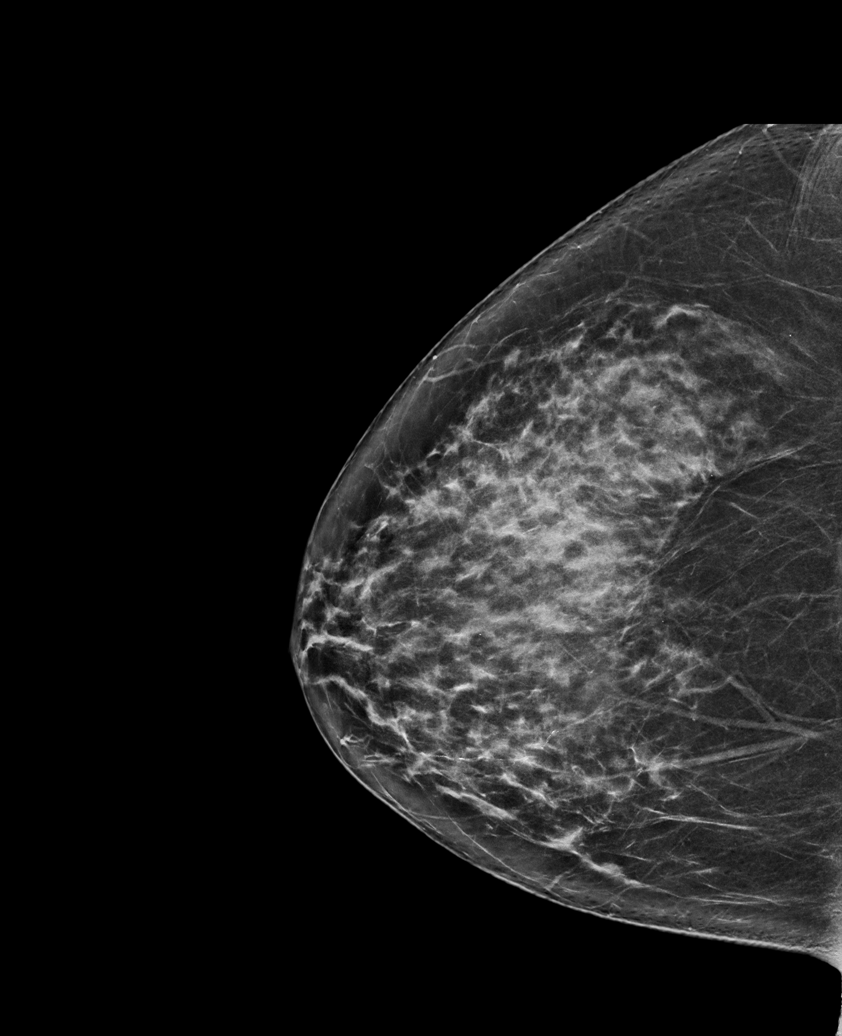

[L CC synth-2D]
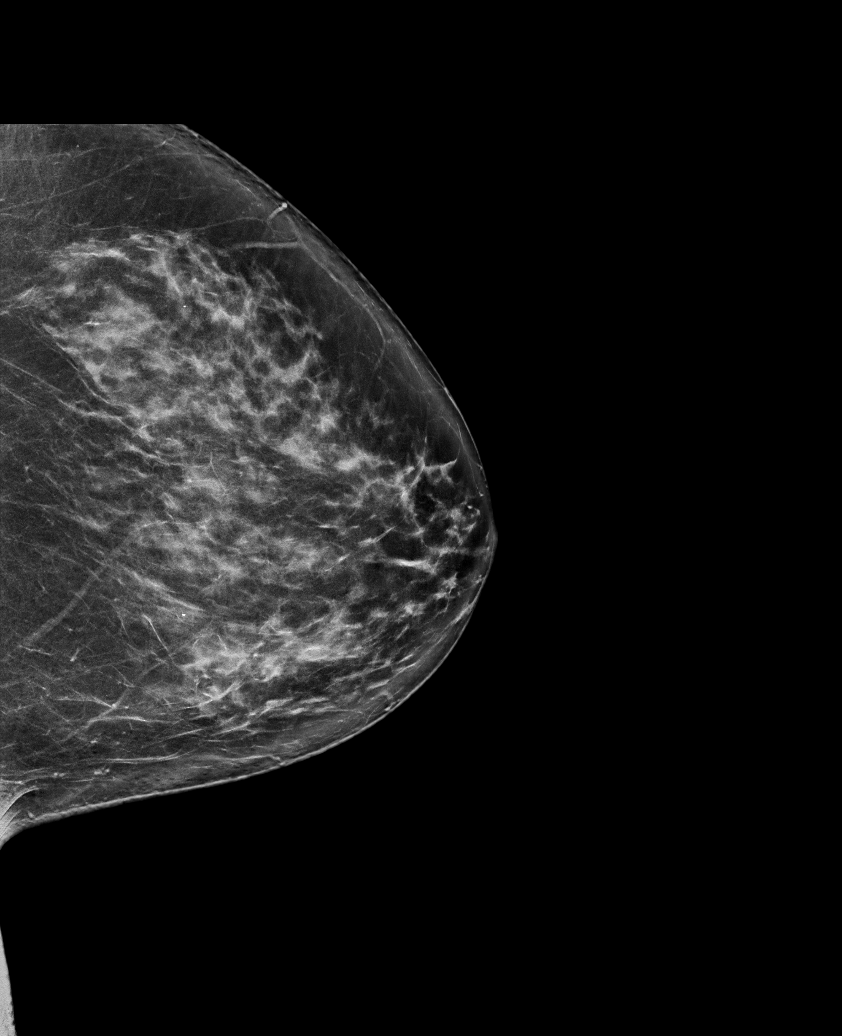

[L MLO synth-2D]
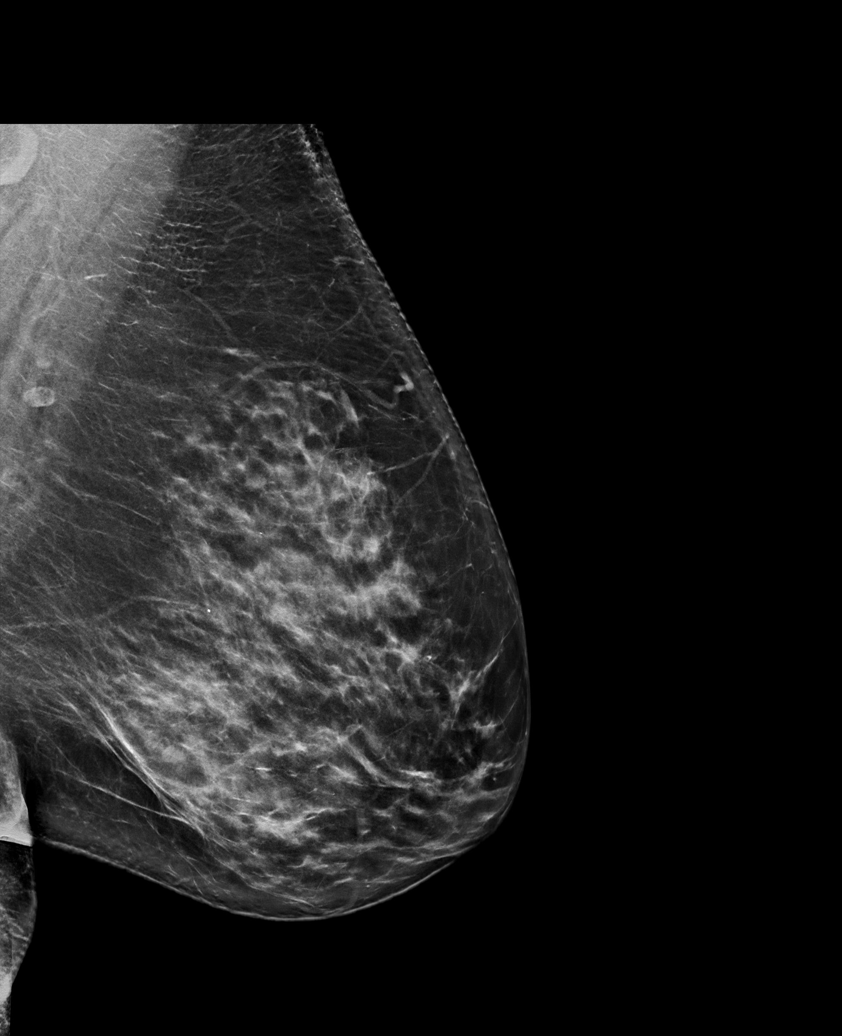

[L MLO tomo · tomo slice 43/84.0]
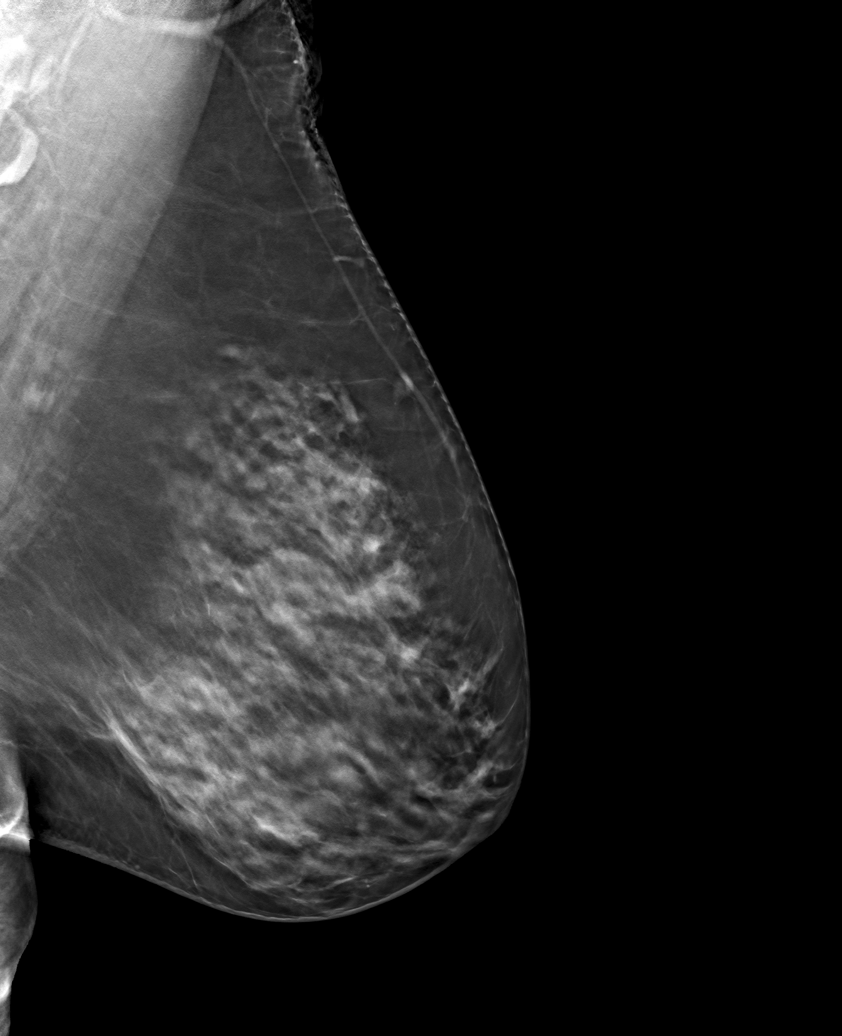

[6 of 25 positions shown; findings below may reference images not displayed]

ACR Breast Density Category c: The breast tissue is heterogeneously
dense, which may obscure small masses.
FINDINGS: There are no new dominant masses, suspicious calcifications or
secondary signs of malignancy within either breast. Also, there is
no mammographic abnormality identified within the visualized portion
of the LEFT axilla corresponding to the area of clinical concern.

On physical exam, I feel no fixed or circumscribed mass within the
LEFT axilla

Targeted ultrasound is performed, evaluating the LEFT axilla as
directed by the patient, showing only normal soft tissues. No solid
or cystic mass. No enlarged lymph nodes.
IMPRESSION: 1. No evidence of malignancy within either breast.
2. No evidence of malignancy or acute findings within the LEFT
axilla.

RECOMMENDATION:
1.  Screening mammogram in one year.(Code:MD-7-5H4)
2. Benign causes of breast and axillary pain were discussed with the
patient. The patient was encouraged to follow-up with referring
physician if the pain persisted or worsened as this might indicate a
need to evaluate the deeper structures of the underlying chest wall.

I have discussed the findings and recommendations with the patient.
If applicable, a reminder letter will be sent to the patient
regarding the next appointment.

BI-RADS CATEGORY  1: Negative.

## 2022-08-19 ENCOUNTER — Other Ambulatory Visit: Payer: Self-pay | Admitting: Physician Assistant

## 2022-08-19 DIAGNOSIS — M3390 Dermatopolymyositis, unspecified, organ involvement unspecified: Secondary | ICD-10-CM

## 2022-09-02 DIAGNOSIS — L570 Actinic keratosis: Secondary | ICD-10-CM | POA: Diagnosis not present

## 2022-09-02 DIAGNOSIS — C44612 Basal cell carcinoma of skin of right upper limb, including shoulder: Secondary | ICD-10-CM | POA: Diagnosis not present

## 2022-09-02 DIAGNOSIS — C44619 Basal cell carcinoma of skin of left upper limb, including shoulder: Secondary | ICD-10-CM | POA: Diagnosis not present

## 2022-09-23 ENCOUNTER — Other Ambulatory Visit: Payer: Self-pay | Admitting: *Deleted

## 2022-09-23 DIAGNOSIS — M3390 Dermatopolymyositis, unspecified, organ involvement unspecified: Secondary | ICD-10-CM

## 2022-09-23 MED ORDER — AZATHIOPRINE 50 MG PO TABS: 100.0000 mg | ORAL_TABLET | Freq: Every day | ORAL | 0 refills | Status: DC

## 2022-09-23 NOTE — Telephone Encounter (Signed)
Patient contacted the office requesting a refill on Imuran to be sent to [facility]. Patient advised she needs a follow up appointment. Scheduled patient for 09/29/2022.   Next Visit: 09/29/2022  Last Visit: 06/24/2022  Last Fill: 06/24/2022  DX: Dermatomyositis   Current Dose per office note 06/24/2022: Imuran 100 mg by mouth daily.   Labs: 06/24/2022 CBC and CMP WNL.   Patient to update labs at upcoming appointment.   Okay to refill Imuran?

## 2022-09-25 NOTE — Progress Notes (Signed)
Office Visit Note  Patient: Carmen Woods             Date of Birth: March 08, 1956           MRN: [id number]             PCP: Glenda Chroman, MD Referring: Glenda Chroman, MD Visit Date: 09/29/2022 Occupation: '@GUAROCC'$ @  Subjective:  No chief complaint on file.   History of Present Illness: Carmen Woods is a 67 y.o. female ***     Activities of Daily Living:  Patient reports morning stiffness for *** {minute/hour:19697}.   Patient {ACTIONS;DENIES/REPORTS:21021675::"Denies"} nocturnal pain.  Difficulty dressing/grooming: {ACTIONS;DENIES/REPORTS:21021675::"Denies"} Difficulty climbing stairs: {ACTIONS;DENIES/REPORTS:21021675::"Denies"} Difficulty getting out of chair: {ACTIONS;DENIES/REPORTS:21021675::"Denies"} Difficulty using hands for taps, buttons, cutlery, and/or writing: {ACTIONS;DENIES/REPORTS:21021675::"Denies"}  No Rheumatology ROS completed.   PMFS History:  Patient Active Problem List   Diagnosis Date Noted  . Basal cell carcinoma (BCC) 08/25/2017  . Primary osteoarthritis of both hands 03/18/2017  . Primary osteoarthritis of both feet 03/18/2017  . High risk medication use 11/10/2016  . Galtron's Papules 11/10/2016  . Other fatigue 11/10/2016  . Spondylosis of lumbar region without myelopathy or radiculopathy 11/10/2016  . History of anxiety 11/10/2016  . Vitamin D deficiency 11/10/2016  . History of vertigo 11/10/2016  . Sjogren's syndrome with keratoconjunctivitis sicca ([place]) 11/10/2016  . History of hypothyroidism 11/10/2016  . Dermatomyositis ([place]) 01/07/2015  . Mitral regurgitation 01/19/2013  . Precordial pain 01/19/2013    Past Medical History:  Diagnosis Date  . Basal cell carcinoma   . DDD (degenerative disc disease), lumbar   . Dermatomyositis ([place]) 12/21/2014  . Difficulty swallowing   . Dry eyes   . Dry mouth   . Hypothyroidism   . Mass of left thigh 12/21/2014  . Rash    hands, left thigh    Family History  Problem  Relation Age of Onset  . Heart Problems Mother        Pacermaker  . Breast cancer Mother   . Diabetes Father   . Osteoarthritis Sister   . Kidney disease Sister 36  . Rheum arthritis Sister   . Thyroid disease Sister   . Breast cancer Cousin        Maternal cousin   Past Surgical History:  Procedure Laterality Date  . BACK SURGERY  1998  . BASAL CELL CARCINOMA EXCISION  2023  . BELPHAROPTOSIS REPAIR Bilateral 11/2014  . MUSCLE BIOPSY Left 01/07/2015   Procedure: LEFT THIGH MUSCLE BIOPSY;  Surgeon: Fanny Skates, MD;  Location: [place];  Service: General;  Laterality: Left;  . SKIN BIOPSY  06/23/2022   5 places removed per patient  . skin cancer removal Right 2020   hand  . skin cancer removal   07/2017   Social History   Social History Narrative  . Not on file   Immunization History  Administered Date(s) Administered  . Influenza,inj,Quad PF,6+ Mos 09/29/2018  . Influenza,inj,quad, With Preservative 07/12/2017  . Moderna Sars-Covid-2 Vaccination 11/30/2019, 12/27/2019     Objective: Vital Signs: There were no vitals taken for this visit.   Physical Exam   Musculoskeletal Exam: ***  CDAI Exam: CDAI Score: -- Patient Global: --; Provider Global: -- Swollen: --; Tender: -- Joint Exam 09/29/2022   No joint exam has been documented for this visit   There is currently no information documented on the homunculus. Go to the Rheumatology activity and complete the homunculus joint exam.  Investigation: No additional findings.  Imaging: No results found.  Recent Labs: Lab Results  Component Value Date   WBC 4.9 06/24/2022   HGB 13.3 06/24/2022   PLT 232 06/24/2022   NA 141 06/24/2022   K 4.7 06/24/2022   CL 104 06/24/2022   CO2 28 06/24/2022   GLUCOSE 95 06/24/2022   BUN 10 06/24/2022   CREATININE 0.70 06/24/2022   BILITOT 0.5 06/24/2022   ALKPHOS 99 03/22/2017   AST 20 06/24/2022   ALT 13 06/24/2022   PROT 7.1 06/24/2022   PROT  6.9 06/24/2022   ALBUMIN 4.3 03/22/2017   CALCIUM 9.8 06/24/2022   GFRAA 108 03/18/2021    Speciality Comments: No specialty comments available.  Procedures:  No procedures performed Allergies: Codeine and Hydrocodone   Assessment / Plan:     Visit Diagnoses: No diagnosis found.  Orders: No orders of the defined types were placed in this encounter.  No orders of the defined types were placed in this encounter.   Face-to-face time spent with patient was *** minutes. Greater than 50% of time was spent in counseling and coordination of care.  Follow-Up Instructions: No follow-ups on file.   Earnestine Mealing, CMA  Note - This record has been created using Editor, commissioning.  Chart creation errors have been sought, but may not always  have been located. Such creation errors do not reflect on  the standard of medical care.

## 2022-09-29 ENCOUNTER — Ambulatory Visit: Payer: Medicare Other | Attending: Physician Assistant | Admitting: Physician Assistant

## 2022-09-29 ENCOUNTER — Encounter: Payer: Self-pay | Admitting: Physician Assistant

## 2022-09-29 VITALS — BP 179/82 | HR 53 | Resp 17 | Ht 67.0 in | Wt 179.8 lb

## 2022-09-29 DIAGNOSIS — M19042 Primary osteoarthritis, left hand: Secondary | ICD-10-CM

## 2022-09-29 DIAGNOSIS — M3501 Sicca syndrome with keratoconjunctivitis: Secondary | ICD-10-CM | POA: Diagnosis not present

## 2022-09-29 DIAGNOSIS — M3390 Dermatopolymyositis, unspecified, organ involvement unspecified: Secondary | ICD-10-CM

## 2022-09-29 DIAGNOSIS — M170 Bilateral primary osteoarthritis of knee: Secondary | ICD-10-CM

## 2022-09-29 DIAGNOSIS — E559 Vitamin D deficiency, unspecified: Secondary | ICD-10-CM | POA: Diagnosis not present

## 2022-09-29 DIAGNOSIS — C4481 Basal cell carcinoma of overlapping sites of skin: Secondary | ICD-10-CM | POA: Diagnosis not present

## 2022-09-29 DIAGNOSIS — G4709 Other insomnia: Secondary | ICD-10-CM

## 2022-09-29 DIAGNOSIS — R21 Rash and other nonspecific skin eruption: Secondary | ICD-10-CM

## 2022-09-29 DIAGNOSIS — Z8639 Personal history of other endocrine, nutritional and metabolic disease: Secondary | ICD-10-CM

## 2022-09-29 DIAGNOSIS — M47816 Spondylosis without myelopathy or radiculopathy, lumbar region: Secondary | ICD-10-CM

## 2022-09-29 DIAGNOSIS — M19071 Primary osteoarthritis, right ankle and foot: Secondary | ICD-10-CM | POA: Diagnosis not present

## 2022-09-29 DIAGNOSIS — M19041 Primary osteoarthritis, right hand: Secondary | ICD-10-CM

## 2022-09-29 DIAGNOSIS — Z79899 Other long term (current) drug therapy: Secondary | ICD-10-CM | POA: Diagnosis not present

## 2022-09-29 DIAGNOSIS — Z8659 Personal history of other mental and behavioral disorders: Secondary | ICD-10-CM

## 2022-09-29 DIAGNOSIS — M19072 Primary osteoarthritis, left ankle and foot: Secondary | ICD-10-CM

## 2022-09-29 MED ORDER — TRAZODONE HCL 50 MG PO TABS: 50.0000 mg | ORAL_TABLET | Freq: Every day | ORAL | 2 refills | Status: DC

## 2022-09-29 NOTE — Patient Instructions (Signed)

## 2022-09-30 LAB — CBC WITH DIFFERENTIAL/PLATELET
Absolute Monocytes: 464 cells/uL (ref 200–950)
Basophils Absolute: 49 cells/uL (ref 0–200)
Basophils Relative: 0.9 %
Eosinophils Absolute: 140 cells/uL (ref 15–500)
Eosinophils Relative: 2.6 %
HCT: 39.5 % (ref 35.0–45.0)
Hemoglobin: 13.4 g/dL (ref 11.7–15.5)
Lymphs Abs: 1139 cells/uL (ref 850–3900)
MCH: 29.1 pg (ref 27.0–33.0)
MCHC: 33.9 g/dL (ref 32.0–36.0)
MCV: 85.9 fL (ref 80.0–100.0)
MPV: 10.7 fL (ref 7.5–12.5)
Monocytes Relative: 8.6 %
Neutro Abs: 3607 cells/uL (ref 1500–7800)
Neutrophils Relative %: 66.8 %
Platelets: 236 10*3/uL (ref 140–400)
RBC: 4.6 10*6/uL (ref 3.80–5.10)
RDW: 12.5 % (ref 11.0–15.0)
Total Lymphocyte: 21.1 %
WBC: 5.4 10*3/uL (ref 3.8–10.8)

## 2022-09-30 LAB — COMPLETE METABOLIC PANEL WITH GFR
AG Ratio: 1.6 (calc) (ref 1.0–2.5)
ALT: 11 U/L (ref 6–29)
AST: 23 U/L (ref 10–35)
Albumin: 4.4 g/dL (ref 3.6–5.1)
Alkaline phosphatase (APISO): 85 U/L (ref 37–153)
BUN: 7 mg/dL (ref 7–25)
CO2: 29 mmol/L (ref 20–32)
Calcium: 9.7 mg/dL (ref 8.6–10.4)
Chloride: 106 mmol/L (ref 98–110)
Creat: 0.77 mg/dL (ref 0.50–1.05)
Globulin: 2.7 g/dL (calc) (ref 1.9–3.7)
Glucose, Bld: 98 mg/dL (ref 65–99)
Potassium: 4.5 mmol/L (ref 3.5–5.3)
Sodium: 141 mmol/L (ref 135–146)
Total Bilirubin: 0.7 mg/dL (ref 0.2–1.2)
Total Protein: 7.1 g/dL (ref 6.1–8.1)
eGFR: 85 mL/min/{1.73_m2} (ref >=60)

## 2022-09-30 LAB — CK: Total CK: 108 U/L (ref 29–143)

## 2022-09-30 NOTE — Progress Notes (Signed)
CBC and CMP WNL.  CK WNL.

## 2022-10-22 DIAGNOSIS — R1013 Epigastric pain: Secondary | ICD-10-CM | POA: Diagnosis not present

## 2022-10-22 DIAGNOSIS — K5900 Constipation, unspecified: Secondary | ICD-10-CM | POA: Diagnosis not present

## 2022-10-22 DIAGNOSIS — K219 Gastro-esophageal reflux disease without esophagitis: Secondary | ICD-10-CM | POA: Diagnosis not present

## 2022-10-22 DIAGNOSIS — K5730 Diverticulosis of large intestine without perforation or abscess without bleeding: Secondary | ICD-10-CM | POA: Diagnosis not present

## 2022-10-22 DIAGNOSIS — R682 Dry mouth, unspecified: Secondary | ICD-10-CM | POA: Diagnosis not present

## 2022-11-02 DIAGNOSIS — K219 Gastro-esophageal reflux disease without esophagitis: Secondary | ICD-10-CM | POA: Diagnosis not present

## 2022-11-02 DIAGNOSIS — K2289 Other specified disease of esophagus: Secondary | ICD-10-CM | POA: Diagnosis not present

## 2022-11-02 DIAGNOSIS — K2940 Chronic atrophic gastritis without bleeding: Secondary | ICD-10-CM | POA: Diagnosis not present

## 2022-11-02 DIAGNOSIS — R1013 Epigastric pain: Secondary | ICD-10-CM | POA: Diagnosis not present

## 2022-11-02 DIAGNOSIS — K3189 Other diseases of stomach and duodenum: Secondary | ICD-10-CM | POA: Diagnosis not present

## 2022-11-04 ENCOUNTER — Other Ambulatory Visit: Payer: Self-pay | Admitting: Physician Assistant

## 2022-11-13 DIAGNOSIS — K5900 Constipation, unspecified: Secondary | ICD-10-CM | POA: Diagnosis not present

## 2022-11-13 DIAGNOSIS — K219 Gastro-esophageal reflux disease without esophagitis: Secondary | ICD-10-CM | POA: Diagnosis not present

## 2022-11-13 DIAGNOSIS — R897 Abnormal histological findings in specimens from other organs, systems and tissues: Secondary | ICD-10-CM | POA: Diagnosis not present

## 2022-11-13 DIAGNOSIS — R1013 Epigastric pain: Secondary | ICD-10-CM | POA: Diagnosis not present

## 2022-11-24 ENCOUNTER — Other Ambulatory Visit: Payer: Self-pay | Admitting: Rheumatology

## 2022-11-24 DIAGNOSIS — M3390 Dermatopolymyositis, unspecified, organ involvement unspecified: Secondary | ICD-10-CM

## 2022-11-25 DIAGNOSIS — E039 Hypothyroidism, unspecified: Secondary | ICD-10-CM | POA: Diagnosis not present

## 2022-11-25 DIAGNOSIS — K2940 Chronic atrophic gastritis without bleeding: Secondary | ICD-10-CM | POA: Diagnosis not present

## 2022-11-25 DIAGNOSIS — D510 Vitamin B12 deficiency anemia due to intrinsic factor deficiency: Secondary | ICD-10-CM | POA: Diagnosis not present

## 2022-11-25 DIAGNOSIS — Z299 Encounter for prophylactic measures, unspecified: Secondary | ICD-10-CM | POA: Diagnosis not present

## 2022-11-25 DIAGNOSIS — I10 Essential (primary) hypertension: Secondary | ICD-10-CM | POA: Diagnosis not present

## 2022-12-01 DIAGNOSIS — E538 Deficiency of other specified B group vitamins: Secondary | ICD-10-CM | POA: Diagnosis not present

## 2022-12-03 ENCOUNTER — Other Ambulatory Visit: Payer: Self-pay | Admitting: Physician Assistant

## 2022-12-15 DIAGNOSIS — E538 Deficiency of other specified B group vitamins: Secondary | ICD-10-CM | POA: Diagnosis not present

## 2022-12-16 DIAGNOSIS — L905 Scar conditions and fibrosis of skin: Secondary | ICD-10-CM | POA: Diagnosis not present

## 2022-12-16 DIAGNOSIS — C44519 Basal cell carcinoma of skin of other part of trunk: Secondary | ICD-10-CM | POA: Diagnosis not present

## 2022-12-16 DIAGNOSIS — Z85828 Personal history of other malignant neoplasm of skin: Secondary | ICD-10-CM | POA: Diagnosis not present

## 2022-12-16 DIAGNOSIS — C44712 Basal cell carcinoma of skin of right lower limb, including hip: Secondary | ICD-10-CM | POA: Diagnosis not present

## 2022-12-16 NOTE — Progress Notes (Unsigned)
Office Visit Note  Patient: Carmen Woods             Date of Birth: January 18, 1956           MRN: [id number]             PCP: Ignatius Specking, MD Referring: Ignatius Specking, MD Visit Date: 12/30/2022 Occupation: @GUAROCC @  Subjective:  Dry mouth  History of Present Illness: Carmen Woods is a 67 y.o. female with history of dermatomyositis, osteoarthritis, and Sjogren's syndrome.  Patient remains on Imuran 100 mg daily.  She is tolerating Imuran without any side effects and has not missed any doses recently.  She denies any signs or symptoms of a myositis flare.  She has not noticed any increased muscular weakness.  She states that she does have ongoing photosensitivity and has been following up closely with dermatology.  She has had several biopsy-proven basal cell carcinomas removed in the past.  She has been advised to avoid direct sun exposure.  She continues to have chronic sicca symptoms.  Her mouth dryness has been severe.  She has been seeing the dentist every 6 months and has been doing fluoride treatments.  She uses over-the-counter products and tries to drink water throughout the day but continues to have severe symptoms.  She denies any parotid swelling or tenderness.  Patient reports that since her last office visit she has been diagnosed with pernicious anemia.  She has been getting B12 injections every 14 days.  Her energy level has improved slightly.  Patient has been taking trazodone 50 mg at bedtime as needed for insomnia.  She states she is only been taking trazodone about twice a week since her sister has been staying with her during the week while recovering from a fracture.  She has had difficulty with sleep onset but once she is asleep she has been staying asleep since starting trazodone.    Activities of Daily Living:  Patient reports morning stiffness for several hours.   Patient Denies nocturnal pain.  Difficulty dressing/grooming: Denies Difficulty climbing  stairs: Reports Difficulty getting out of chair: Reports Difficulty using hands for taps, buttons, cutlery, and/or writing: Reports  Review of Systems  Constitutional:  Positive for fatigue.  HENT:  Positive for mouth dryness. Negative for mouth sores.   Eyes:  Positive for dryness.  Respiratory:  Negative for shortness of breath.   Cardiovascular:  Positive for palpitations. Negative for chest pain.  Gastrointestinal:  Positive for constipation and diarrhea. Negative for blood in stool.  Endocrine: Negative for increased urination.  Genitourinary:  Negative for involuntary urination.  Musculoskeletal:  Positive for joint pain, joint pain, myalgias, muscle weakness, morning stiffness, muscle tenderness and myalgias. Negative for gait problem and joint swelling.  Skin:  Positive for color change. Negative for rash, hair loss and sensitivity to sunlight.  Allergic/Immunologic: Negative for susceptible to infections.  Neurological:  Negative for dizziness and headaches.  Hematological:  Positive for swollen glands.  Psychiatric/Behavioral:  Positive for sleep disturbance. Negative for depressed mood. The patient is nervous/anxious.     PMFS History:  Patient Active Problem List   Diagnosis Date Noted   Basal cell carcinoma (BCC) 08/25/2017   Primary osteoarthritis of both hands 03/18/2017   Primary osteoarthritis of both feet 03/18/2017   High risk medication use 11/10/2016   Galtron's Papules 11/10/2016   Other fatigue 11/10/2016   Spondylosis of lumbar region without myelopathy or radiculopathy 11/10/2016   History of anxiety 11/10/2016  Vitamin D deficiency 11/10/2016   History of vertigo 11/10/2016   Sjogren's syndrome with keratoconjunctivitis sicca 11/10/2016   History of hypothyroidism 11/10/2016   Dermatomyositis (HCC) 01/07/2015   Mitral regurgitation 01/19/2013   Precordial pain 01/19/2013    Past Medical History:  Diagnosis Date   Basal cell carcinoma    DDD  (degenerative disc disease), lumbar    Dermatomyositis 12/21/2014   Difficulty swallowing    Dry eyes    Dry mouth    Hypothyroidism    Mass of left thigh 12/21/2014   Pernicious anemia    per patient   Rash    hands, left thigh    Family History  Problem Relation Age of Onset   Heart Problems Mother        Pacermaker   Breast cancer Mother    Diabetes Father    Osteoarthritis Sister    Kidney disease Sister [id number]   Rheum arthritis Sister    Thyroid disease Sister    Breast cancer Cousin        Maternal cousin   Past Surgical History:  Procedure Laterality Date   BACK SURGERY  1998   BASAL CELL CARCINOMA EXCISION  2023   BELPHAROPTOSIS REPAIR Bilateral 11/2014   MUSCLE BIOPSY Left 01/07/2015   Procedure: LEFT THIGH MUSCLE BIOPSY;  Surgeon: Claud KelpHaywood Ingram, MD;  Location: [facility];  Service: General;  Laterality: Left;   SKIN BIOPSY  06/23/2022   5 places removed per patient   skin cancer removal Right 2020   hand   skin cancer removal   07/2017   Social History   Social History Narrative   Not on file   Immunization History  Administered Date(s) Administered   Influenza,inj,Quad PF,6+ Mos 09/29/2018   Influenza,inj,quad, With Preservative 07/12/2017   Moderna Sars-Covid-2 Vaccination 11/30/2019, 12/27/2019     Objective: Vital Signs: BP (!) 158/80 (BP Location: Left Arm, Patient Position: Sitting, Cuff Size: Normal)   Pulse (!) 56   Resp 16   Ht 5\' 7"  (1.702 m)   Wt 182 lb (82.6 kg)   BMI 28.51 kg/m    Physical Exam Vitals and nursing note reviewed.  Constitutional:      Appearance: She is well-developed.  HENT:     Head: Normocephalic and atraumatic.  Eyes:     Conjunctiva/sclera: Conjunctivae normal.  Cardiovascular:     Rate and Rhythm: Normal rate and regular rhythm.     Heart sounds: Normal heart sounds.  Pulmonary:     Effort: Pulmonary effort is normal.     Breath sounds: Normal breath sounds.  Abdominal:     General: Bowel  sounds are normal.     Palpations: Abdomen is soft.  Musculoskeletal:     Cervical back: Normal range of motion.  Lymphadenopathy:     Cervical: No cervical adenopathy.  Skin:    General: Skin is warm and dry.     Capillary Refill: Capillary refill takes less than 2 seconds.  Neurological:     Mental Status: She is alert and oriented to person, place, and time.  Psychiatric:        Behavior: Behavior normal.      Musculoskeletal Exam: C-spine has limited range of motion without rotation.  Shoulder joints have good range of motion with some discomfort bilaterally.  Elbow joints and wrist joints have good range of motion.  PIP and DIP thickening consistent with osteoarthritis of both hands.  Left hip is slightly limited range of motion.  Right hip has good range of motion with no groin pain.  Knee joints have good range of motion with crepitus but no warmth or effusion.  Ankle joints have good range of motion with no tenderness or joint swelling.  Full strength of upper and lower extremities noted.  CDAI Exam: CDAI Score: -- Patient Global: --; Provider Global: -- Swollen: --; Tender: -- Joint Exam 12/30/2022   No joint exam has been documented for this visit   There is currently no information documented on the homunculus. Go to the Rheumatology activity and complete the homunculus joint exam.  Investigation: No additional findings.  Imaging: No results found.  Recent Labs: Lab Results  Component Value Date   WBC 5.4 09/29/2022   HGB 13.4 09/29/2022   PLT 236 09/29/2022   NA 141 09/29/2022   K 4.5 09/29/2022   CL 106 09/29/2022   CO2 29 09/29/2022   GLUCOSE 98 09/29/2022   BUN 7 09/29/2022   CREATININE 0.77 09/29/2022   BILITOT 0.7 09/29/2022   ALKPHOS 99 03/22/2017   AST 23 09/29/2022   ALT 11 09/29/2022   PROT 7.1 09/29/2022   ALBUMIN 4.3 03/22/2017   CALCIUM 9.7 09/29/2022   GFRAA 108 03/18/2021    Speciality Comments: No specialty comments  available.  Procedures:  No procedures performed Allergies: Codeine and Hydrocodone          Assessment / Plan:     Visit Diagnoses: Dermatomyositis - - Positive biopsy May 2016, elevated CK and aldolase, Gottron's papules, muscle weakness, fatigue, positive Ro 52, positive anti-chromatin: She is not experiencing any signs or symptoms of a myositis flare.  She has full strength of upper and lower extremities on examination today.  No difficulty raising her arms above her head or rising from a seated position.  She has clinically been doing well on Imuran 100 mg daily. CK within normal limits on 09/29/2022.  Order for CK released today. Discussed the increased risk for developing cancer in patients with dermatomyositis. Discussed the importance of remaining up-to-date with age-related cancer screenings. Discussed the importance of avoiding direct sun exposure.  Discussed the use of PFG apparel and laundry detergent with SPF.  She plans on continue to follow-up closely with dermatology given history of basal cell carcinomas. She was advised to notify us if she develops signs or symptoms of a flare.  No medication changes will be made at this time.  She will follow-up in the office in 3 months or sooner if needed. - Plan: CK  High risk medication use - Imuran 100 mg by mouth daily.  CBC and CMP within normal limits on 09/29/2022.  Orders for CBC and CMP were released today.  She will continue to require updated lab work every 3 months. - Plan: CBC with Differential/Platelet, COMPLETE METABOLIC PANEL WITH GFR  Sjogren's syndrome with keratoconjunctivitis sicca - +ANA, +Ro: She continues to have chronic sicca symptoms.  Her mouth dryness has been severe despite using over-the-counter products for symptomatic relief.  She has been following up closely with her dentist every 6 months and has been having fluoride treatments.Recommended dental cleanings quarterly.  She plans on reaching out to her  dentist to see if her visits can be coded for management of Sjogren's disease going forward.  I also discussed the use of basic bites to improve her enamel health and to help alleviate her symptoms of dry mouth.  Discussed the use of over-the-counter products including XyliMelts, smart mouthwash, and Biotene products. No signs  of active inflammatory arthritis were noted today.  No synovitis was noted. Discussed the 4-14 fold increased risk for developing lymphoma in patients with Sjogren's syndrome. The following lab work will be obtained today for further evaluation.  She will remain on Imuran as prescribed.  She was advised to notify us if she develops any signs or symptoms of a flare.  - Plan: CBC with Differential/Platelet, COMPLETE METABOLIC PANEL WITH GFR, C3 and C4, Sedimentation rate, Urinalysis, Routine w reflex microscopic, ANA, Serum protein electrophoresis with reflex, Sjogrens syndrome-A extractable nuclear antibody, Sjogrens syndrome-B extractable nuclear antibody  Primary osteoarthritis of both hands: She has PIP and DIP thickening consistent with osteoarthritis of both hands.  No synovitis was noted.  Complete fist formation bilaterally.  Primary osteoarthritis of both knees: Knee joints have good range of motion with crepitus bilaterally.  No warmth or effusion noted.  Patient has declined proceeding with viscosupplementation at this time.  Discussed the importance of lower extremity muscle strengthening.  Primary osteoarthritis of both feet: She has good range of motion of both ankle joints with no tenderness or synovitis.  She is wearing proper fitting shoes.  Spondylosis of lumbar region without myelopathy or radiculopathy: She is not currently experiencing any increased discomfort in her lower back.  No symptoms of radiculopathy.  Vitamin D deficiency: She is taking vitamin D supplement daily.  History of pernicious anemia - She has started to receive vitamin B12 injections every  14 days.  Basal cell carcinoma (BCC) of overlapping sites of skin - Followed by dermatology specialists of [facility].  Most recent office visit on 06/23/2022-underwent C&E for several lesions and blade biopsies.  Other insomnia - She has been given a prescription for trazodone 50 mg 1 tablet at bedtime for insomnia.  She continues to difficulty with sleep onset but once asleep she has been staying asleep.  Discussed the importance of good sleep hygiene including trying to go to bed at the same time every night, use of chamomile/sleepy time tea, and the use of essential oils.  Other fatigue: Energy level has improved since starting on B12 injections.  Other medical conditions are listed as follows:  History of hypothyroidism  History of anxiety   Orders: Orders Placed This Encounter  Procedures   CBC with Differential/Platelet   COMPLETE METABOLIC PANEL WITH GFR   CK   C3 and C4   Sedimentation rate   Urinalysis, Routine w reflex microscopic   ANA   Serum protein electrophoresis with reflex   Sjogrens syndrome-A extractable nuclear antibody   Sjogrens syndrome-B extractable nuclear antibody   No orders of the defined types were placed in this encounter.   Follow-Up Instructions: Return in about 3 months (around 03/31/2023) for DM, Sjogren's syndrome.   Gearldine Bienenstock, PA-C  Note - This record has been created using Dragon software.  Chart creation errors have been sought, but may not always  have been located. Such creation errors do not reflect on  the standard of medical care.

## 2022-12-29 DIAGNOSIS — E538 Deficiency of other specified B group vitamins: Secondary | ICD-10-CM | POA: Diagnosis not present

## 2022-12-30 ENCOUNTER — Encounter: Payer: Self-pay | Admitting: Physician Assistant

## 2022-12-30 ENCOUNTER — Ambulatory Visit: Payer: Medicare Other | Attending: Physician Assistant | Admitting: Physician Assistant

## 2022-12-30 VITALS — BP 158/80 | HR 56 | Resp 16 | Ht 67.0 in | Wt 182.0 lb

## 2022-12-30 DIAGNOSIS — M19041 Primary osteoarthritis, right hand: Secondary | ICD-10-CM

## 2022-12-30 DIAGNOSIS — M3390 Dermatopolymyositis, unspecified, organ involvement unspecified: Secondary | ICD-10-CM

## 2022-12-30 DIAGNOSIS — C4481 Basal cell carcinoma of overlapping sites of skin: Secondary | ICD-10-CM

## 2022-12-30 DIAGNOSIS — Z8659 Personal history of other mental and behavioral disorders: Secondary | ICD-10-CM

## 2022-12-30 DIAGNOSIS — M170 Bilateral primary osteoarthritis of knee: Secondary | ICD-10-CM | POA: Diagnosis not present

## 2022-12-30 DIAGNOSIS — R5383 Other fatigue: Secondary | ICD-10-CM

## 2022-12-30 DIAGNOSIS — E559 Vitamin D deficiency, unspecified: Secondary | ICD-10-CM

## 2022-12-30 DIAGNOSIS — G4709 Other insomnia: Secondary | ICD-10-CM

## 2022-12-30 DIAGNOSIS — M19072 Primary osteoarthritis, left ankle and foot: Secondary | ICD-10-CM

## 2022-12-30 DIAGNOSIS — M3313 Other dermatomyositis without myopathy: Secondary | ICD-10-CM

## 2022-12-30 DIAGNOSIS — M19042 Primary osteoarthritis, left hand: Secondary | ICD-10-CM

## 2022-12-30 DIAGNOSIS — Z79899 Other long term (current) drug therapy: Secondary | ICD-10-CM

## 2022-12-30 DIAGNOSIS — Z8639 Personal history of other endocrine, nutritional and metabolic disease: Secondary | ICD-10-CM | POA: Diagnosis not present

## 2022-12-30 DIAGNOSIS — M19071 Primary osteoarthritis, right ankle and foot: Secondary | ICD-10-CM

## 2022-12-30 DIAGNOSIS — Z862 Personal history of diseases of the blood and blood-forming organs and certain disorders involving the immune mechanism: Secondary | ICD-10-CM

## 2022-12-30 DIAGNOSIS — M3501 Sicca syndrome with keratoconjunctivitis: Secondary | ICD-10-CM | POA: Diagnosis not present

## 2022-12-30 DIAGNOSIS — M47816 Spondylosis without myelopathy or radiculopathy, lumbar region: Secondary | ICD-10-CM | POA: Diagnosis not present

## 2022-12-30 LAB — URINALYSIS, ROUTINE W REFLEX MICROSCOPIC: Leukocytes,Ua: NEGATIVE

## 2022-12-30 NOTE — Patient Instructions (Addendum)
Basic Bites (available online)   Recommend dental visits quarterly (code for sjogren's disease)    Standing Labs We placed an order today for your standing lab work.   Please have your standing labs drawn in 3 months   Please have your labs drawn 2 weeks prior to your appointment so that the provider can discuss your lab results at your appointment, if possible.  Please note that you may see your imaging and lab results in MyChart before we have reviewed them. We will contact you once all results are reviewed. Please allow our office up to 72 hours to thoroughly review all of the results before contacting the office for clarification of your results.  WALK-IN LAB HOURS  Monday through Thursday from 8:00 am -12:30 pm and 1:00 pm-5:00 pm and Friday from 8:00 am-12:00 pm.  Patients with office visits requiring labs will be seen before walk-in labs.  You may encounter longer than normal wait times. Please allow additional time. Wait times may be shorter on  Monday and Thursday afternoons.  We do not book appointments for walk-in labs. We appreciate your patience and understanding with our staff.   Labs are drawn by [facility]. Please bring your co-pay at the time of your lab draw.  You may receive a bill from [facility] for your lab work.  Please note if you are on Hydroxychloroquine and and an order has been placed for a Hydroxychloroquine level,  you will need to have it drawn 4 hours or more after your last dose.  If you wish to have your labs drawn at another location, please call the office 24 hours in advance so we can fax the orders.  The office is located at [address], [address], [place], [place] [address]   If you have any questions regarding directions or hours of operation,  please call [phone].   As a reminder, please drink plenty of water prior to coming for your lab work. Thanks!

## 2022-12-31 LAB — C3 AND C4
C3 Complement: 151 mg/dL (ref 83–193)
C4 Complement: 25 mg/dL (ref 15–57)

## 2022-12-31 LAB — URINALYSIS, ROUTINE W REFLEX MICROSCOPIC: Glucose, UA: NEGATIVE

## 2022-12-31 LAB — PROTEIN ELECTROPHORESIS, SERUM, WITH REFLEX: Total Protein: 6.8 g/dL (ref 6.1–8.1)

## 2022-12-31 LAB — SJOGRENS SYNDROME-A EXTRACTABLE NUCLEAR ANTIBODY: SSA (Ro) (ENA) Antibody, IgG: >8 AI — AB

## 2022-12-31 NOTE — Progress Notes (Signed)
Absolute lymphocytes are low. Rest of CBC WNL.   Glucose is 117, rest of CMP WNL UA normal. CK WNL. ESR WNL

## 2022-12-31 NOTE — Progress Notes (Signed)
Ro antibody remains positive.  La antibody is negative.  Complements WNL

## 2023-01-01 LAB — URINALYSIS, ROUTINE W REFLEX MICROSCOPIC: Hgb urine dipstick: NEGATIVE

## 2023-01-01 LAB — ANTI-NUCLEAR AB-TITER (ANA TITER): ANA TITER: 1:40 {titer} — ABNORMAL HIGH

## 2023-01-01 LAB — CK: Total CK: 99 U/L (ref 29–143)

## 2023-01-02 LAB — COMPLETE METABOLIC PANEL WITH GFR
AG Ratio: 1.6 (calc) (ref 1.0–2.5)
ALT: 10 U/L (ref 6–29)
AST: 18 U/L (ref 10–35)
Albumin: 4.2 g/dL (ref 3.6–5.1)
Alkaline phosphatase (APISO): 79 U/L (ref 37–153)
BUN: 8 mg/dL (ref 7–25)
CO2: 30 mmol/L (ref 20–32)
Calcium: 9.5 mg/dL (ref 8.6–10.4)
Chloride: 105 mmol/L (ref 98–110)
Creat: 0.76 mg/dL (ref 0.50–1.05)
Globulin: 2.7 g/dL (calc) (ref 1.9–3.7)
Glucose, Bld: 117 mg/dL — ABNORMAL HIGH (ref 65–99)
Potassium: 4.4 mmol/L (ref 3.5–5.3)
Sodium: 141 mmol/L (ref 135–146)
Total Bilirubin: 0.6 mg/dL (ref 0.2–1.2)
Total Protein: 6.9 g/dL (ref 6.1–8.1)
eGFR: 86 mL/min/{1.73_m2} (ref >=60)

## 2023-01-02 LAB — SJOGRENS SYNDROME-B EXTRACTABLE NUCLEAR ANTIBODY: SSB (La) (ENA) Antibody, IgG: <1 AI

## 2023-01-02 LAB — CBC WITH DIFFERENTIAL/PLATELET
Absolute Monocytes: 361 cells/uL (ref 200–950)
Basophils Absolute: 40 cells/uL (ref 0–200)
Basophils Relative: 0.9 %
Eosinophils Absolute: 180 cells/uL (ref 15–500)
Eosinophils Relative: 4.1 %
HCT: 39 % (ref 35.0–45.0)
Hemoglobin: 13.1 g/dL (ref 11.7–15.5)
Lymphs Abs: 713 cells/uL — ABNORMAL LOW (ref 850–3900)
MCH: 29 pg (ref 27.0–33.0)
MCHC: 33.6 g/dL (ref 32.0–36.0)
MCV: 86.5 fL (ref 80.0–100.0)
MPV: 10.5 fL (ref 7.5–12.5)
Monocytes Relative: 8.2 %
Neutro Abs: 3106 cells/uL (ref 1500–7800)
Neutrophils Relative %: 70.6 %
Platelets: 234 10*3/uL (ref 140–400)
RBC: 4.51 10*6/uL (ref 3.80–5.10)
RDW: 12.6 % (ref 11.0–15.0)
Total Lymphocyte: 16.2 %
WBC: 4.4 10*3/uL (ref 3.8–10.8)

## 2023-01-02 LAB — ANTI-NUCLEAR AB-TITER (ANA TITER): ANA Titer 1: 1:320 {titer} — ABNORMAL HIGH

## 2023-01-02 LAB — URINALYSIS, ROUTINE W REFLEX MICROSCOPIC
Bilirubin Urine: NEGATIVE
Ketones, ur: NEGATIVE
Nitrite: NEGATIVE
Protein, ur: NEGATIVE
Specific Gravity, Urine: 1.008 (ref 1.001–1.035)
pH: 8 (ref 5.0–8.0)

## 2023-01-02 LAB — ANA: Anti Nuclear Antibody (ANA): POSITIVE — AB

## 2023-01-02 LAB — PROTEIN ELECTROPHORESIS, SERUM, WITH REFLEX
Albumin ELP: 4.1 g/dL (ref 3.8–4.8)
Alpha 1: 0.3 g/dL (ref 0.2–0.3)
Alpha 2: 0.6 g/dL (ref 0.5–0.9)
Beta 2: 0.4 g/dL (ref 0.2–0.5)
Beta Globulin: 0.5 g/dL (ref 0.4–0.6)
Gamma Globulin: 1.1 g/dL (ref 0.8–1.7)

## 2023-01-02 LAB — SEDIMENTATION RATE: Sed Rate: 9 mm/h (ref 0–30)

## 2023-01-04 NOTE — Progress Notes (Signed)
ANA remains positive.   SPEP did not reveal any abnormal protein bands.

## 2023-01-21 DIAGNOSIS — E538 Deficiency of other specified B group vitamins: Secondary | ICD-10-CM | POA: Diagnosis not present

## 2023-02-12 DIAGNOSIS — E538 Deficiency of other specified B group vitamins: Secondary | ICD-10-CM | POA: Diagnosis not present

## 2023-02-23 DIAGNOSIS — F331 Major depressive disorder, recurrent, moderate: Secondary | ICD-10-CM | POA: Diagnosis not present

## 2023-02-23 DIAGNOSIS — Z299 Encounter for prophylactic measures, unspecified: Secondary | ICD-10-CM | POA: Diagnosis not present

## 2023-02-23 DIAGNOSIS — R5383 Other fatigue: Secondary | ICD-10-CM | POA: Diagnosis not present

## 2023-02-23 DIAGNOSIS — E039 Hypothyroidism, unspecified: Secondary | ICD-10-CM | POA: Diagnosis not present

## 2023-02-23 DIAGNOSIS — D849 Immunodeficiency, unspecified: Secondary | ICD-10-CM | POA: Diagnosis not present

## 2023-02-23 DIAGNOSIS — M2550 Pain in unspecified joint: Secondary | ICD-10-CM | POA: Diagnosis not present

## 2023-02-23 DIAGNOSIS — E538 Deficiency of other specified B group vitamins: Secondary | ICD-10-CM | POA: Diagnosis not present

## 2023-03-01 ENCOUNTER — Other Ambulatory Visit: Payer: Self-pay | Admitting: *Deleted

## 2023-03-01 DIAGNOSIS — M3313 Other dermatomyositis without myopathy: Secondary | ICD-10-CM

## 2023-03-01 MED ORDER — AZATHIOPRINE 50 MG PO TABS
100.0000 mg | ORAL_TABLET | Freq: Every day | ORAL | 0 refills | Status: DC
Start: 2023-03-01 — End: 2023-05-31

## 2023-03-01 NOTE — Telephone Encounter (Signed)
Patient contacted the office for a a refill on her Imuran. Patient states she would ike it to go to her local pharmacy this one time.   Last Fill: 09/23/2022  Labs: 12/30/2022 Absolute lymphocytes are low. Rest of CBC WNL.   Glucose is 117, rest of CMP WNL UA normal. CK WNL. ESR WNL  Next Visit: 03/31/2023  Last Visit: 12/30/2022  DX: Dermatomyositis   Current Dose per office note 12/30/2022: Imuran 100 mg by mouth daily   Okay to refill Imuran?

## 2023-03-18 NOTE — Progress Notes (Deleted)
Office Visit Note  Patient: Carmen Woods             Date of Birth: [birth year]           MRN: [id number]             PCP: Ignatius Specking, MD Referring: Ignatius Specking, MD Visit Date: 03/31/2023 Occupation: @GUAROCC @  Subjective:    History of Present Illness: Carmen Woods is a 67 y.o. female with history of dermatomyositis and sjogren's syndrome.  Patient remains on Imuran 100 mg by mouth daily.   Lab work from 12/30/2022 was reviewed today in the office Ro ab > 8, ANA 1: 320 nuclear fine speckled, 1: 40 cytoplasmic, SPEP normal, UA normal, complements within normal limits, CK within normal limits.  CBC and CMP were updated on 12/30/2022.  Orders for CBC and CMP were released today.  Activities of Daily Living:  Patient reports morning stiffness for *** {minute/hour:19697}.   Patient {ACTIONS;DENIES/REPORTS:21021675::"Denies"} nocturnal pain.  Difficulty dressing/grooming: {ACTIONS;DENIES/REPORTS:21021675::"Denies"} Difficulty climbing stairs: {ACTIONS;DENIES/REPORTS:21021675::"Denies"} Difficulty getting out of chair: {ACTIONS;DENIES/REPORTS:21021675::"Denies"} Difficulty using hands for taps, buttons, cutlery, and/or writing: {ACTIONS;DENIES/REPORTS:21021675::"Denies"}  No Rheumatology ROS completed.   PMFS History:  Patient Active Problem List   Diagnosis Date Noted   Basal cell carcinoma (BCC) 08/25/2017   Primary osteoarthritis of both hands 03/18/2017   Primary osteoarthritis of both feet 03/18/2017   High risk medication use 11/10/2016   Galtron's Papules 11/10/2016   Other fatigue 11/10/2016   Spondylosis of lumbar region without myelopathy or radiculopathy 11/10/2016   History of anxiety 11/10/2016   Vitamin D deficiency 11/10/2016   History of vertigo 11/10/2016   Sjogren's syndrome with keratoconjunctivitis sicca (HCC) 11/10/2016   History of hypothyroidism 11/10/2016   Dermatomyositis (HCC) 01/07/2015   Mitral regurgitation 01/19/2013    Precordial pain 01/19/2013    Past Medical History:  Diagnosis Date   Basal cell carcinoma    DDD (degenerative disc disease), lumbar    Dermatomyositis (HCC) 12/21/2014   Difficulty swallowing    Dry eyes    Dry mouth    Hypothyroidism    Mass of left thigh 12/21/2014   Pernicious anemia    per patient   Rash    hands, left thigh    Family History  Problem Relation Age of Onset   Heart Problems Mother        Pacermaker   Breast cancer Mother    Diabetes Father    Osteoarthritis Sister    Kidney disease Sister 62   Rheum arthritis Sister    Thyroid disease Sister    Breast cancer Cousin        Maternal cousin   Past Surgical History:  Procedure Laterality Date   BACK SURGERY  1998   BASAL CELL CARCINOMA EXCISION  2023   BELPHAROPTOSIS REPAIR Bilateral 11/2014   MUSCLE BIOPSY Left 01/07/2015   Procedure: LEFT THIGH MUSCLE BIOPSY;  Surgeon: Claud Kelp, MD;  Location: [facility];  Service: General;  Laterality: Left;   SKIN BIOPSY  06/23/2022   5 places removed per patient   skin cancer removal Right 2020   hand   skin cancer removal   07/2017   Social History   Social History Narrative   Not on file   Immunization History  Administered Date(s) Administered   Influenza,inj,Quad PF,6+ Mos 09/29/2018   Influenza,inj,quad, With Preservative 07/12/2017   Moderna Sars-Covid-2 Vaccination 11/30/2019, 12/27/2019     Objective: Vital Signs: There were no vitals  taken for this visit.   Physical Exam Vitals and nursing note reviewed.  Constitutional:      Appearance: She is well-developed.  HENT:     Head: Normocephalic and atraumatic.  Eyes:     Conjunctiva/sclera: Conjunctivae normal.  Cardiovascular:     Rate and Rhythm: Normal rate and regular rhythm.     Heart sounds: Normal heart sounds.  Pulmonary:     Effort: Pulmonary effort is normal.     Breath sounds: Normal breath sounds.  Abdominal:     General: Bowel sounds are normal.      Palpations: Abdomen is soft.  Musculoskeletal:     Cervical back: Normal range of motion.  Lymphadenopathy:     Cervical: No cervical adenopathy.  Skin:    General: Skin is warm and dry.     Capillary Refill: Capillary refill takes less than 2 seconds.  Neurological:     Mental Status: She is alert and oriented to person, place, and time.  Psychiatric:        Behavior: Behavior normal.      Musculoskeletal Exam: ***  CDAI Exam: CDAI Score: -- Patient Global: --; Provider Global: -- Swollen: --; Tender: -- Joint Exam 03/31/2023   No joint exam has been documented for this visit   There is currently no information documented on the homunculus. Go to the Rheumatology activity and complete the homunculus joint exam.  Investigation: No additional findings.  Imaging: No results found.  Recent Labs: Lab Results  Component Value Date   WBC 4.4 12/30/2022   HGB 13.1 12/30/2022   PLT 234 12/30/2022   NA 141 12/30/2022   K 4.4 12/30/2022   CL 105 12/30/2022   CO2 30 12/30/2022   GLUCOSE 117 (H) 12/30/2022   BUN 8 12/30/2022   CREATININE 0.76 12/30/2022   BILITOT 0.6 12/30/2022   ALKPHOS 99 03/22/2017   AST 18 12/30/2022   ALT 10 12/30/2022   PROT 6.9 12/30/2022   PROT 6.8 12/30/2022   ALBUMIN 4.3 03/22/2017   CALCIUM 9.5 12/30/2022   GFRAA 108 03/18/2021    Speciality Comments: No specialty comments available.  Procedures:  No procedures performed Allergies: Codeine and Hydrocodone   Assessment / Plan:     Visit Diagnoses: Dermatomyositis (HCC)  High risk medication use  Sjogren's syndrome with keratoconjunctivitis sicca (HCC)  Primary osteoarthritis of both hands  Primary osteoarthritis of both knees  Primary osteoarthritis of both feet  Spondylosis of lumbar region without myelopathy or radiculopathy  Vitamin D deficiency  History of pernicious anemia  Basal cell carcinoma (BCC) of overlapping sites of skin  History of  hypothyroidism  History of anxiety  Other insomnia  Other fatigue  Orders: No orders of the defined types were placed in this encounter.  No orders of the defined types were placed in this encounter.   Face-to-face time spent with patient was *** minutes. Greater than 50% of time was spent in counseling and coordination of care.  Follow-Up Instructions: No follow-ups on file.   Gearldine Bienenstock, PA-C  Note - This record has been created using Dragon software.  Chart creation errors have been sought, but may not always  have been located. Such creation errors do not reflect on  the standard of medical care.

## 2023-03-29 ENCOUNTER — Other Ambulatory Visit: Payer: Self-pay | Admitting: *Deleted

## 2023-03-29 MED ORDER — TRAZODONE HCL 50 MG PO TABS: 50.0000 mg | ORAL_TABLET | Freq: Every day | ORAL | 2 refills | Status: DC

## 2023-03-29 NOTE — Telephone Encounter (Signed)
Last Fill: 09/29/2022  Next Visit: 03/31/2023  Last Visit: 12/30/2022  Dx: Other insomnia   Current Dose per office note on 12/30/2022: trazodone 50 mg 1 tablet at bedtime for insomnia   Okay to refill Trazodone?

## 2023-03-31 ENCOUNTER — Ambulatory Visit: Payer: Medicare Other | Admitting: Physician Assistant

## 2023-03-31 DIAGNOSIS — Z862 Personal history of diseases of the blood and blood-forming organs and certain disorders involving the immune mechanism: Secondary | ICD-10-CM

## 2023-03-31 DIAGNOSIS — M3313 Other dermatomyositis without myopathy: Secondary | ICD-10-CM

## 2023-03-31 DIAGNOSIS — R5383 Other fatigue: Secondary | ICD-10-CM

## 2023-03-31 DIAGNOSIS — C4481 Basal cell carcinoma of overlapping sites of skin: Secondary | ICD-10-CM

## 2023-03-31 DIAGNOSIS — M170 Bilateral primary osteoarthritis of knee: Secondary | ICD-10-CM

## 2023-03-31 DIAGNOSIS — M47816 Spondylosis without myelopathy or radiculopathy, lumbar region: Secondary | ICD-10-CM

## 2023-03-31 DIAGNOSIS — E559 Vitamin D deficiency, unspecified: Secondary | ICD-10-CM

## 2023-03-31 DIAGNOSIS — Z8659 Personal history of other mental and behavioral disorders: Secondary | ICD-10-CM

## 2023-03-31 DIAGNOSIS — G4709 Other insomnia: Secondary | ICD-10-CM

## 2023-03-31 DIAGNOSIS — Z79899 Other long term (current) drug therapy: Secondary | ICD-10-CM

## 2023-03-31 DIAGNOSIS — M19071 Primary osteoarthritis, right ankle and foot: Secondary | ICD-10-CM

## 2023-03-31 DIAGNOSIS — M19042 Primary osteoarthritis, left hand: Secondary | ICD-10-CM

## 2023-03-31 DIAGNOSIS — M3501 Sicca syndrome with keratoconjunctivitis: Secondary | ICD-10-CM

## 2023-03-31 DIAGNOSIS — Z8639 Personal history of other endocrine, nutritional and metabolic disease: Secondary | ICD-10-CM

## 2023-04-01 DIAGNOSIS — R5383 Other fatigue: Secondary | ICD-10-CM | POA: Diagnosis not present

## 2023-04-01 DIAGNOSIS — Z0000 Encounter for general adult medical examination without abnormal findings: Secondary | ICD-10-CM | POA: Diagnosis not present

## 2023-04-01 DIAGNOSIS — Z1331 Encounter for screening for depression: Secondary | ICD-10-CM | POA: Diagnosis not present

## 2023-04-01 DIAGNOSIS — Z7189 Other specified counseling: Secondary | ICD-10-CM | POA: Diagnosis not present

## 2023-04-01 DIAGNOSIS — Z299 Encounter for prophylactic measures, unspecified: Secondary | ICD-10-CM | POA: Diagnosis not present

## 2023-04-01 DIAGNOSIS — E039 Hypothyroidism, unspecified: Secondary | ICD-10-CM | POA: Diagnosis not present

## 2023-04-01 DIAGNOSIS — Z1339 Encounter for screening examination for other mental health and behavioral disorders: Secondary | ICD-10-CM | POA: Diagnosis not present

## 2023-04-01 DIAGNOSIS — F331 Major depressive disorder, recurrent, moderate: Secondary | ICD-10-CM | POA: Diagnosis not present

## 2023-04-02 DIAGNOSIS — Z79899 Other long term (current) drug therapy: Secondary | ICD-10-CM | POA: Diagnosis not present

## 2023-04-02 DIAGNOSIS — E039 Hypothyroidism, unspecified: Secondary | ICD-10-CM | POA: Diagnosis not present

## 2023-04-02 DIAGNOSIS — E538 Deficiency of other specified B group vitamins: Secondary | ICD-10-CM | POA: Diagnosis not present

## 2023-04-02 DIAGNOSIS — E559 Vitamin D deficiency, unspecified: Secondary | ICD-10-CM | POA: Diagnosis not present

## 2023-04-02 DIAGNOSIS — R5383 Other fatigue: Secondary | ICD-10-CM | POA: Diagnosis not present

## 2023-04-02 DIAGNOSIS — E7800 Pure hypercholesterolemia, unspecified: Secondary | ICD-10-CM | POA: Diagnosis not present

## 2023-04-09 ENCOUNTER — Telehealth: Payer: Self-pay

## 2023-04-09 NOTE — Telephone Encounter (Signed)
Her Vitamin D was high normal. She may continue the current dose. She may go by the recommended dose on the bottle for Vitamin E.

## 2023-04-09 NOTE — Telephone Encounter (Signed)
Patient contacted the office and states she was told to continue taking Vitmain D3 and Vitamin E. Patient inquires how many units of each she should be taking daily. Patient call back number is [phone]. Please advise.   Patient states she wanted to let Dr. Corliss Skains know that she was told by her dermatologist to stay out of the sun completely because she has had around 20 skin cancer spots removed and still has to have some more removed.

## 2023-04-09 NOTE — Telephone Encounter (Signed)
Patient advised her Vitamin D was high normal. She may continue the current dose. She may go by the recommended dose on the bottle for Vitamin E. Patient expressed understanding.

## 2023-04-19 DIAGNOSIS — C44612 Basal cell carcinoma of skin of right upper limb, including shoulder: Secondary | ICD-10-CM | POA: Diagnosis not present

## 2023-04-19 DIAGNOSIS — C44519 Basal cell carcinoma of skin of other part of trunk: Secondary | ICD-10-CM | POA: Diagnosis not present

## 2023-04-19 DIAGNOSIS — C44619 Basal cell carcinoma of skin of left upper limb, including shoulder: Secondary | ICD-10-CM | POA: Diagnosis not present

## 2023-05-05 DIAGNOSIS — H04123 Dry eye syndrome of bilateral lacrimal glands: Secondary | ICD-10-CM | POA: Diagnosis not present

## 2023-05-05 DIAGNOSIS — H43813 Vitreous degeneration, bilateral: Secondary | ICD-10-CM | POA: Diagnosis not present

## 2023-05-05 DIAGNOSIS — H2513 Age-related nuclear cataract, bilateral: Secondary | ICD-10-CM | POA: Diagnosis not present

## 2023-05-11 DIAGNOSIS — E538 Deficiency of other specified B group vitamins: Secondary | ICD-10-CM | POA: Diagnosis not present

## 2023-05-31 ENCOUNTER — Other Ambulatory Visit: Payer: Self-pay | Admitting: *Deleted

## 2023-05-31 DIAGNOSIS — Z79899 Other long term (current) drug therapy: Secondary | ICD-10-CM

## 2023-05-31 DIAGNOSIS — M3313 Other dermatomyositis without myopathy: Secondary | ICD-10-CM

## 2023-05-31 MED ORDER — AZATHIOPRINE 50 MG PO TABS: 100.0000 mg | ORAL_TABLET | Freq: Every day | ORAL | 0 refills | Status: DC

## 2023-05-31 NOTE — Telephone Encounter (Signed)
Patient called requesting refill of Imuran to be sent to [facility].  Last Fill: 03/01/2023  Labs: 12/30/2022 Absolute lymphocytes are low. Rest of CBC WNL.  Glucose is 117, rest of CMP WNL  Next Visit: 06/10/2023  Last Visit: 12/30/2022  DX: Dermatomyositis   Current Dose per office note 12/30/2022: Imuran 100 mg by mouth daily   Patient updating labs 06/03/2023. Lab orders released   Okay to refill Imuran?

## 2023-06-01 DIAGNOSIS — Z79899 Other long term (current) drug therapy: Secondary | ICD-10-CM | POA: Diagnosis not present

## 2023-06-02 LAB — CBC WITH DIFFERENTIAL/PLATELET
Basophils Absolute: 0.1 10*3/uL (ref 0.0–0.2)
Basos: 1 %
EOS (ABSOLUTE): 0.3 10*3/uL (ref 0.0–0.4)
Eos: 6 %
Hematocrit: 40.5 % (ref 34.0–46.6)
Hemoglobin: 13.3 g/dL (ref 11.1–15.9)
Immature Grans (Abs): 0 10*3/uL (ref 0.0–0.1)
Immature Granulocytes: 1 %
Lymphocytes Absolute: 0.9 10*3/uL (ref 0.7–3.1)
Lymphs: 19 %
MCH: 29.4 pg (ref 26.6–33.0)
MCHC: 32.8 g/dL (ref 31.5–35.7)
MCV: 90 fL (ref 79–97)
Monocytes Absolute: 0.3 10*3/uL (ref 0.1–0.9)
Monocytes: 7 %
Neutrophils Absolute: 3.3 10*3/uL (ref 1.4–7.0)
Neutrophils: 66 %
Platelets: 240 10*3/uL (ref 150–450)
RBC: 4.52 x10E6/uL (ref 3.77–5.28)
RDW: 12.4 % (ref 11.7–15.4)
WBC: 4.9 10*3/uL (ref 3.4–10.8)

## 2023-06-02 LAB — CMP14+EGFR
ALT: 13 IU/L (ref 0–32)
AST: 23 IU/L (ref 0–40)
Albumin: 4.4 g/dL (ref 3.9–4.9)
Alkaline Phosphatase: 93 IU/L (ref 44–121)
BUN/Creatinine Ratio: 19 (ref 12–28)
BUN: 13 mg/dL (ref 8–27)
Bilirubin Total: 0.4 mg/dL (ref 0.0–1.2)
CO2: 23 mmol/L (ref 20–29)
Calcium: 9.6 mg/dL (ref 8.7–10.3)
Chloride: 104 mmol/L (ref 96–106)
Creatinine, Ser: 0.7 mg/dL (ref 0.57–1.00)
Globulin, Total: 2.3 g/dL (ref 1.5–4.5)
Glucose: 97 mg/dL (ref 70–99)
Potassium: 4.2 mmol/L (ref 3.5–5.2)
Sodium: 141 mmol/L (ref 134–144)
Total Protein: 6.7 g/dL (ref 6.0–8.5)
eGFR: 95 mL/min/{1.73_m2} (ref >59)

## 2023-06-02 NOTE — Progress Notes (Signed)
CBC and CMP are normal.

## 2023-06-04 ENCOUNTER — Other Ambulatory Visit: Payer: Self-pay | Admitting: *Deleted

## 2023-06-04 DIAGNOSIS — M3313 Other dermatomyositis without myopathy: Secondary | ICD-10-CM

## 2023-06-04 MED ORDER — AZATHIOPRINE 50 MG PO TABS
100.0000 mg | ORAL_TABLET | Freq: Every day | ORAL | 0 refills | Status: DC
Start: 2023-06-04 — End: 2023-06-10

## 2023-06-04 NOTE — Progress Notes (Unsigned)
Office Visit Note  Patient: Carmen Woods             Date of Birth: 01-08-1956           MRN: [id number]             PCP: Ignatius Specking, MD Referring: Ignatius Specking, MD Visit Date: 06/10/2023 Occupation: @GUAROCC @  Subjective:  Pain in multiple joints   History of Present Illness: Carmen Woods is a 67 y.o. female with history of dermatomyositis and sjogren's syndrome.  Patient remains on Imuran 100 mg daily.  Patient continues to tolerate Imuran without any side effects.  She denies any signs or symptoms of a dermatomyositis flare.  She has had increased joint pain involving multiple joints including the left shoulder, both hands, and both knee joints.  Her right knee joint pain has been worse than the left knee.  She denies any joint swelling.  Patient remains hesitant to proceed with viscosupplementation over notify us if she changes her mind.  She denies any muscular weakness.  She continues to have chronic sicca symptoms.  She has occasional swollen lymph nodes.  She denies any sores in her mouth or nose. Patient continues to have difficulty falling asleep at night.  She is been taking trazodone 2 hours prior to bedtime but continues to have difficulty falling asleep on low dose trazodone.     Activities of Daily Living:  Patient reports morning stiffness for 1 hour.   Patient Denies nocturnal pain.  Difficulty dressing/grooming: Denies Difficulty climbing stairs: Reports Difficulty getting out of chair: Reports Difficulty using hands for taps, buttons, cutlery, and/or writing: Reports  Review of Systems  Constitutional:  Positive for fatigue.  HENT:  Positive for mouth dryness. Negative for mouth sores.   Eyes:  Positive for dryness.  Respiratory:  Negative for shortness of breath.   Cardiovascular:  Positive for chest pain and palpitations.  Gastrointestinal:  Positive for constipation and diarrhea. Negative for blood in stool.  Endocrine: Negative for  increased urination.  Genitourinary:  Negative for involuntary urination.  Musculoskeletal:  Positive for joint pain, gait problem, joint pain, joint swelling, myalgias, morning stiffness, muscle tenderness and myalgias. Negative for muscle weakness.  Skin:  Positive for rash, hair loss and sensitivity to sunlight. Negative for color change.  Allergic/Immunologic: Negative for susceptible to infections.  Neurological:  Positive for dizziness. Negative for headaches.  Hematological:  Positive for swollen glands.  Psychiatric/Behavioral:  Negative for depressed mood and sleep disturbance. The patient is nervous/anxious.     PMFS History:  Patient Active Problem List   Diagnosis Date Noted   Basal cell carcinoma (BCC) 08/25/2017   Primary osteoarthritis of both hands 03/18/2017   Primary osteoarthritis of both feet 03/18/2017   High risk medication use 11/10/2016   Galtron's Papules 11/10/2016   Other fatigue 11/10/2016   Spondylosis of lumbar region without myelopathy or radiculopathy 11/10/2016   History of anxiety 11/10/2016   Vitamin D deficiency 11/10/2016   History of vertigo 11/10/2016   Sjogren's syndrome with keratoconjunctivitis sicca (HCC) 11/10/2016   History of hypothyroidism 11/10/2016   Dermatomyositis (HCC) 01/07/2015   Mitral regurgitation 01/19/2013   Precordial pain 01/19/2013    Past Medical History:  Diagnosis Date   Basal cell carcinoma    DDD (degenerative disc disease), lumbar    Dermatomyositis (HCC) 12/21/2014   Difficulty swallowing    Dry eyes    Dry mouth    Hypothyroidism    Mass of  left thigh 12/21/2014   Pernicious anemia    per patient   Rash    hands, left thigh    Family History  Problem Relation Age of Onset   Heart Problems Mother        Pacermaker   Breast cancer Mother    Diabetes Father    Osteoarthritis Sister    Kidney disease Sister 28   Rheum arthritis Sister    Thyroid disease Sister    Breast cancer Cousin         Maternal cousin   Past Surgical History:  Procedure Laterality Date   BACK SURGERY  1998   BASAL CELL CARCINOMA EXCISION  2023   BELPHAROPTOSIS REPAIR Bilateral 11/2014   MUSCLE BIOPSY Left 01/07/2015   Procedure: LEFT THIGH MUSCLE BIOPSY;  Surgeon: Claud Kelp, MD;  Location: [facility];  Service: General;  Laterality: Left;   SKIN BIOPSY  06/23/2022   5 places removed per patient   skin cancer removal Right 2020   hand   skin cancer removal   07/2017   Social History   Social History Narrative   Not on file   Immunization History  Administered Date(s) Administered   Influenza,inj,Quad PF,6+ Mos 09/29/2018   Influenza,inj,quad, With Preservative 07/12/2017   Moderna Sars-Covid-2 Vaccination 11/30/2019, 12/27/2019     Objective: Vital Signs: BP 138/81 (BP Location: Left Arm, Patient Position: Sitting, Cuff Size: Normal)   Pulse 61   Resp 16   Ht 5\' 7"  (1.702 m)   Wt 177 lb 12.8 oz (80.6 kg)   BMI 27.85 kg/m    Physical Exam Vitals and nursing note reviewed.  Constitutional:      Appearance: She is well-developed.  HENT:     Head: Normocephalic and atraumatic.  Eyes:     Conjunctiva/sclera: Conjunctivae normal.  Cardiovascular:     Rate and Rhythm: Normal rate and regular rhythm.     Heart sounds: Normal heart sounds.  Pulmonary:     Effort: Pulmonary effort is normal.     Breath sounds: Normal breath sounds.  Abdominal:     General: Bowel sounds are normal.     Palpations: Abdomen is soft.  Musculoskeletal:     Cervical back: Normal range of motion.  Lymphadenopathy:     Cervical: No cervical adenopathy.  Skin:    General: Skin is warm and dry.     Capillary Refill: Capillary refill takes less than 2 seconds.  Neurological:     Mental Status: She is alert and oriented to person, place, and time.  Psychiatric:        Behavior: Behavior normal.      Musculoskeletal Exam: C-spine has limited range of motion without rotation.  Right  shoulder is full range of motion.  Left shoulder has limited abduction.  Elbow joints, wrist joints, MCPs, PIPs, DIPs have good range of motion with no synovitis.  Complete fist formation bilaterally.  Hip joints have good range of motion with no groin pain.  Painful range of motion of both knee joints especially the right knee.  Bilateral knee crepitus noted.  Mild warmth in the right knee but no effusion noted.  Ankle joints have good range of motion with no joint tenderness.  CDAI Exam: CDAI Score: -- Patient Global: --; Provider Global: -- Swollen: --; Tender: -- Joint Exam 06/10/2023   No joint exam has been documented for this visit   There is currently no information documented on the homunculus. Go to the Rheumatology  activity and complete the homunculus joint exam.  Investigation: No additional findings.  Imaging: No results found.  Recent Labs: Lab Results  Component Value Date   WBC 4.9 06/01/2023   HGB 13.3 06/01/2023   PLT 240 06/01/2023   NA 141 06/01/2023   K 4.2 06/01/2023   CL 104 06/01/2023   CO2 23 06/01/2023   GLUCOSE 97 06/01/2023   BUN 13 06/01/2023   CREATININE 0.70 06/01/2023   BILITOT 0.4 06/01/2023   ALKPHOS 93 06/01/2023   AST 23 06/01/2023   ALT 13 06/01/2023   PROT 6.7 06/01/2023   ALBUMIN 4.4 06/01/2023   CALCIUM 9.6 06/01/2023   GFRAA 108 03/18/2021    Speciality Comments: No specialty comments available.  Procedures:  No procedures performed Allergies: Codeine and Hydrocodone     Assessment / Plan:     Visit Diagnoses: Dermatomyositis (HCC) - Positive biopsy May 2016, elevated CK and aldolase, Gottron's papules, muscle weakness, fatigue, positive Ro 52, positive anti-chromatin: She has not had any signs or symptoms of a dermatomyositis flare.  No recent rashes.  No muscular weakness.  CK was within normal limits: 99 on 12/30/2022.  CK will be rechecked today. Discussed the importance of remaining up-to-date with all age-related cancer  screenings.  Discussed the increased risk for malignancy in patients with dermatomyositis. Patient was advised to notify us if she develops signs or symptoms of a flare.  She will remain on Imuran as prescribed.  A 90-day supply of Imuran will be sent to [facility] pharmacy as requested.  She will follow-up in the office in 3 months or sooner if needed. - Plan: CK, CK, CK  Sjogren's syndrome with keratoconjunctivitis sicca (HCC) -  +ANA, +Ro: She has ongoing sicca symptoms-stable.  She remains on Imuran 100 mg daily.  No synovitis noted on examination today.  No cervical lymphadenopathy was palpable. Lab work from 12/30/22 was reviewed today in the office: ANA and Ro antibody remains positive, La-, SPEP WNL, complements WNL, ESR WNL, and CK WNL.  The following lab work will be updated today for further evaluation.  She was advised to notify us if she develops any new or worsening symptoms. - Plan: C3 and C4, Sedimentation rate, Serum protein electrophoresis with reflex  High risk medication use - Imuran 100 mg by mouth daily. CBC and CMP updated on 06/01/23.  Discussed the importance of holding imuran if she develops signs or symptoms of an infection and to resume once the infection has completely cleared.   - Plan: CBC with Differential/Platelet, COMPLETE METABOLIC PANEL WITH GFR  Primary osteoarthritis of both hands: PIP and DIP thickening consistent with osteoarthritis of both hands.  No synovitis noted.  Complete fist formation noted.  Discussed the importance of joint protection and muscle strengthening.  Primary osteoarthritis of both knees: X-rays of both knees were consistent with moderate osteoarthritis and moderate chondromalacia patella on 11/19/2016.  Patient has had increased discomfort involving both knees, right greater than left.  No recent injury or fall.  On examination she has painful range of motion with crepitus bilaterally.  Mild warmth noted in the right knee.  No effusion noted.   Different treatment options were discussed.  She remains hesitant to proceed with viscosupplementation at this time.  Patient plans on researching viscosupplementation on her own and will notify us if she would like for [place] to apply for Visco gel injections in the future.  Primary osteoarthritis of both feet: Good range of motion of both ankle joints with  no tenderness or synovitis.  She is wearing proper fitting shoes.  Spondylosis of lumbar region without myelopathy or radiculopathy: Limited mobility.    Vitamin D deficiency: Patient is taking vitamin D 2000 units daily.  Other insomnia -Patient continues to have difficulty falling asleep at night despite taking low-dose trazodone 50 mg at bedtime.  She has been taking trazodone 2 hours prior to bedtime but it still has taken her several hours to fall asleep.  Discussed that she can try increasing the dose of trazodone to 75 mg at bedtime.  A prescription will be written for the change in dose.  She was advised to notify us if she continues to have difficulty falling asleep or has any side effects with the increased dose of trazodone.    Other medical conditions are listed as follows:   History of pernicious anemia  Basal cell carcinoma (BCC) of overlapping sites of skin - Followed by dermatology specialists of [facility]  Most recent office visit on 06/23/2022-underwent C&E for several lesions and blade biopsies.  History of hypothyroidism  History of anxiety  Other fatigue  Orders: Orders Placed This Encounter  Procedures   CK   CBC with Differential/Platelet   COMPLETE METABOLIC PANEL WITH GFR   CK   C3 and C4   Sedimentation rate   CK   Serum protein electrophoresis with reflex   No orders of the defined types were placed in this encounter.   Follow-Up Instructions: Return in about 3 months (around 09/09/2023) for Dermatomyositis, Sjogren's syndrome.   Gearldine Bienenstock, PA-C  Note - This record has been created using  Dragon software.  Chart creation errors have been sought, but may not always  have been located. Such creation errors do not reflect on  the standard of medical care.

## 2023-06-04 NOTE — Telephone Encounter (Signed)
Patient has not received RX from [facility] shipped 06/02/2023, patient requesting 30 day sent to [facility].  Patient called requesting refill of Imuran to be sent to [facility].   Last Fill: 03/01/2023   Labs: 06/01/2023 CBC and CMP are normal.   Next Visit: 06/10/2023   Last Visit: 12/30/2022   DX: Dermatomyositis    Current Dose per office note 12/30/2022: Imuran 100 mg by mouth daily    Patient updating labs 06/03/2023. Lab orders released    Okay to refill Imuran?

## 2023-06-08 DIAGNOSIS — E538 Deficiency of other specified B group vitamins: Secondary | ICD-10-CM | POA: Diagnosis not present

## 2023-06-10 ENCOUNTER — Other Ambulatory Visit: Payer: Self-pay

## 2023-06-10 ENCOUNTER — Ambulatory Visit: Payer: Medicare HMO | Attending: Physician Assistant | Admitting: Physician Assistant

## 2023-06-10 ENCOUNTER — Encounter: Payer: Self-pay | Admitting: Physician Assistant

## 2023-06-10 VITALS — BP 138/81 | HR 61 | Resp 16 | Ht 67.0 in | Wt 177.8 lb

## 2023-06-10 DIAGNOSIS — Z862 Personal history of diseases of the blood and blood-forming organs and certain disorders involving the immune mechanism: Secondary | ICD-10-CM

## 2023-06-10 DIAGNOSIS — M170 Bilateral primary osteoarthritis of knee: Secondary | ICD-10-CM

## 2023-06-10 DIAGNOSIS — M3313 Other dermatomyositis without myopathy: Secondary | ICD-10-CM | POA: Diagnosis not present

## 2023-06-10 DIAGNOSIS — Z79899 Other long term (current) drug therapy: Secondary | ICD-10-CM

## 2023-06-10 DIAGNOSIS — G4709 Other insomnia: Secondary | ICD-10-CM

## 2023-06-10 DIAGNOSIS — M19041 Primary osteoarthritis, right hand: Secondary | ICD-10-CM

## 2023-06-10 DIAGNOSIS — E559 Vitamin D deficiency, unspecified: Secondary | ICD-10-CM

## 2023-06-10 DIAGNOSIS — C4481 Basal cell carcinoma of overlapping sites of skin: Secondary | ICD-10-CM

## 2023-06-10 DIAGNOSIS — Z8659 Personal history of other mental and behavioral disorders: Secondary | ICD-10-CM

## 2023-06-10 DIAGNOSIS — Z8639 Personal history of other endocrine, nutritional and metabolic disease: Secondary | ICD-10-CM

## 2023-06-10 DIAGNOSIS — M19042 Primary osteoarthritis, left hand: Secondary | ICD-10-CM

## 2023-06-10 DIAGNOSIS — R5383 Other fatigue: Secondary | ICD-10-CM

## 2023-06-10 DIAGNOSIS — M19072 Primary osteoarthritis, left ankle and foot: Secondary | ICD-10-CM

## 2023-06-10 DIAGNOSIS — M47816 Spondylosis without myelopathy or radiculopathy, lumbar region: Secondary | ICD-10-CM

## 2023-06-10 DIAGNOSIS — M19071 Primary osteoarthritis, right ankle and foot: Secondary | ICD-10-CM

## 2023-06-10 DIAGNOSIS — M3501 Sicca syndrome with keratoconjunctivitis: Secondary | ICD-10-CM

## 2023-06-10 MED ORDER — AZATHIOPRINE 50 MG PO TABS
100.0000 mg | ORAL_TABLET | Freq: Every day | ORAL | 0 refills | Status: DC
Start: 2023-06-10 — End: 2023-09-06

## 2023-06-10 MED ORDER — TRAZODONE HCL 50 MG PO TABS: 75.0000 mg | ORAL_TABLET | Freq: Every day | ORAL | 2 refills | Status: DC

## 2023-06-10 NOTE — Telephone Encounter (Signed)
Please review and sign Imuran and trazodone refills. Trazodone refill reflects dose change to 50mg  tablets 1.5 tablets at bedtime. Thanks!

## 2023-06-10 NOTE — Patient Instructions (Addendum)
Euflexxa and Orthovisc     Standing Labs We placed an order today for your standing lab work.   Please have your standing labs drawn in December and every 3 months   Please have your labs drawn 2 weeks prior to your appointment so that the provider can discuss your lab results at your appointment, if possible.  Please note that you may see your imaging and lab results in MyChart before we have reviewed them. We will contact you once all results are reviewed. Please allow our office up to 72 hours to thoroughly review all of the results before contacting the office for clarification of your results.  WALK-IN LAB HOURS  Monday through Thursday from 8:00 am -12:30 pm and 1:00 pm-5:00 pm and Friday from 8:00 am-12:00 pm.  Patients with office visits requiring labs will be seen before walk-in labs.  You may encounter longer than normal wait times. Please allow additional time. Wait times may be shorter on  Monday and Thursday afternoons.  We do not book appointments for walk-in labs. We appreciate your patience and understanding with our staff.   Labs are drawn by [facility]. Please bring your co-pay at the time of your lab draw.  You may receive a bill from [facility] for your lab work.  Please note if you are on Hydroxychloroquine and and an order has been placed for a Hydroxychloroquine level,  you will need to have it drawn 4 hours or more after your last dose.  If you wish to have your labs drawn at another location, please call the office 24 hours in advance so we can fax the orders.  The office is located at [address], [place], [place] [address]   If you have any questions regarding directions or hours of operation,  please call [phone].   As a reminder, please drink plenty of water prior to coming for your lab work. Thanks!

## 2023-06-15 LAB — PROTEIN ELECTROPHORESIS, SERUM, WITH REFLEX
Albumin ELP: 4.1 g/dL (ref 3.8–4.8)
Alpha 1: 0.3 g/dL (ref 0.2–0.3)
Alpha 2: 0.6 g/dL (ref 0.5–0.9)
Beta 2: 0.4 g/dL (ref 0.2–0.5)
Beta Globulin: 0.5 g/dL (ref 0.4–0.6)
Gamma Globulin: 1 g/dL (ref 0.8–1.7)
Total Protein: 6.8 g/dL (ref 6.1–8.1)

## 2023-06-15 LAB — C3 AND C4
C3 Complement: 163 mg/dL (ref 83–193)
C4 Complement: 25 mg/dL (ref 15–57)

## 2023-06-15 LAB — SEDIMENTATION RATE: Sed Rate: 11 mm/h (ref 0–30)

## 2023-06-15 LAB — CK: Total CK: 92 U/L (ref 29–143)

## 2023-06-15 NOTE — Progress Notes (Signed)
Complements WNL ESR Wnl CK WNL  SPEP did not reveal any abnormal protein bands.

## 2023-06-22 ENCOUNTER — Other Ambulatory Visit: Payer: Self-pay | Admitting: Physician Assistant

## 2023-06-22 DIAGNOSIS — M3313 Other dermatomyositis without myopathy: Secondary | ICD-10-CM

## 2023-07-06 DIAGNOSIS — E538 Deficiency of other specified B group vitamins: Secondary | ICD-10-CM | POA: Diagnosis not present

## 2023-08-10 DIAGNOSIS — E538 Deficiency of other specified B group vitamins: Secondary | ICD-10-CM | POA: Diagnosis not present

## 2023-08-17 DIAGNOSIS — L905 Scar conditions and fibrosis of skin: Secondary | ICD-10-CM | POA: Diagnosis not present

## 2023-08-17 DIAGNOSIS — Z08 Encounter for follow-up examination after completed treatment for malignant neoplasm: Secondary | ICD-10-CM | POA: Diagnosis not present

## 2023-08-17 DIAGNOSIS — C44719 Basal cell carcinoma of skin of left lower limb, including hip: Secondary | ICD-10-CM | POA: Diagnosis not present

## 2023-08-17 DIAGNOSIS — D485 Neoplasm of uncertain behavior of skin: Secondary | ICD-10-CM | POA: Diagnosis not present

## 2023-08-17 DIAGNOSIS — C44619 Basal cell carcinoma of skin of left upper limb, including shoulder: Secondary | ICD-10-CM | POA: Diagnosis not present

## 2023-08-17 DIAGNOSIS — Z85828 Personal history of other malignant neoplasm of skin: Secondary | ICD-10-CM | POA: Diagnosis not present

## 2023-08-17 DIAGNOSIS — L821 Other seborrheic keratosis: Secondary | ICD-10-CM | POA: Diagnosis not present

## 2023-08-21 ENCOUNTER — Other Ambulatory Visit: Payer: Self-pay | Admitting: Physician Assistant

## 2023-08-23 DIAGNOSIS — R0981 Nasal congestion: Secondary | ICD-10-CM | POA: Diagnosis not present

## 2023-08-23 DIAGNOSIS — J069 Acute upper respiratory infection, unspecified: Secondary | ICD-10-CM | POA: Diagnosis not present

## 2023-08-23 NOTE — Telephone Encounter (Signed)
Last Fill: 06/10/2023  Next Visit: 09/13/2023  Last Visit: 06/10/2023  Dx: Other insomnia   Current Dose per office note on 06/10/2023: increasing the dose of trazodone to 75 mg at bedtime   Okay to refill Trazodone?

## 2023-08-24 ENCOUNTER — Telehealth: Payer: Self-pay | Admitting: *Deleted

## 2023-08-24 NOTE — Telephone Encounter (Signed)
Patient contacted the office to ask if she came by the office on 09/09/2023 would we have her results in time for her appointment on 09/13/2023. Patient advised we would. Patient also asked if there was lab work that could be done to test for Fibromyalgia. Patient advised there is not. Patient advised she may discuss the possibility of Fibromyalgia at her follow up visit on 09/13/2023.

## 2023-08-30 NOTE — Progress Notes (Unsigned)
Office Visit Note  Patient: Carmen Woods             Date of Birth: Nov 09, 1955           MRN: [id number]             PCP: Ignatius Specking, MD Referring: Ignatius Specking, MD Visit Date: 09/13/2023 Occupation: @GUAROCC @  Subjective:  Medication monitoring   History of Present Illness: Carmen Woods is a 67 y.o. female with history of dermatomyositis, sjogren's syndrome, and osteoarthritis.  Patient remain on imuran 100 mg by mouth daily.  She is tolerating imuran without any side effects.  She has not had any recent gaps in therapy.  She has not had any signs or symptoms of a myositis flare.  Patient has continued to follow-up with dermatology every 3 months.  Patient states that her knee joint pain has improved since starting glucosamine and fish oil daily.  She denies any joint swelling at this time.  She denies any new or worsening pulmonary symptoms.  She has been sleeping better at night since taking trazodone at bedtime.     Activities of Daily Living:  Patient reports morning stiffness for 2 hours.   Patient Reports nocturnal pain.  Difficulty dressing/grooming: Denies Difficulty climbing stairs: Reports Difficulty getting out of chair: Reports Difficulty using hands for taps, buttons, cutlery, and/or writing: Reports  Review of Systems  Constitutional:  Positive for fatigue.  HENT:  Positive for mouth dryness. Negative for mouth sores.   Eyes:  Positive for dryness.  Respiratory:  Negative for shortness of breath.   Cardiovascular:  Positive for palpitations. Negative for chest pain.  Gastrointestinal:  Positive for constipation and diarrhea. Negative for blood in stool.  Endocrine: Negative for increased urination.  Genitourinary:  Negative for involuntary urination.  Musculoskeletal:  Positive for joint pain, gait problem, joint pain, joint swelling, myalgias, muscle weakness, morning stiffness and myalgias. Negative for muscle tenderness.  Skin:  Positive  for color change and sensitivity to sunlight. Negative for rash and hair loss.  Allergic/Immunologic: Negative for susceptible to infections.  Neurological:  Negative for dizziness and headaches.  Hematological:  Positive for swollen glands.  Psychiatric/Behavioral:  Positive for depressed mood and sleep disturbance. The patient is nervous/anxious.     PMFS History:  Patient Active Problem List   Diagnosis Date Noted   Basal cell carcinoma (BCC) 08/25/2017   Primary osteoarthritis of both hands 03/18/2017   Primary osteoarthritis of both feet 03/18/2017   High risk medication use 11/10/2016   Galtron's Papules 11/10/2016   Other fatigue 11/10/2016   Spondylosis of lumbar region without myelopathy or radiculopathy 11/10/2016   History of anxiety 11/10/2016   Vitamin D deficiency 11/10/2016   History of vertigo 11/10/2016   Sjogren's syndrome with keratoconjunctivitis sicca (HCC) 11/10/2016   History of hypothyroidism 11/10/2016   Dermatomyositis (HCC) 01/07/2015   Mitral regurgitation 01/19/2013   Precordial pain 01/19/2013    Past Medical History:  Diagnosis Date   Basal cell carcinoma    DDD (degenerative disc disease), lumbar    Dermatomyositis (HCC) 12/21/2014   Difficulty swallowing    Dry eyes    Dry mouth    Hypothyroidism    Mass of left thigh 12/21/2014   Pernicious anemia    per patient   Rash    hands, left thigh    Family History  Problem Relation Age of Onset   Heart Problems Mother        Pacermaker  Breast cancer Mother    Diabetes Father    Osteoarthritis Sister    Kidney disease Sister 52   Rheum arthritis Sister    Thyroid disease Sister    Breast cancer Cousin        Maternal cousin   Past Surgical History:  Procedure Laterality Date   BACK SURGERY  1998   BASAL CELL CARCINOMA EXCISION  2023   BELPHAROPTOSIS REPAIR Bilateral 11/2014   MUSCLE BIOPSY Left 01/07/2015   Procedure: LEFT THIGH MUSCLE BIOPSY;  Surgeon: Claud Kelp, MD;   Location: [facility];  Service: General;  Laterality: Left;   SKIN BIOPSY  06/23/2022   5 places removed per patient   skin cancer removal Right 2020   hand   skin cancer removal     skin cancer removal     skin cancer removal   07/2017   Social History   Social History Narrative   Not on file   Immunization History  Administered Date(s) Administered   Influenza,inj,Quad PF,6+ Mos 09/29/2018   Influenza,inj,quad, With Preservative 07/12/2017   Moderna Sars-Covid-2 Vaccination 11/30/2019, 12/27/2019     Objective: Vital Signs: BP 133/78 (BP Location: Left Arm, Patient Position: Sitting, Cuff Size: Normal)   Pulse (!) 55   Resp 14   Ht 5\' 7"  (1.702 m)   Wt 180 lb (81.6 kg)   BMI 28.19 kg/m    Physical Exam Vitals and nursing note reviewed.  Constitutional:      Appearance: She is well-developed.  HENT:     Head: Normocephalic and atraumatic.  Eyes:     Conjunctiva/sclera: Conjunctivae normal.  Cardiovascular:     Rate and Rhythm: Normal rate and regular rhythm.     Heart sounds: Normal heart sounds.  Pulmonary:     Effort: Pulmonary effort is normal.     Breath sounds: Normal breath sounds.  Abdominal:     General: Bowel sounds are normal.     Palpations: Abdomen is soft.  Musculoskeletal:     Cervical back: Normal range of motion.  Lymphadenopathy:     Cervical: No cervical adenopathy.  Skin:    General: Skin is warm and dry.     Capillary Refill: Capillary refill takes less than 2 seconds.  Neurological:     Mental Status: She is alert and oriented to person, place, and time.  Psychiatric:        Behavior: Behavior normal.      Musculoskeletal Exam: C-spine, thoracic spine, and lumbar spine good ROM.  Shoulder joints, elbow joints, wrist joints, MCPs, PIPs, and DIPs good ROM with no synovitis. PIP and DIP thickening.   Complete fist formation bilaterally.  Hip joints have good ROM with no groin pain.  Knee joints have good ROM with no warmth  or effusion.  Ankle joints have good ROM with no tenderness or joint swelling.   CDAI Exam: CDAI Score: -- Patient Global: --; Provider Global: -- Swollen: --; Tender: -- Joint Exam 09/13/2023   No joint exam has been documented for this visit   There is currently no information documented on the homunculus. Go to the Rheumatology activity and complete the homunculus joint exam.  Investigation: No additional findings.  Imaging: No results found.  Recent Labs: Lab Results  Component Value Date   WBC 5.8 09/09/2023   HGB 12.2 09/09/2023   PLT 279 09/09/2023   NA 142 09/09/2023   K 3.9 09/09/2023   CL 107 09/09/2023   CO2 29 09/09/2023  GLUCOSE 98 09/09/2023   BUN 12 09/09/2023   CREATININE 0.67 09/09/2023   BILITOT 0.5 09/09/2023   ALKPHOS 93 06/01/2023   AST 21 09/09/2023   ALT 18 09/09/2023   PROT 6.5 09/09/2023   ALBUMIN 4.4 06/01/2023   CALCIUM 9.6 09/09/2023   GFRAA 108 03/18/2021    Speciality Comments: No specialty comments available.  Procedures:  No procedures performed Allergies: Codeine and Hydrocodone    Assessment / Plan:     Visit Diagnoses: Dermatomyositis (HCC) - Positive biopsy May 2016, elevated CK and aldolase, Gottron's papules, muscle weakness, fatigue, positive Ro 52, positive anti-chromatin: No upper or lower extremity muscle weakness noted at this time.  She has not had any signs or symptoms of a myositis flare.  She has clinically been doing well taking Imuran 100 mg by mouth daily.  She is tolerating Imuran without any side effects and has not had any recent gaps in therapy. Patient remains under the close follow-up with dermatology every 3 months.  No recurrence of Gottron's papules. CK WNL on 09/09/23.  Patient will remain on Imuran as prescribed. Recommend evaluation by pulmonologist to rule out interstitial lung disease.  She was in agreement.  Referral for [facility] will be placed today. Also discussed the importance of  remaining up-to-date with age-related cancer screenings due to the increased risk for malignancy in patients with dermatomyositis. She was vies notify us if she develops any new or worsening symptoms.  She will follow-up in the office in 3 months or sooner if needed.  Sjogren's syndrome with keratoconjunctivitis sicca (HCC) - +ANA, +Ro: She has ongoing sicca symptoms-stable.  No parotid swelling or tenderness noted.  She remains on Imuran 100 mg daily.  Discussed her increased risk for developing lymphoma in patients with Sjogren's syndrome.  Also discussed increased risk of developing ILD in patients with Sjogren's syndrome.  A referral to [facility] will be placed today to rule out ILD.  No signs of inflammatory arthritis noted at this time.  She will remain on imuran as prescribed.   High risk medication use - Imuran 100 mg by mouth daily. CBC and CMP WNL on 09/09/23.  Her next lab work will be due in February and every 3 months.  No recent or recurrent infections. Discussed the importance of holding imuran if she develops signs or symptoms of an infection and to resume once the infection has completely cleared.  Primary osteoarthritis of both hands: She has PIP and DIP thickening consistent with osteoarthritis of both hands.  No synovitis noted on examination today.  She is difficulty with fine motor skills intermittently.  Discussed the importance of joint protection and muscle strengthening.  She has started to take glucosamine and fish oil which have been helpful.  Primary osteoarthritis of both knees - X-rays of both knees were consistent with moderate osteoarthritis and moderate chondromalacia patella on 11/19/2016.  No warmth or effusion noted on examination today.  She has started to notice an improvement in her knee joint pain since initiating glucosamine and fish oil.  Primary osteoarthritis of both feet: She is not experiencing any increased discomfort in her feet at this time.    Spondylosis of lumbar region without myelopathy or radiculopathy: Intermittent discomfort.    Other insomnia: She has been sleeping better at night since taking trazodone 75 mg at bedtime.  Other medical conditions are listed as follows:  Vitamin D deficiency: She is taking vitamin D 2000 units daily.  History of pernicious anemia  Basal cell carcinoma (BCC) of overlapping sites of skin - Followed by [facility]ology [facility].  Most recent office visit on 06/23/2022-underwent C&E for several lesions and blade biopsies.  History of hypothyroidism  History of anxiety   Other fatigue  Orders: No orders of the defined types were placed in this encounter.  No orders of the defined types were placed in this encounter.    Follow-Up Instructions: Return in about 3 months (around 12/12/2023) for Dermatomyositis.   Gearldine Bienenstock, PA-C  Note - This record has been created using Dragon software.  Chart creation errors have been sought, but may not always  have been located. Such creation errors do not reflect on  the standard of medical care.

## 2023-09-01 DIAGNOSIS — C44619 Basal cell carcinoma of skin of left upper limb, including shoulder: Secondary | ICD-10-CM | POA: Diagnosis not present

## 2023-09-01 DIAGNOSIS — C44719 Basal cell carcinoma of skin of left lower limb, including hip: Secondary | ICD-10-CM | POA: Diagnosis not present

## 2023-09-02 DIAGNOSIS — E538 Deficiency of other specified B group vitamins: Secondary | ICD-10-CM | POA: Diagnosis not present

## 2023-09-05 ENCOUNTER — Other Ambulatory Visit: Payer: Self-pay | Admitting: Physician Assistant

## 2023-09-05 DIAGNOSIS — M3313 Other dermatomyositis without myopathy: Secondary | ICD-10-CM

## 2023-09-06 NOTE — Telephone Encounter (Signed)
Last Fill: 06/10/2023  Labs: 06/01/2023  CBC and CMP are normal.   Next Visit: 09/13/2023  Last Visit: 06/10/2023  DX:  Dermatomyositis   Current Dose per office note 06/10/2023: Imuran 100 mg daily   Okay to refill Imuran?

## 2023-09-09 ENCOUNTER — Other Ambulatory Visit: Payer: Self-pay | Admitting: *Deleted

## 2023-09-09 DIAGNOSIS — M3313 Other dermatomyositis without myopathy: Secondary | ICD-10-CM

## 2023-09-09 DIAGNOSIS — Z79899 Other long term (current) drug therapy: Secondary | ICD-10-CM | POA: Diagnosis not present

## 2023-09-10 LAB — CBC WITH DIFFERENTIAL/PLATELET
Absolute Lymphocytes: 1189 {cells}/uL (ref 850–3900)
Absolute Monocytes: 441 {cells}/uL (ref 200–950)
Basophils Absolute: 52 {cells}/uL (ref 0–200)
Basophils Relative: 0.9 %
Eosinophils Absolute: 191 {cells}/uL (ref 15–500)
Eosinophils Relative: 3.3 %
HCT: 36.6 % (ref 35.0–45.0)
Hemoglobin: 12.2 g/dL (ref 11.7–15.5)
MCH: 29.3 pg (ref 27.0–33.0)
MCHC: 33.3 g/dL (ref 32.0–36.0)
MCV: 88 fL (ref 80.0–100.0)
MPV: 10.3 fL (ref 7.5–12.5)
Monocytes Relative: 7.6 %
Neutro Abs: 3927 {cells}/uL (ref 1500–7800)
Neutrophils Relative %: 67.7 %
Platelets: 279 10*3/uL (ref 140–400)
RBC: 4.16 10*6/uL (ref 3.80–5.10)
RDW: 12.3 % (ref 11.0–15.0)
Total Lymphocyte: 20.5 %
WBC: 5.8 10*3/uL (ref 3.8–10.8)

## 2023-09-10 LAB — COMPLETE METABOLIC PANEL WITH GFR
AG Ratio: 1.6 (calc) (ref 1.0–2.5)
ALT: 18 U/L (ref 6–29)
AST: 21 U/L (ref 10–35)
Albumin: 4 g/dL (ref 3.6–5.1)
Alkaline phosphatase (APISO): 82 U/L (ref 37–153)
BUN: 12 mg/dL (ref 7–25)
CO2: 29 mmol/L (ref 20–32)
Calcium: 9.6 mg/dL (ref 8.6–10.4)
Chloride: 107 mmol/L (ref 98–110)
Creat: 0.67 mg/dL (ref 0.50–1.05)
Globulin: 2.5 g/dL (ref 1.9–3.7)
Glucose, Bld: 98 mg/dL (ref 65–99)
Potassium: 3.9 mmol/L (ref 3.5–5.3)
Sodium: 142 mmol/L (ref 135–146)
Total Bilirubin: 0.5 mg/dL (ref 0.2–1.2)
Total Protein: 6.5 g/dL (ref 6.1–8.1)
eGFR: 96 mL/min/{1.73_m2} (ref >=60)

## 2023-09-10 LAB — CK: Total CK: 131 U/L (ref 29–143)

## 2023-09-10 NOTE — Progress Notes (Signed)
CBC and CMP WNL  CK WNL. We will continue to monitor lab work closely.

## 2023-09-13 ENCOUNTER — Encounter: Payer: Self-pay | Admitting: Physician Assistant

## 2023-09-13 ENCOUNTER — Ambulatory Visit: Payer: Medicare HMO | Attending: Physician Assistant | Admitting: Physician Assistant

## 2023-09-13 VITALS — BP 133/78 | HR 55 | Resp 14 | Ht 67.0 in | Wt 180.0 lb

## 2023-09-13 DIAGNOSIS — M170 Bilateral primary osteoarthritis of knee: Secondary | ICD-10-CM | POA: Diagnosis not present

## 2023-09-13 DIAGNOSIS — C4481 Basal cell carcinoma of overlapping sites of skin: Secondary | ICD-10-CM

## 2023-09-13 DIAGNOSIS — Z79899 Other long term (current) drug therapy: Secondary | ICD-10-CM | POA: Diagnosis not present

## 2023-09-13 DIAGNOSIS — M3313 Other dermatomyositis without myopathy: Secondary | ICD-10-CM

## 2023-09-13 DIAGNOSIS — Z862 Personal history of diseases of the blood and blood-forming organs and certain disorders involving the immune mechanism: Secondary | ICD-10-CM | POA: Diagnosis not present

## 2023-09-13 DIAGNOSIS — M3501 Sicca syndrome with keratoconjunctivitis: Secondary | ICD-10-CM | POA: Diagnosis not present

## 2023-09-13 DIAGNOSIS — Z8659 Personal history of other mental and behavioral disorders: Secondary | ICD-10-CM

## 2023-09-13 DIAGNOSIS — M19041 Primary osteoarthritis, right hand: Secondary | ICD-10-CM

## 2023-09-13 DIAGNOSIS — R5383 Other fatigue: Secondary | ICD-10-CM

## 2023-09-13 DIAGNOSIS — M19071 Primary osteoarthritis, right ankle and foot: Secondary | ICD-10-CM | POA: Diagnosis not present

## 2023-09-13 DIAGNOSIS — M47816 Spondylosis without myelopathy or radiculopathy, lumbar region: Secondary | ICD-10-CM

## 2023-09-13 DIAGNOSIS — M19042 Primary osteoarthritis, left hand: Secondary | ICD-10-CM

## 2023-09-13 DIAGNOSIS — Z8639 Personal history of other endocrine, nutritional and metabolic disease: Secondary | ICD-10-CM

## 2023-09-13 DIAGNOSIS — G4709 Other insomnia: Secondary | ICD-10-CM

## 2023-09-13 DIAGNOSIS — E559 Vitamin D deficiency, unspecified: Secondary | ICD-10-CM

## 2023-09-13 DIAGNOSIS — M19072 Primary osteoarthritis, left ankle and foot: Secondary | ICD-10-CM

## 2023-09-13 NOTE — Patient Instructions (Signed)

## 2023-09-16 ENCOUNTER — Telehealth: Payer: Self-pay | Admitting: *Deleted

## 2023-09-16 DIAGNOSIS — M3313 Other dermatomyositis without myopathy: Secondary | ICD-10-CM

## 2023-09-16 DIAGNOSIS — M3501 Sicca syndrome with keratoconjunctivitis: Secondary | ICD-10-CM

## 2023-09-16 DIAGNOSIS — Z79899 Other long term (current) drug therapy: Secondary | ICD-10-CM

## 2023-09-16 NOTE — Telephone Encounter (Signed)
Referral placed per Taylor Dale, PA-C 

## 2023-09-20 DIAGNOSIS — C44619 Basal cell carcinoma of skin of left upper limb, including shoulder: Secondary | ICD-10-CM | POA: Diagnosis not present

## 2023-09-20 DIAGNOSIS — C44719 Basal cell carcinoma of skin of left lower limb, including hip: Secondary | ICD-10-CM | POA: Diagnosis not present

## 2023-10-11 DIAGNOSIS — E538 Deficiency of other specified B group vitamins: Secondary | ICD-10-CM | POA: Diagnosis not present

## 2023-10-28 ENCOUNTER — Other Ambulatory Visit: Payer: Self-pay | Admitting: *Deleted

## 2023-10-28 NOTE — Telephone Encounter (Signed)
 Error

## 2023-10-29 DIAGNOSIS — I10 Essential (primary) hypertension: Secondary | ICD-10-CM | POA: Diagnosis not present

## 2023-10-29 DIAGNOSIS — Z299 Encounter for prophylactic measures, unspecified: Secondary | ICD-10-CM | POA: Diagnosis not present

## 2023-10-29 DIAGNOSIS — D692 Other nonthrombocytopenic purpura: Secondary | ICD-10-CM | POA: Diagnosis not present

## 2023-10-29 DIAGNOSIS — K219 Gastro-esophageal reflux disease without esophagitis: Secondary | ICD-10-CM | POA: Diagnosis not present

## 2023-10-29 DIAGNOSIS — D849 Immunodeficiency, unspecified: Secondary | ICD-10-CM | POA: Diagnosis not present

## 2023-11-04 DIAGNOSIS — K5730 Diverticulosis of large intestine without perforation or abscess without bleeding: Secondary | ICD-10-CM | POA: Diagnosis not present

## 2023-11-04 DIAGNOSIS — K5900 Constipation, unspecified: Secondary | ICD-10-CM | POA: Diagnosis not present

## 2023-11-04 DIAGNOSIS — K219 Gastro-esophageal reflux disease without esophagitis: Secondary | ICD-10-CM | POA: Diagnosis not present

## 2023-11-12 DIAGNOSIS — E538 Deficiency of other specified B group vitamins: Secondary | ICD-10-CM | POA: Diagnosis not present

## 2023-11-15 DIAGNOSIS — D485 Neoplasm of uncertain behavior of skin: Secondary | ICD-10-CM | POA: Diagnosis not present

## 2023-11-15 DIAGNOSIS — L821 Other seborrheic keratosis: Secondary | ICD-10-CM | POA: Diagnosis not present

## 2023-11-15 DIAGNOSIS — D0461 Carcinoma in situ of skin of right upper limb, including shoulder: Secondary | ICD-10-CM | POA: Diagnosis not present

## 2023-11-15 DIAGNOSIS — Z85828 Personal history of other malignant neoplasm of skin: Secondary | ICD-10-CM | POA: Diagnosis not present

## 2023-11-15 DIAGNOSIS — C44619 Basal cell carcinoma of skin of left upper limb, including shoulder: Secondary | ICD-10-CM | POA: Diagnosis not present

## 2023-11-15 DIAGNOSIS — L905 Scar conditions and fibrosis of skin: Secondary | ICD-10-CM | POA: Diagnosis not present

## 2023-11-16 ENCOUNTER — Other Ambulatory Visit: Payer: Self-pay | Admitting: *Deleted

## 2023-11-16 MED ORDER — TRAZODONE HCL 50 MG PO TABS: 75.0000 mg | ORAL_TABLET | Freq: Every day | ORAL | 2 refills | Status: DC

## 2023-11-16 NOTE — Telephone Encounter (Signed)
 Refill request received via fax from [facility] for Trazodone   Last Fill: 08/23/2023  Next Visit: 12/15/2023  Last Visit: 09/13/2023  Dx: Other insomnia   Current Dose per office note on 09/13/2023: trazodone 75 mg at bedtime   Okay to refill Trazodone?

## 2023-11-17 ENCOUNTER — Encounter: Payer: Self-pay | Admitting: Internal Medicine

## 2023-11-17 ENCOUNTER — Ambulatory Visit: Payer: Medicare Other | Admitting: Internal Medicine

## 2023-11-17 VITALS — BP 128/68 | HR 60 | Ht 64.0 in | Wt 179.2 lb

## 2023-11-17 DIAGNOSIS — M3313 Other dermatomyositis without myopathy: Secondary | ICD-10-CM

## 2023-11-17 DIAGNOSIS — J302 Other seasonal allergic rhinitis: Secondary | ICD-10-CM | POA: Diagnosis not present

## 2023-11-17 DIAGNOSIS — R0609 Other forms of dyspnea: Secondary | ICD-10-CM | POA: Diagnosis not present

## 2023-11-17 DIAGNOSIS — Z79899 Other long term (current) drug therapy: Secondary | ICD-10-CM

## 2023-11-17 DIAGNOSIS — R238 Other skin changes: Secondary | ICD-10-CM

## 2023-11-17 DIAGNOSIS — M3501 Sicca syndrome with keratoconjunctivitis: Secondary | ICD-10-CM

## 2023-11-17 DIAGNOSIS — R0789 Other chest pain: Secondary | ICD-10-CM

## 2023-11-17 LAB — CBC WITH DIFFERENTIAL/PLATELET
Basophils Absolute: 0 10*3/uL (ref 0.0–0.1)
Basophils Relative: 1.2 % (ref 0.0–3.0)
Eosinophils Absolute: 0.2 10*3/uL (ref 0.0–0.7)
Eosinophils Relative: 4.9 % (ref 0.0–5.0)
HCT: 38.1 % (ref 36.0–46.0)
Hemoglobin: 13.1 g/dL (ref 12.0–15.0)
Lymphocytes Relative: 20.3 % (ref 12.0–46.0)
Lymphs Abs: 0.8 10*3/uL (ref 0.7–4.0)
MCHC: 34.2 g/dL (ref 30.0–36.0)
MCV: 87.7 fL (ref 78.0–100.0)
Monocytes Absolute: 0.3 10*3/uL (ref 0.1–1.0)
Monocytes Relative: 7.5 % (ref 3.0–12.0)
Neutro Abs: 2.7 10*3/uL (ref 1.4–7.7)
Neutrophils Relative %: 66.1 % (ref 43.0–77.0)
Platelets: 226 10*3/uL (ref 150.0–400.0)
RBC: 4.35 Mil/uL (ref 3.87–5.11)
RDW: 13.4 % (ref 11.5–15.5)
WBC: 4.1 10*3/uL (ref 4.0–10.5)

## 2023-11-17 LAB — BRAIN NATRIURETIC PEPTIDE: Pro B Natriuretic peptide (BNP): 44 pg/mL (ref 0.0–100.0)

## 2023-11-17 NOTE — Progress Notes (Signed)
 OV 11/17/2023  Subjective:  Patient ID: Carmen Woods, female , DOB: 08/30/1956 , age 68 y.o. , MRN: [id number] , ADDRESS: [address] [place] [address] PCP Ignatius Specking, MD Patient Care Team: Ignatius Specking, MD as PCP - General (Internal Medicine)  This Provider for this visit: Treatment Team:  Attending Provider: Kalman Shan, MD    11/17/2023 -   Chief Complaint  Patient presents with   Pulmonary Consult    Referred by Sherron Ales, PA for eval of ILD. Pt has DOE with walking "not even half a mile" and in the am when she wakes up "feels like a weight on my chest". Symptoms started about 2 years ago.      HPI [place] 68 y.o. -referred by Sherron Ales physician assistant at rheumatology.  I reviewed those external records.  History is provided through that and talking to the patient.  [place]  Diagnosed with both dermatomyositis with Gottron papules and also Sjogren's based on antibody profile.  She is on Imuran for several years.  When first diagnosed in 2016/2017 she was on prednisone and since then prednisone intermittently.  No prednisone in the last 1 year.  She has ongoing arthralgia from the hands ankles knees and the right hip.  She is ongoing rash.  But she is here because she has insidious onset of shortness of breath relieved by exertion for the last few years.  Severity score is below.  It is definitely not worse since it started it is stable.  There is also associated chest heaviness when she wakes up.  There is no wheezing but she does have a mild cough and she is on daily seasonal allergy antihistamine.  She does have seasonal allergies.  She was told by Sherron Ales to establish a baseline with's because of the high propensity for pulmonary issues with autoimmune/connective tissue disease.  Despite the high level symptom burden she did normal with a simple exercise hypoxemia test.     SYMPTOM  SCALE - ILD 11/17/2023  Current weight   O2 use ra  Shortness of Breath 0 -> 5 scale with 5 being worst (score 6 If unable to do)  At rest 0  Simple tasks - showers, clothes change, eating, shaving 2  Household (dishes, doing bed, laundry) 3  Shopping 3  Walking level at own pace 2  Walking up Stairs 3  Total (30-36) Dyspnea Score 13  How bad is your cough? 1  How bad is your fatigue 4  How bad is nausea 2  How bad is vomiting?  0  How bad is diarrhea? 2  How bad is anxiety? 4  How bad is depression 4  Any chronic pain - if so where and how bad 4     Latest Reference Range & Units 06/01/23 11:38  EOS (ABSOLUTE) 0.0 - 0.4 x10E3/uL 0.3    Simple office walk 224 (66+46 x 2) feet Pod A at [facility] x  3 laps goal with forehead probe 11/17/2023    O2 used ra   Number laps completed Sit stand x 15   Comments about pace normal   Resting Pulse Ox/HR 99% and 59/min   Final Pulse Ox/HR 99% and 85/min   Desaturated </= 88% n   Desaturated <= 3% points no   Got Tachycardic >/= 90/min no   Symptoms at end of test no   Miscellaneous comments normal  Okay   CXR 2016: CLEAR   Blood labs Dec 2024 L hgb 12.2gm% and normal creat 0.67mg % PFT      No data to display             LAB RESULTS last 96 hours No results found.       has a past medical history of Basal cell carcinoma, DDD (degenerative disc disease), lumbar, Dermatomyositis (HCC) (12/21/2014), Difficulty swallowing, Dry eyes, Dry mouth, Hypothyroidism, Mass of left thigh (12/21/2014), Pernicious anemia, and Rash.   reports that she has never smoked. She has been exposed to tobacco smoke. She has never used smokeless tobacco.  Past Surgical History:  Procedure Laterality Date   BACK SURGERY  1998   BASAL CELL CARCINOMA EXCISION  2023   BELPHAROPTOSIS REPAIR Bilateral 11/2014   MUSCLE BIOPSY Left 01/07/2015   Procedure: LEFT THIGH MUSCLE BIOPSY;  Surgeon: Claud Kelp, MD;  Location: [facility];  Service: General;  Laterality: Left;   SKIN BIOPSY  06/23/2022   5 places removed per patient   skin cancer removal Right 2020   hand   skin cancer removal     skin cancer removal     skin cancer removal   07/2017    Allergies  Allergen Reactions   Codeine Nausea And Vomiting   Hydrocodone Nausea And Vomiting    Immunization History  Administered Date(s) Administered   Influenza,inj,Quad PF,6+ Mos 09/29/2018   Influenza,inj,quad, With Preservative 07/12/2017   Moderna Sars-Covid-2 Vaccination 11/30/2019, 12/27/2019    Family History  Problem Relation Age of Onset   Heart Problems Mother        Pacermaker   Breast cancer Mother    COPD Mother        never smoked, exp to second hand smoke   Diabetes Father    Osteoarthritis Sister    Kidney disease Sister 37   Rheum arthritis Sister    Thyroid disease Sister    Breast cancer Cousin        Maternal cousin     Current Outpatient Medications:    Acetaminophen (TYLENOL ARTHRITIS PAIN PO), Take by mouth as needed., Disp: , Rfl:    azaTHIOprine (IMURAN) 50 MG tablet, TAKE 2 TABLETS EVERY DAY, Disp: 180 tablet, Rfl: 0   baclofen (LIORESAL) 20 MG tablet, Take 20 mg by mouth 3 (three) times daily as needed., Disp: , Rfl:    BIOTIN PO, Take 500 mcg by mouth daily., Disp: , Rfl:    Calcium Carb-Cholecalciferol (CALCIUM 600 + D PO), Take by mouth daily., Disp: , Rfl:    cetirizine (ZYRTEC ALLERGY) 10 MG tablet, Take 10 mg by mouth daily., Disp: , Rfl:    cyanocobalamin (VITAMIN B12) 1000 MCG/ML injection, Inject into the muscle every 30 (thirty) days., Disp: , Rfl:    escitalopram (LEXAPRO) 20 MG tablet, Take 20 mg by mouth daily., Disp: , Rfl:    Glucosamine-Chondroitin-MSM 500-200-150 MG TABS, Take by mouth., Disp: , Rfl:    imiquimod (ALDARA) 5 % cream, Apply topically., Disp: , Rfl:    levothyroxine (SYNTHROID) 88 MCG tablet, Take 88 mcg by mouth daily., Disp: , Rfl:    Multiple Vitamins-Minerals (HAIR SKIN AND  NAILS FORMULA PO), Take by mouth daily., Disp: , Rfl:    Multiple Vitamins-Minerals (MULTIVITAL PO), Take by mouth daily., Disp: , Rfl:    Omega-3 Fatty Acids (FISH OIL PO), Take by mouth., Disp: , Rfl:    omeprazole (PRILOSEC) 40 MG capsule, Take 40 mg  by mouth daily., Disp: , Rfl: 1   traZODone (DESYREL) 50 MG tablet, Take 1.5 tablets (75 mg total) by mouth at bedtime., Disp: 45 tablet, Rfl: 2   Vitamin D, Ergocalciferol, 2000 units CAPS, Take by mouth daily., Disp: , Rfl:   Current Facility-Administered Medications:    triamcinolone acetonide (KENALOG) 10 MG/ML injection 10 mg, 10 mg, Other, Once, [place], [place], DPM      Objective:   Vitals:   11/17/23 1035 11/17/23 1037  BP:  128/68  Pulse: 60   SpO2: 99%   Weight:  179 lb 3.2 oz (81.3 kg)  Height:  5\' 4"  (1.626 m)    Estimated body mass index is 30.76 kg/m as calculated from the following:   Height as of this encounter: 5\' 4"  (1.626 m).   Weight as of this encounter: 179 lb 3.2 oz (81.3 kg).  @WEIGHTCHANGE @  [employer]   11/17/23 1037  Weight: 179 lb 3.2 oz (81.3 kg)     Physical Exam   General: No distress. Looks well O2 at rest: no Cane present: no Sitting in wheel chair: no Frail: no Obese: no Neuro: Alert and Oriented x 3. GCS 15. Speech normal Psych: Pleasant Resp:  Barrel Chest - no.  Wheeze - no, Crackles - no, No overt respiratory distress CVS: Normal heart sounds. Murmurs - no Ext: Stigmata of Connective Tissue Disease - GOTTRON PAPULES HEENT: Normal upper airway. PEERL +. No post nasal drip        Assessment:       ICD-10-CM   1. DOE (dyspnea on exertion)  R06.09 Pulmonary function test    Perennial allergen profile IgE    CBC w/Diff    CT Chest High Resolution    B Nat Peptide    ECHOCARDIOGRAM COMPLETE    2. Chest heaviness  R07.89 Pulmonary function test    Perennial allergen profile IgE    CBC w/Diff    CT Chest High Resolution    B Nat Peptide    ECHOCARDIOGRAM COMPLETE     3. Dermatomyositis (HCC)  M33.13 Pulmonary function test    Perennial allergen profile IgE    CBC w/Diff    CT Chest High Resolution    B Nat Peptide    ECHOCARDIOGRAM COMPLETE    4. Sjogren's syndrome with keratoconjunctivitis sicca (HCC)  M35.01 Pulmonary function test    Perennial allergen profile IgE    CBC w/Diff    CT Chest High Resolution    B Nat Peptide    ECHOCARDIOGRAM COMPLETE    5. Seasonal allergies  J30.2 Pulmonary function test    Perennial allergen profile IgE    CBC w/Diff    CT Chest High Resolution    B Nat Peptide    ECHOCARDIOGRAM COMPLETE    6. Galtron's Papules  R23.8 Pulmonary function test    Perennial allergen profile IgE    CBC w/Diff    CT Chest High Resolution    B Nat Peptide    ECHOCARDIOGRAM COMPLETE    7. High risk medication use  Z79.899 Pulmonary function test    Perennial allergen profile IgE    CBC w/Diff    CT Chest High Resolution    B Nat Peptide    ECHOCARDIOGRAM COMPLETE         Plan:     Patient Instructions     ICD-10-CM   1. DOE (dyspnea on exertion)  R06.09     2. Chest heaviness  R07.89  3. Dermatomyositis (HCC)  M33.13     4. Sjogren's syndrome with keratoconjunctivitis sicca (HCC)  M35.01     5. Seasonal allergies  J30.2     6. Galtron's Papules  R23.8     7. High risk medication use  Z79.899      You could be short of breath for routine reasons such as weight, physical deconditioning and stiff heart muscle.  On the other hand you could have developed interstitial lung disease and/or pulmonary hypertension because of your autoimmune disease.  Unclear what the chest heaviness as it could be because of acid reflux potentially OR allergiy oriented airway spasm assocteid with cough  Plan - Do full pulmonary function test - Do high-resolution CT chest supine and prone - Do echocardiogram  - all above [facility] be done at [facility] - Do RAST allergy panel and ccbc with diff 11/17/2023   Followup  - video  vsiti first availa fter test in 6 weeks to discus above   FOLLOWUP Return in about 6 weeks (around 12/29/2023) for 15 min visit, with Dr Marchelle Gearing, VIDEO VISIT to discuss reults.    SIGNATURE    Dr. Kalman Shan, M.D., F.C.C.P,  Pulmonary and Critical Care Medicine Staff Physician, [facility] Director - Interstitial Lung Disease  Program  Pulmonary Fibrosis [facility] [place], [place], [address]  Pager: [phone], If no answer or between  15:00h - 7:00h: call [phone] Telephone: [phone]  11:05 AM 11/17/2023

## 2023-11-17 NOTE — Patient Instructions (Addendum)
 ICD-10-CM   1. DOE (dyspnea on exertion)  R06.09     2. Chest heaviness  R07.89     3. Dermatomyositis (HCC)  M33.13     4. Sjogren's syndrome with keratoconjunctivitis sicca (HCC)  M35.01     5. Seasonal allergies  J30.2     6. Galtron's Papules  R23.8     7. High risk medication use  Z79.899      You could be short of breath for routine reasons such as weight, physical deconditioning and stiff heart muscle.  On the other hand you could have developed interstitial lung disease and/or pulmonary hypertension because of your autoimmune disease.  Unclear what the chest heaviness as it could be because of acid reflux potentially OR allergiy oriented airway spasm assocteid with cough  Plan - Do full pulmonary function test - Do high-resolution CT chest supine and prone - Do echocardiogram  - all above can be done at [facility] - Do RAST allergy panel and ccbc with diff 11/17/2023   Followup  - video vsiti first availa fter test in 6 weeks to discus above

## 2023-11-20 HISTORY — PX: OTHER SURGICAL HISTORY: SHX169

## 2023-11-20 LAB — ALLERGEN PROFILE, PERENNIAL ALLERGEN IGE

## 2023-11-24 NOTE — Progress Notes (Signed)
 it is normal result. Thanks MR

## 2023-11-25 DIAGNOSIS — R0981 Nasal congestion: Secondary | ICD-10-CM | POA: Diagnosis not present

## 2023-11-25 DIAGNOSIS — Z299 Encounter for prophylactic measures, unspecified: Secondary | ICD-10-CM | POA: Diagnosis not present

## 2023-11-29 ENCOUNTER — Telehealth: Payer: Self-pay | Admitting: *Deleted

## 2023-11-29 NOTE — Telephone Encounter (Signed)
 FYI - Patient needs new authorization and refill on Imuran, patient has new insurance. Thank you.

## 2023-11-30 ENCOUNTER — Other Ambulatory Visit: Payer: Self-pay | Admitting: *Deleted

## 2023-11-30 DIAGNOSIS — M3313 Other dermatomyositis without myopathy: Secondary | ICD-10-CM

## 2023-11-30 MED ORDER — AZATHIOPRINE 50 MG PO TABS: 100.0000 mg | ORAL_TABLET | Freq: Every day | ORAL | 0 refills | Status: DC

## 2023-11-30 NOTE — Telephone Encounter (Signed)
 Last Fill: 09/06/2023  Labs: 11/17/2023 CBC 09/09/2023 CBC and CMP WNL CK WNL. We will continue to monitor lab work closely.  Next Visit: 12/09/2023  Last Visit: 09/13/2023  DX: Dermatomyositis    Current Dose per office note 09/13/2023: Imuran 100 mg daily   Okay to refill Imuran?

## 2023-12-01 NOTE — Progress Notes (Signed)
 Office Visit Note  Patient: Carmen Woods             Date of Birth: 1956/01/07           MRN: [id number]             PCP: Ignatius Specking, MD Referring: Ignatius Specking, MD Visit Date: 12/09/2023 Occupation: @GUAROCC @  Subjective:  Pain in both knees   History of Present Illness: Carmen Woods is a 68 y.o. female with history of dermatomyositis and sjogren's syndrome.  Patient remains on Imuran 100 mg by mouth daily.  She is tolerating Imuran without any side effects.  She has not had any recent gaps in therapy.  She has not had any signs or symptoms of a myositis flare.  No muscular weakness.  Patient has been following up closely with dermatology due to squamous cell carcinomas and basal cell carcinomas on her upper and lower extremities.  No new rashes.  Patient was having some shortness of breath on exertion at her last office visit so referral to pulmonology was placed.  Patient established care with Dr. Marchelle Gearing and has had a high-resolution chest CT on 12/04/2023 and an echocardiogram performed yesterday on 12/08/2023.  She is scheduled for PFTs on 12/14/2023. Patient continues to have chronic sicca symptoms.  Patient has been seeing the dentist every 6 months but is planning on increasing frequency to every 4 months.  She is also under the care of [facility] and has been using Systane eyedrops for dry eyes. Patient continues to have chronic pain in both knee joints.  She is been having significant difficulty rising from a seated position and climbing steps.  She denies any warmth or swelling in her knees.  She denies any improvement with use of Voltaren gel.  She uses trying a gel and a heating pad as needed for pain relief.  Patient remains apprehensive to proceed with viscosupplementation but is considering it in the future.    Activities of Daily Living:  Patient reports morning stiffness for 1-1.5 hours.   Patient Reports nocturnal pain.  Difficulty  dressing/grooming: Denies Difficulty climbing stairs: Reports Difficulty getting out of chair: Denies Difficulty using hands for taps, buttons, cutlery, and/or writing: Reports  Review of Systems  Constitutional:  Positive for fatigue.  HENT:  Positive for mouth dryness. Negative for mouth sores.   Eyes:  Positive for dryness.  Respiratory:  Positive for shortness of breath.   Cardiovascular:  Positive for chest pain and palpitations.  Gastrointestinal:  Positive for constipation and diarrhea. Negative for blood in stool.  Endocrine: Negative for increased urination.  Genitourinary:  Negative for involuntary urination.  Musculoskeletal:  Positive for joint pain, gait problem, joint pain, joint swelling, muscle weakness, morning stiffness and muscle tenderness. Negative for myalgias and myalgias.  Skin:  Positive for color change, hair loss and sensitivity to sunlight. Negative for rash.  Allergic/Immunologic: Negative for susceptible to infections.  Neurological:  Positive for dizziness. Negative for headaches.  Hematological:  Positive for swollen glands.  Psychiatric/Behavioral:  Positive for sleep disturbance. Negative for depressed mood. The patient is nervous/anxious.     PMFS History:  Patient Active Problem List   Diagnosis Date Noted   Basal cell carcinoma (BCC) 08/25/2017   Primary osteoarthritis of both hands 03/18/2017   Primary osteoarthritis of both feet 03/18/2017   High risk medication use 11/10/2016   Galtron's Papules 11/10/2016   Other fatigue 11/10/2016   Spondylosis of lumbar region without  myelopathy or radiculopathy 11/10/2016   History of anxiety 11/10/2016   Vitamin D deficiency 11/10/2016   History of vertigo 11/10/2016   Sjogren's syndrome with keratoconjunctivitis sicca (HCC) 11/10/2016   History of hypothyroidism 11/10/2016   Dermatomyositis (HCC) 01/07/2015   Mitral regurgitation 01/19/2013   Precordial pain 01/19/2013    Past Medical History:   Diagnosis Date   Basal cell carcinoma    DDD (degenerative disc disease), lumbar    Dermatomyositis (HCC) 12/21/2014   Difficulty swallowing    Dry eyes    Dry mouth    Hypothyroidism    Mass of left thigh 12/21/2014   Pernicious anemia    per patient   Rash    hands, left thigh    Family History  Problem Relation Age of Onset   Heart Problems Mother        Pacermaker   Breast cancer Mother    COPD Mother        never smoked, exp to second hand smoke   Diabetes Father    Osteoarthritis Sister    Kidney disease Sister 53   Rheum arthritis Sister    Thyroid disease Sister    Breast cancer Cousin        Maternal cousin   Past Surgical History:  Procedure Laterality Date   BACK SURGERY  1998   BASAL CELL CARCINOMA EXCISION  2023   BELPHAROPTOSIS REPAIR Bilateral 11/2014   MUSCLE BIOPSY Left 01/07/2015   Procedure: LEFT THIGH MUSCLE BIOPSY;  Surgeon: Claud Kelp, MD;  Location: [facility];  Service: General;  Laterality: Left;   SKIN BIOPSY  06/23/2022   5 places removed per patient   skin cancer removal Right 2020   hand   skin cancer removal     skin cancer removal     skin cancer removal   07/2017   Social History   Social History Narrative   Not on file   Immunization History  Administered Date(s) Administered   Influenza,inj,Quad PF,6+ Mos 09/29/2018   Influenza,inj,quad, With Preservative 07/12/2017   Moderna Sars-Covid-2 Vaccination 11/30/2019, 12/27/2019     Objective: Vital Signs: BP 138/74 (BP Location: Left Arm, Patient Position: Sitting, Cuff Size: Large)   Pulse (!) 55   Resp 14   Ht 5\' 7"  (1.702 m)   Wt 180 lb (81.6 kg)   BMI 28.19 kg/m    Physical Exam Vitals and nursing note reviewed.  Constitutional:      Appearance: She is well-developed.  HENT:     Head: Normocephalic and atraumatic.  Eyes:     Conjunctiva/sclera: Conjunctivae normal.  Cardiovascular:     Rate and Rhythm: Normal rate and regular rhythm.      Heart sounds: Normal heart sounds.  Pulmonary:     Effort: Pulmonary effort is normal.     Breath sounds: Normal breath sounds.  Abdominal:     General: Bowel sounds are normal.     Palpations: Abdomen is soft.  Musculoskeletal:     Cervical back: Normal range of motion.  Lymphadenopathy:     Cervical: No cervical adenopathy.  Skin:    General: Skin is warm and dry.     Capillary Refill: Capillary refill takes less than 2 seconds.  Neurological:     Mental Status: She is alert and oriented to person, place, and time.  Psychiatric:        Behavior: Behavior normal.      Musculoskeletal Exam: C-spine, thoracic spine, lumbar spine and good  range of motion.  Shoulder joints, elbow joints, wrist joints have good range of motion with no synovitis.  PIP and DIP thickening consistent with osteoarthritis of both hands.  Complete fist formation noted.  No synovitis over MCP joints noted.  Hip joints have good range of motion with no groin pain.  Knee joints have good range of motion with crepitus bilaterally.  No warmth or effusion of knee joints noted.  Ankle joints have good range of motion with no tenderness or joint swelling.  Mild pedal edema noted in bilateral lower extremities.  CDAI Exam: CDAI Score: -- Patient Global: --; Provider Global: -- Swollen: --; Tender: -- Joint Exam 12/09/2023   No joint exam has been documented for this visit   There is currently no information documented on the homunculus. Go to the Rheumatology activity and complete the homunculus joint exam.  Investigation: No additional findings.  Imaging: ECHOCARDIOGRAM COMPLETE Result Date: 12/08/2023    ECHOCARDIOGRAM REPORT   Patient Name:   LATARA MICHELI Date of Exam: 12/08/2023 Medical Rec #:  [id number]            Height:       64.0 in Accession #:    [id number]           Weight:       179.2 lb Date of Birth:  [birth year]           BSA:          1.867 m Patient Age:    67 years             BP:            147/74 mmHg Patient Gender: F                    HR:           63 bpm. [facility] Procedure: 2D Echo, Cardiac Doppler and Color Doppler (Both Spectral and Color            Flow Doppler were utilized during procedure). Indications:    Dyspnea  History:        Patient has prior history of Echocardiogram examinations, most                 recent 12/26/2012.  Sonographer:    Amy Chionchio Referring Phys: [id number] MURALI RAMASWAMY IMPRESSIONS  1. Left ventricular ejection fraction, by estimation, is 60 to 65%. The left ventricle has normal function. The left ventricle has no regional wall motion abnormalities. There is mild concentric left ventricular hypertrophy. Left ventricular diastolic parameters are consistent with Grade I diastolic dysfunction (impaired relaxation).  2. Right ventricular systolic function is normal. The right ventricular size is normal. There is normal pulmonary artery systolic pressure. The estimated right ventricular systolic pressure is 29.0 mmHg.  3. Left atrial size was mildly dilated.  4. The mitral valve is grossly normal. Trivial mitral valve regurgitation.  5. The aortic valve is tricuspid. Aortic valve regurgitation is not visualized. Aortic valve mean gradient measures 3.0 mmHg.  6. The inferior vena cava is normal in size with greater than 50% respiratory variability, suggesting right atrial pressure of 3 mmHg. Comparison(s): No prior Echocardiogram. FINDINGS  Left Ventricle: Left ventricular ejection fraction, by estimation, is 60 to 65%. The left ventricle has normal function. The left ventricle has no regional wall motion abnormalities. The left ventricular internal cavity size was normal in size. There is  mild concentric left ventricular  hypertrophy. Left ventricular diastolic parameters are consistent with Grade I diastolic dysfunction (impaired relaxation). Right Ventricle: The right ventricular size is normal. No increase in right ventricular wall thickness. Right  ventricular systolic function is normal. There is normal pulmonary artery systolic pressure. The tricuspid regurgitant velocity is 2.55 m/s, and  with an assumed right atrial pressure of 3 mmHg, the estimated right ventricular systolic pressure is 29.0 mmHg. Left Atrium: Left atrial size was mildly dilated. Right Atrium: Right atrial size was normal in size. Pericardium: Trivial pericardial effusion is present. The pericardial effusion is posterior to the left ventricle. Presence of epicardial fat layer. Mitral Valve: The mitral valve is grossly normal. Trivial mitral valve regurgitation. MV peak gradient, 3.5 mmHg. The mean mitral valve gradient is 1.0 mmHg. Tricuspid Valve: The tricuspid valve is grossly normal. Tricuspid valve regurgitation is trivial. Aortic Valve: The aortic valve is tricuspid. There is mild aortic valve annular calcification. Aortic valve regurgitation is not visualized. Aortic valve mean gradient measures 3.0 mmHg. Aortic valve peak gradient measures 5.8 mmHg. Aortic valve area, by  VTI measures 2.49 cm. Pulmonic Valve: The pulmonic valve was grossly normal. Pulmonic valve regurgitation is trivial. Aorta: The aortic root and ascending aorta are structurally normal, with no evidence of dilitation. Venous: The inferior vena cava is normal in size with greater than 50% respiratory variability, suggesting right atrial pressure of 3 mmHg. IAS/Shunts: No atrial level shunt detected by color flow Doppler. Additional Comments: 3D was performed not requiring image post processing on an independent workstation and was indeterminate.  LEFT VENTRICLE PLAX 2D LVIDd:         4.60 cm     Diastology LVIDs:         3.10 cm     LV e' medial:    4.90 cm/s LV PW:         1.10 cm     LV E/e' medial:  16.7 LV IVS:        1.20 cm     LV e' lateral:   7.51 cm/s LVOT diam:     2.10 cm     LV E/e' lateral: 10.9 LV SV:         65 LV SV Index:   35 LVOT Area:     3.46 cm  LV Volumes (MOD) LV vol d, MOD A2C: 75.2 ml LV  vol d, MOD A4C: 93.5 ml LV vol s, MOD A2C: 31.8 ml LV vol s, MOD A4C: 39.9 ml LV SV MOD A2C:     43.4 ml LV SV MOD A4C:     93.5 ml LV SV MOD BP:      48.1 ml RIGHT VENTRICLE             IVC RV Basal diam:  3.50 cm     IVC diam: 2.10 cm RV Mid diam:    2.60 cm RV S prime:     11.60 cm/s TAPSE (M-mode): 1.8 cm LEFT ATRIUM           Index        RIGHT ATRIUM           Index LA Vol (A4C): 68.4 ml 36.63 ml/m  RA Area:     16.60 cm                                    RA Volume:   42.40 ml  22.71 ml/m  AORTIC VALVE                    PULMONIC VALVE AV Area (Vmax):    2.53 cm     PV Vmax:       0.81 m/s AV Area (Vmean):   2.65 cm     PV Peak grad:  2.7 mmHg AV Area (VTI):     2.49 cm AV Vmax:           120.00 cm/s AV Vmean:          77.900 cm/s AV VTI:            0.260 m AV Peak Grad:      5.8 mmHg AV Mean Grad:      3.0 mmHg LVOT Vmax:         87.50 cm/s LVOT Vmean:        59.500 cm/s LVOT VTI:          0.187 m LVOT/AV VTI ratio: 0.72  AORTA Ao Root diam: 2.90 cm Ao Asc diam:  3.50 cm MITRAL VALVE               TRICUSPID VALVE MV Area (PHT): 3.46 cm    TR Peak grad:   26.0 mmHg MV Area VTI:   2.22 cm    TR Vmax:        255.00 cm/s MV Peak grad:  3.5 mmHg MV Mean grad:  1.0 mmHg    SHUNTS MV Vmax:       0.94 m/s    Systemic VTI:  0.19 m MV Vmean:      55.4 cm/s   Systemic Diam: 2.10 cm MV Decel Time: 219 msec MV E velocity: 81.80 cm/s MV A velocity: 62.60 cm/s MV E/A ratio:  1.31 Nona Dell MD Electronically signed by Nona Dell MD Signature Date/Time: 12/08/2023/1:57:30 PM    Final     Recent Labs: Lab Results  Component Value Date   WBC 4.1 11/17/2023   HGB 13.1 11/17/2023   PLT 226.0 11/17/2023   NA 142 12/08/2023   K 4.3 12/08/2023   CL 106 12/08/2023   CO2 25 12/08/2023   GLUCOSE 111 (H) 12/08/2023   BUN 11 12/08/2023   CREATININE 0.76 12/08/2023   BILITOT 0.5 12/08/2023   ALKPHOS 94 12/08/2023   AST 19 12/08/2023   ALT 10 12/08/2023   PROT 6.6 12/08/2023   ALBUMIN 4.2 12/08/2023    CALCIUM 9.6 12/08/2023   GFRAA 108 03/18/2021    Speciality Comments: No specialty comments available.  Procedures:  No procedures performed Allergies: Codeine and Hydrocodone    Assessment / Plan:     Visit Diagnoses: Dermatomyositis (HCC) - Positive biopsy May 2016, elevated CK and aldolase, Gottron's papules, muscle weakness, fatigue, positive Ro 52, positive anti-chromatin: She has not had any signs or symptoms of a myositis flare.  No muscular weakness was noted on examination today.  No difficulty raising her arms above her head or rising from a seated position.  CK was within normal limits: 88 on 12/08/2023. She is clinically doing well taking Imuran 100 mg daily.  She is tolerating Imuran without any side effects and has not had any recent gaps in therapy. Patient remains under close follow-up with dermatology.  No recurrence of Gottron's papules.  No other new rashes noted.  She has had several recurrences of squamous cell and basal cell carcinomas and will be undergoing further biopsy next week. Discussed the importance of remaining  up-to-date with age-related cancer screenings.  Discussed increased risk for malignancy in patients with dermatomyositis. She has establish care with Dr. Marchelle Gearing for screening of ILD.  Patient has had a high-resolution chest CT and results are pending.  She also had an echocardiogram performed yesterday on 12/08/2023.  She will be undergoing pulmonary function testing on 12/14/2023. She will remain on Imuran as prescribed.  She was vies notify us if she develops signs or symptoms of a flare. - Plan: CK, azaTHIOprine (IMURAN) 50 MG tablet  Sjogren's syndrome with keratoconjunctivitis sicca (HCC) - +ANA, +Ro: Patient continues to have chronic sicca symptoms.  She has been using Biotene oral rinse and drinking water throughout the day for symptoms of dry mouth.  She has been using Systane eyedrops as recommended by [facility] for dry eyes.  Recommended  follow-up with her dentist every 4 months and ophthalmology at least on a yearly basis. Discussed the 4-14 fold increased risk for developing lymphoma in patients with Sjogren's syndrome.  SPEP within normal limits on 06/10/2023.   She has no synovitis on examination today.  Overall she is clinically doing well taking Imuran 100 mg daily. Patient is currently undergoing a workup for monitoring of ILD with Dr. Marchelle Gearing.  She had a high-resolution chest CT on 12/04/2023-results pending.  PFTs are scheduled for 12/14/2023.  Patient had echocardiogram performed yesterday on 12/08/2023. Future orders for the following lab work was placed today to be drawn with her next labs due in June.  She was advised to notify us if she develops any new or worsening symptoms.  - Plan: CBC with Differential/Platelet, COMPLETE METABOLIC PANEL WITH GFR, CK, Sjogrens syndrome-A extractable nuclear antibody, Rheumatoid factor, C3 and C4, Sedimentation rate, Serum protein electrophoresis with reflex  High risk medication use - Imuran 100 mg by mouth daily.  CMP-glucose 111, rest of WNL 12/08/23.  CBC updated on 11/17/23  No recent or recurrent infections.- Plan: CBC with Differential/Platelet, COMPLETE METABOLIC PANEL WITH GFR  Primary osteoarthritis of both hands: She has PIP and DIP thickening consistent with osteoarthritis of both hands.  No synovitis noted on examination today.  Discussed the importance of joint protection and muscle strengthening.  Primary osteoarthritis of both knees - X-rays of both knees were consistent with moderate osteoarthritis and moderate chondromalacia patella on 11/19/2016.  X-rays of both knees were updated today.  She continues to have persistent discomfort in both knees and has had increased difficulty climbing steps.  Discussed the option of applying for viscosupplementation but she would like to hold off at this time.- Plan: XR KNEE 3 VIEW RIGHT, XR KNEE 3 VIEW LEFT  Chronic pain of both knees  -Patient presents today with ongoing pain in both knee joints.  She has been having increased difficulty rising from a seated position as well as climbing steps.  X-rays of both knees were updated today.  Reviewed x-rays from 2018 with the patient-previous findings were consistent with moderate osteoarthritis of moderate chondromalacia patella.  Discussed that she will benefit from viscosupplementation which she plans on considering.  In the meantime she is going to use trying a gel and heating pad as needed for symptomatic relief.  Discussed the importance of lower extremity muscle strengthening.  Patient declined a referral to physical therapy at this time.  Plan: XR KNEE 3 VIEW RIGHT, XR KNEE 3 VIEW LEFT  Primary osteoarthritis of both feet: Good range of motion of both ankle joints.  Pedal edema noted in bilateral lower extremities.  Spondylosis  of lumbar region without myelopathy or radiculopathy: No symptoms of radiculopathy at this time.  Other medical conditions are listed as follows:  Vitamin D deficiency: She is taking vitamin D 2000 units daily.  History of pernicious anemia: She is receiving vitamin B12 at 1000 mcg injections every 30 days.  History of squamous cell carcinoma: She has been following up closely with dermatology due to several squamous cell and basal cell carcinomas on her upper and lower extremities.  Basal cell carcinoma (BCC) of overlapping sites of skin: Followed closely by dermatology.  History of hypothyroidism  History of anxiety  Other insomnia  Other fatigue   Orders: Orders Placed This Encounter  Procedures   XR KNEE 3 VIEW RIGHT   XR KNEE 3 VIEW LEFT   CBC with Differential/Platelet   COMPLETE METABOLIC PANEL WITH GFR   CK   Sjogrens syndrome-A extractable nuclear antibody   Rheumatoid factor   C3 and C4   Sedimentation rate   Serum protein electrophoresis with reflex   Meds ordered this encounter  Medications   azaTHIOprine (IMURAN) 50  MG tablet    Sig: Take 2 tablets (100 mg total) by mouth daily.    Dispense:  180 tablet    Refill:  0     Follow-Up Instructions: Return in about 3 months (around 03/10/2024).   Gearldine Bienenstock, PA-C  Note - This record has been created using Dragon software.  Chart creation errors have been sought, but may not always  have been located. Such creation errors do not reflect on  the standard of medical care.

## 2023-12-02 ENCOUNTER — Other Ambulatory Visit: Payer: Self-pay | Admitting: *Deleted

## 2023-12-02 DIAGNOSIS — M3313 Other dermatomyositis without myopathy: Secondary | ICD-10-CM

## 2023-12-02 DIAGNOSIS — Z79899 Other long term (current) drug therapy: Secondary | ICD-10-CM

## 2023-12-04 ENCOUNTER — Ambulatory Visit (HOSPITAL_COMMUNITY)
Admission: RE | Admit: 2023-12-04 | Discharge: 2023-12-04 | Disposition: A | Discharge status: Home / Self Care | Payer: Medicare Other | Source: Ambulatory Visit | Attending: Internal Medicine | Admitting: Internal Medicine

## 2023-12-04 DIAGNOSIS — M3501 Sicca syndrome with keratoconjunctivitis: Secondary | ICD-10-CM | POA: Insufficient documentation

## 2023-12-04 DIAGNOSIS — R0609 Other forms of dyspnea: Secondary | ICD-10-CM | POA: Diagnosis not present

## 2023-12-04 DIAGNOSIS — R0789 Other chest pain: Secondary | ICD-10-CM | POA: Insufficient documentation

## 2023-12-04 DIAGNOSIS — K224 Dyskinesia of esophagus: Secondary | ICD-10-CM | POA: Diagnosis not present

## 2023-12-04 DIAGNOSIS — J302 Other seasonal allergic rhinitis: Secondary | ICD-10-CM | POA: Insufficient documentation

## 2023-12-04 DIAGNOSIS — M3313 Other dermatomyositis without myopathy: Secondary | ICD-10-CM | POA: Insufficient documentation

## 2023-12-04 DIAGNOSIS — R918 Other nonspecific abnormal finding of lung field: Secondary | ICD-10-CM | POA: Diagnosis not present

## 2023-12-04 DIAGNOSIS — Z79899 Other long term (current) drug therapy: Secondary | ICD-10-CM | POA: Diagnosis not present

## 2023-12-04 DIAGNOSIS — R238 Other skin changes: Secondary | ICD-10-CM | POA: Insufficient documentation

## 2023-12-08 ENCOUNTER — Other Ambulatory Visit: Payer: Self-pay | Admitting: *Deleted

## 2023-12-08 ENCOUNTER — Ambulatory Visit (HOSPITAL_COMMUNITY)
Admission: RE | Admit: 2023-12-08 | Discharge: 2023-12-08 | Disposition: A | Discharge status: Home / Self Care | Source: Ambulatory Visit | Attending: Internal Medicine | Admitting: Internal Medicine

## 2023-12-08 DIAGNOSIS — R238 Other skin changes: Secondary | ICD-10-CM | POA: Diagnosis not present

## 2023-12-08 DIAGNOSIS — Z79899 Other long term (current) drug therapy: Secondary | ICD-10-CM

## 2023-12-08 DIAGNOSIS — R0609 Other forms of dyspnea: Secondary | ICD-10-CM

## 2023-12-08 DIAGNOSIS — M3313 Other dermatomyositis without myopathy: Secondary | ICD-10-CM

## 2023-12-08 DIAGNOSIS — M3501 Sicca syndrome with keratoconjunctivitis: Secondary | ICD-10-CM | POA: Diagnosis not present

## 2023-12-08 DIAGNOSIS — R0789 Other chest pain: Secondary | ICD-10-CM | POA: Diagnosis not present

## 2023-12-08 DIAGNOSIS — J302 Other seasonal allergic rhinitis: Secondary | ICD-10-CM

## 2023-12-08 LAB — ECHOCARDIOGRAM COMPLETE
AR max vel: 2.53 cm2
AV Area VTI: 2.49 cm2
AV Area mean vel: 2.65 cm2
AV Mean grad: 3 mmHg
AV Peak grad: 5.8 mmHg
Ao pk vel: 1.2 m/s
Area-P 1/2: 3.46 cm2
Calc EF: 57.1 %
MV VTI: 2.22 cm2
S' Lateral: 3.1 cm
Single Plane A2C EF: 57.7 %
Single Plane A4C EF: 57.3 %

## 2023-12-09 ENCOUNTER — Ambulatory Visit (INDEPENDENT_AMBULATORY_CARE_PROVIDER_SITE_OTHER)

## 2023-12-09 ENCOUNTER — Ambulatory Visit: Payer: Self-pay | Attending: Physician Assistant | Admitting: Physician Assistant

## 2023-12-09 ENCOUNTER — Ambulatory Visit

## 2023-12-09 ENCOUNTER — Encounter: Payer: Self-pay | Admitting: Physician Assistant

## 2023-12-09 VITALS — BP 138/74 | HR 55 | Resp 14 | Ht 67.0 in | Wt 180.0 lb

## 2023-12-09 DIAGNOSIS — Z79899 Other long term (current) drug therapy: Secondary | ICD-10-CM

## 2023-12-09 DIAGNOSIS — M19071 Primary osteoarthritis, right ankle and foot: Secondary | ICD-10-CM | POA: Diagnosis not present

## 2023-12-09 DIAGNOSIS — G8929 Other chronic pain: Secondary | ICD-10-CM

## 2023-12-09 DIAGNOSIS — M170 Bilateral primary osteoarthritis of knee: Secondary | ICD-10-CM | POA: Diagnosis not present

## 2023-12-09 DIAGNOSIS — M19072 Primary osteoarthritis, left ankle and foot: Secondary | ICD-10-CM

## 2023-12-09 DIAGNOSIS — Z8639 Personal history of other endocrine, nutritional and metabolic disease: Secondary | ICD-10-CM | POA: Diagnosis not present

## 2023-12-09 DIAGNOSIS — M3501 Sicca syndrome with keratoconjunctivitis: Secondary | ICD-10-CM | POA: Diagnosis not present

## 2023-12-09 DIAGNOSIS — Z862 Personal history of diseases of the blood and blood-forming organs and certain disorders involving the immune mechanism: Secondary | ICD-10-CM

## 2023-12-09 DIAGNOSIS — M25562 Pain in left knee: Secondary | ICD-10-CM

## 2023-12-09 DIAGNOSIS — M25561 Pain in right knee: Secondary | ICD-10-CM

## 2023-12-09 DIAGNOSIS — M47816 Spondylosis without myelopathy or radiculopathy, lumbar region: Secondary | ICD-10-CM

## 2023-12-09 DIAGNOSIS — Z8589 Personal history of malignant neoplasm of other organs and systems: Secondary | ICD-10-CM | POA: Diagnosis not present

## 2023-12-09 DIAGNOSIS — E559 Vitamin D deficiency, unspecified: Secondary | ICD-10-CM

## 2023-12-09 DIAGNOSIS — M19041 Primary osteoarthritis, right hand: Secondary | ICD-10-CM | POA: Diagnosis not present

## 2023-12-09 DIAGNOSIS — M3313 Other dermatomyositis without myopathy: Secondary | ICD-10-CM | POA: Diagnosis not present

## 2023-12-09 DIAGNOSIS — G4709 Other insomnia: Secondary | ICD-10-CM

## 2023-12-09 DIAGNOSIS — M19042 Primary osteoarthritis, left hand: Secondary | ICD-10-CM

## 2023-12-09 DIAGNOSIS — C4481 Basal cell carcinoma of overlapping sites of skin: Secondary | ICD-10-CM

## 2023-12-09 DIAGNOSIS — R5383 Other fatigue: Secondary | ICD-10-CM

## 2023-12-09 DIAGNOSIS — Z8659 Personal history of other mental and behavioral disorders: Secondary | ICD-10-CM

## 2023-12-09 LAB — CMP14+EGFR
ALT: 10 IU/L (ref 0–32)
AST: 19 IU/L (ref 0–40)
Albumin: 4.2 g/dL (ref 3.9–4.9)
Alkaline Phosphatase: 94 IU/L (ref 44–121)
BUN/Creatinine Ratio: 14 (ref 12–28)
BUN: 11 mg/dL (ref 8–27)
Bilirubin Total: 0.5 mg/dL (ref 0.0–1.2)
CO2: 25 mmol/L (ref 20–29)
Calcium: 9.6 mg/dL (ref 8.7–10.3)
Chloride: 106 mmol/L (ref 96–106)
Creatinine, Ser: 0.76 mg/dL (ref 0.57–1.00)
Globulin, Total: 2.4 g/dL (ref 1.5–4.5)
Glucose: 111 mg/dL — ABNORMAL HIGH (ref 70–99)
Potassium: 4.3 mmol/L (ref 3.5–5.2)
Sodium: 142 mmol/L (ref 134–144)
Total Protein: 6.6 g/dL (ref 6.0–8.5)
eGFR: 86 mL/min/{1.73_m2} (ref >59)

## 2023-12-09 LAB — CK: Total CK: 88 U/L (ref 32–182)

## 2023-12-09 MED ORDER — AZATHIOPRINE 50 MG PO TABS
100.0000 mg | ORAL_TABLET | Freq: Every day | ORAL | 0 refills | Status: DC
Start: 2023-12-09 — End: 2024-02-09

## 2023-12-09 NOTE — Patient Instructions (Signed)
 Knee Exercises Ask your health care provider which exercises are safe for you. Do exercises exactly as told by your health care provider and adjust them as directed. It is normal to feel mild stretching, pulling, tightness, or discomfort as you do these exercises. Stop right away if you feel sudden pain or your pain gets worse. Do not begin these exercises until told by your health care provider. Stretching and range-of-motion exercises These exercises warm up your muscles and joints and improve the movement and flexibility of your knee. These exercises also help to relieve pain and swelling. Knee extension, prone  Lie on your abdomen (prone position) on a bed. Place your left / right knee just beyond the edge of the surface so your knee is not on the bed. You can put a towel under your left / right thigh just above your kneecap for comfort. Relax your leg muscles and allow gravity to straighten your knee (extension). You should feel a stretch behind your left / right knee. Hold this position for __________ seconds. Scoot up so your knee is supported between repetitions. Repeat __________ times. Complete this exercise __________ times a day. Knee flexion, active  Lie on your back with both legs straight. If this causes back discomfort, bend your left / right knee so your foot is flat on the floor. Slowly slide your left / right heel back toward your buttocks. Stop when you feel a gentle stretch in the front of your knee or thigh (flexion). Hold this position for __________ seconds. Slowly slide your left / right heel back to the starting position. Repeat __________ times. Complete this exercise __________ times a day. Quadriceps stretch, prone  Lie on your abdomen on a firm surface, such as a bed or padded floor. Bend your left / right knee and hold your ankle. If you cannot reach your ankle or pant leg, loop a belt around your foot and grab the belt instead. Gently pull your heel toward your  buttocks. Your knee should not slide out to the side. You should feel a stretch in the front of your thigh and knee (quadriceps). Hold this position for __________ seconds. Repeat __________ times. Complete this exercise __________ times a day. Hamstring, supine  Lie on your back (supine position). Loop a belt or towel over the ball of your left / right foot. The ball of your foot is on the walking surface, right under your toes. Straighten your left / right knee and slowly pull on the belt to raise your leg until you feel a gentle stretch behind your knee (hamstring). Do not let your knee bend while you do this. Keep your other leg flat on the floor. Hold this position for __________ seconds. Repeat __________ times. Complete this exercise __________ times a day. Strengthening exercises These exercises build strength and endurance in your knee. Endurance is the ability to use your muscles for a long time, even after they get tired. Quadriceps, isometric This exercise strengthens the muscles in front of your thigh (quadriceps) without moving your knee joint (isometric). Lie on your back with your left / right leg extended and your other knee bent. Put a rolled towel or small pillow under your knee if told by your health care provider. Slowly tense the muscles in the front of your left / right thigh. You should see your kneecap slide up toward your hip or see increased dimpling just above the knee. This motion will push the back of the knee toward the floor.  For __________ seconds, hold the muscle as tight as you can without increasing your pain. Relax the muscles slowly and completely. Repeat __________ times. Complete this exercise __________ times a day. Straight leg raises This exercise strengthens the muscles in front of your thigh (quadriceps) and the muscles that move your hips (hip flexors). Lie on your back with your left / right leg extended and your other knee bent. Tense the  muscles in the front of your left / right thigh. You should see your kneecap slide up or see increased dimpling just above the knee. Your thigh may even shake a bit. Keep these muscles tight as you raise your leg 4-6 inches (10-15 cm) off the floor. Do not let your knee bend. Hold this position for __________ seconds. Keep these muscles tense as you lower your leg. Relax your muscles slowly and completely after each repetition. Repeat __________ times. Complete this exercise __________ times a day. Hamstring, isometric  Lie on your back on a firm surface. Bend your left / right knee about __________ degrees. Dig your left / right heel into the surface as if you are trying to pull it toward your buttocks. Tighten the muscles in the back of your thighs (hamstring) to "dig" as hard as you can without increasing any pain. Hold this position for __________ seconds. Release the tension gradually and allow your muscles to relax completely for __________ seconds after each repetition. Repeat __________ times. Complete this exercise __________ times a day. Hamstring curls If told by your health care provider, do this exercise while wearing ankle weights. Begin with __________lb / kg weights. Then increase the weight by 1 lb (0.5 kg) increments. Do not wear ankle weights that are more than __________lb / kg. Lie on your abdomen with your legs straight. Bend your left / right knee as far as you can without feeling pain. Keep your hips flat against the floor. Hold this position for __________ seconds. Slowly lower your leg to the starting position. Repeat __________ times. Complete this exercise __________ times a day. Squats This exercise strengthens the muscles in front of your thigh and knee (quadriceps). Stand in front of a table, with your feet and knees pointing straight ahead. You may rest your hands on the table for balance but not for support. Slowly bend your knees and lower your hips like you  are going to sit in a chair. Keep your weight over your heels, not over your toes. Keep your lower legs upright so they are parallel with the table legs. Do not let your hips go lower than your knees. Do not bend lower than told by your health care provider. If your knee pain increases, do not bend as low. Hold the squat position for __________ seconds. Slowly push with your legs to return to standing. Do not use your hands to pull yourself to standing. Repeat __________ times. Complete this exercise __________ times a day. Wall slides This exercise strengthens the muscles in front of your thigh and knee (quadriceps). Lean your back against a smooth wall or door, and walk your feet out 18-24 inches (46-61 cm) from it. Place your feet hip-width apart. Slowly slide down the wall or door until your knees bend __________ degrees. Keep your knees over your heels, not over your toes. Keep your knees in line with your hips. Hold this position for __________ seconds. Repeat __________ times. Complete this exercise __________ times a day. Straight leg raises, side-lying This exercise strengthens the muscles that rotate  the leg at the hip and move it away from your body (hip abductors). Lie on your side with your left / right leg in the top position. Lie so your head, shoulder, knee, and hip line up. You may bend your bottom knee to help you keep your balance. Roll your hips slightly forward so your hips are stacked directly over each other and your left / right knee is facing forward. Leading with your heel, lift your top leg 4-6 inches (10-15 cm). You should feel the muscles in your outer hip lifting. Do not let your foot drift forward. Do not let your knee roll toward the ceiling. Hold this position for __________ seconds. Slowly return your leg to the starting position. Let your muscles relax completely after each repetition. Repeat __________ times. Complete this exercise __________ times a  day. Straight leg raises, prone This exercise stretches the muscles that move your hips away from the front of the pelvis (hip extensors). Lie on your abdomen on a firm surface. You can put a pillow under your hips if that is more comfortable. Tense the muscles in your buttocks and lift your left / right leg about 4-6 inches (10-15 cm). Keep your knee straight as you lift your leg. Hold this position for __________ seconds. Slowly lower your leg to the starting position. Let your leg relax completely after each repetition. Repeat __________ times. Complete this exercise __________ times a day. This information is not intended to replace advice given to you by your health care provider. Make sure you discuss any questions you have with your health care provider. Document Revised: 05/20/2021 Document Reviewed: 05/20/2021 [employer] Patient Education  2024 [employer].

## 2023-12-09 NOTE — Progress Notes (Signed)
 CK WNL

## 2023-12-09 NOTE — Progress Notes (Signed)
 CMP normal except glucose is mildly elevated, probably not a fasting sample.

## 2023-12-09 NOTE — Progress Notes (Signed)
 X-rays of both knees consistent with moderate osteoarthritis and moderate chondromalacia patella.  Chondrocalcinosis noted in both knees.  Radiographic progression was noted.

## 2023-12-10 DIAGNOSIS — E538 Deficiency of other specified B group vitamins: Secondary | ICD-10-CM | POA: Diagnosis not present

## 2023-12-14 ENCOUNTER — Ambulatory Visit (HOSPITAL_COMMUNITY)
Admission: RE | Admit: 2023-12-14 | Discharge: 2023-12-14 | Disposition: A | Discharge status: Home / Self Care | Source: Ambulatory Visit | Attending: Internal Medicine | Admitting: Internal Medicine

## 2023-12-14 DIAGNOSIS — R0609 Other forms of dyspnea: Secondary | ICD-10-CM | POA: Insufficient documentation

## 2023-12-14 DIAGNOSIS — Z79899 Other long term (current) drug therapy: Secondary | ICD-10-CM | POA: Insufficient documentation

## 2023-12-14 DIAGNOSIS — R238 Other skin changes: Secondary | ICD-10-CM | POA: Diagnosis not present

## 2023-12-14 DIAGNOSIS — M3501 Sicca syndrome with keratoconjunctivitis: Secondary | ICD-10-CM | POA: Insufficient documentation

## 2023-12-14 DIAGNOSIS — M3313 Other dermatomyositis without myopathy: Secondary | ICD-10-CM | POA: Insufficient documentation

## 2023-12-14 DIAGNOSIS — R0789 Other chest pain: Secondary | ICD-10-CM | POA: Diagnosis not present

## 2023-12-14 DIAGNOSIS — J302 Other seasonal allergic rhinitis: Secondary | ICD-10-CM | POA: Insufficient documentation

## 2023-12-14 LAB — PULMONARY FUNCTION TEST
DL/VA % pred: 93 %
DL/VA: 3.89 ml/min/mmHg/L
DLCO cor % pred: 96 %
DLCO cor: 18.97 ml/min/mmHg
DLCO unc % pred: 95 %
DLCO unc: 18.79 ml/min/mmHg
FEF 25-75 Post: 3.32 L/s
FEF 25-75 Pre: 2.62 L/s
FEF2575-%Change-Post: 26 %
FEF2575-%Pred-Post: 162 %
FEF2575-%Pred-Pre: 128 %
FEV1-%Change-Post: 5 %
FEV1-%Pred-Post: 106 %
FEV1-%Pred-Pre: 100 %
FEV1-Post: 2.5 L
FEV1-Pre: 2.38 L
FEV1FVC-%Change-Post: 7 %
FEV1FVC-%Pred-Pre: 105 %
FEV6-%Change-Post: -2 %
FEV6-%Pred-Post: 97 %
FEV6-%Pred-Pre: 99 %
FEV6-Post: 2.89 L
FEV6-Pre: 2.95 L
FEV6FVC-%Pred-Post: 104 %
FEV6FVC-%Pred-Pre: 104 %
FVC-%Change-Post: -2 %
FVC-%Pred-Post: 93 %
FVC-%Pred-Pre: 95 %
FVC-Post: 2.89 L
FVC-Pre: 2.95 L
Post FEV1/FVC ratio: 87 %
Post FEV6/FVC ratio: 100 %
Pre FEV1/FVC ratio: 81 %
Pre FEV6/FVC Ratio: 100 %
RV % pred: 87 %
RV: 1.86 L
TLC % pred: 99 %
TLC: 5.04 L

## 2023-12-14 MED ORDER — ALBUTEROL SULFATE (2.5 MG/3ML) 0.083% IN NEBU
2.5000 mg | INHALATION_SOLUTION | Freq: Once | RESPIRATORY_TRACT | Status: AC
Start: 2023-12-14 — End: 2023-12-14
  Administered 2023-12-14: 2.5 mg via RESPIRATORY_TRACT

## 2023-12-15 ENCOUNTER — Ambulatory Visit: Payer: Medicare HMO | Admitting: Physician Assistant

## 2023-12-15 DIAGNOSIS — C44619 Basal cell carcinoma of skin of left upper limb, including shoulder: Secondary | ICD-10-CM | POA: Diagnosis not present

## 2023-12-15 DIAGNOSIS — C44622 Squamous cell carcinoma of skin of right upper limb, including shoulder: Secondary | ICD-10-CM | POA: Diagnosis not present

## 2023-12-15 DIAGNOSIS — D0461 Carcinoma in situ of skin of right upper limb, including shoulder: Secondary | ICD-10-CM | POA: Diagnosis not present

## 2023-12-15 DIAGNOSIS — C44629 Squamous cell carcinoma of skin of left upper limb, including shoulder: Secondary | ICD-10-CM | POA: Diagnosis not present

## 2023-12-15 DIAGNOSIS — L603 Nail dystrophy: Secondary | ICD-10-CM | POA: Diagnosis not present

## 2023-12-22 ENCOUNTER — Telehealth: Payer: Medicare Other | Admitting: Internal Medicine

## 2023-12-22 ENCOUNTER — Encounter: Payer: Self-pay | Admitting: Internal Medicine

## 2023-12-22 DIAGNOSIS — J302 Other seasonal allergic rhinitis: Secondary | ICD-10-CM

## 2023-12-22 DIAGNOSIS — Z7722 Contact with and (suspected) exposure to environmental tobacco smoke (acute) (chronic): Secondary | ICD-10-CM | POA: Diagnosis not present

## 2023-12-22 DIAGNOSIS — R0609 Other forms of dyspnea: Secondary | ICD-10-CM | POA: Diagnosis not present

## 2023-12-22 DIAGNOSIS — R0989 Other specified symptoms and signs involving the circulatory and respiratory systems: Secondary | ICD-10-CM

## 2023-12-22 DIAGNOSIS — I5189 Other ill-defined heart diseases: Secondary | ICD-10-CM | POA: Diagnosis not present

## 2023-12-22 MED ORDER — FLUTICASONE FUROATE-VILANTEROL 100-25 MCG/ACT IN AEPB: 1.0000 | INHALATION_SPRAY | Freq: Every day | RESPIRATORY_TRACT | Status: AC

## 2023-12-22 MED ORDER — ALBUTEROL SULFATE HFA 108 (90 BASE) MCG/ACT IN AERS: 2.0000 | INHALATION_SPRAY | Freq: Four times a day (QID) | RESPIRATORY_TRACT | 2 refills | Status: DC | PRN

## 2023-12-22 NOTE — Progress Notes (Signed)
 OV 11/17/2023  Subjective:  Patient ID: Carmen Woods, female , DOB: [birth year] , age 68 y.o. , MRN: [id number] , ADDRESS: [address] [place] [address] PCP Ignatius Specking, MD Patient Care Team: Ignatius Specking, MD as PCP - General (Internal Medicine)  This Provider for this visit: Treatment Team:  Attending Provider: Kalman Shan, MD    11/17/2023 -   Chief Complaint  Patient presents with   Pulmonary Consult    Referred by Sherron Ales, PA for eval of ILD. Pt has DOE with walking "not even half a mile" and in the am when she wakes up "feels like a weight on my chest". Symptoms started about 2 years ago.      HPI [place] 68 y.o. -referred by Sherron Ales physician assistant at rheumatology.  I reviewed those external records.  History is provided through that and talking to the patient.  [place].  Diagnosed with both dermatomyositis with Gottron papules and also Sjogren's based on antibody profile.  She is on Imuran for several years.  When first diagnosed in 2016/2017 she was on prednisone and since then prednisone intermittently.  No prednisone in the last 1 year.  She has ongoing arthralgia from the hands ankles knees and the right hip.  She is ongoing rash.  But she is here because she has insidious onset of shortness of breath relieved by exertion for the last few years.  Severity score is below.  It is definitely not worse since it started it is stable.  There is also associated chest heaviness when she wakes up.  There is no wheezing but she does have a mild cough and she is on daily seasonal allergy antihistamine.  She does have seasonal allergies.  She was told by Sherron Ales to establish a baseline with's because of the high propensity for pulmonary issues with autoimmune/connective tissue disease.  Despite the high level symptom burden she did normal with a simple exercise hypoxemia test.     CXR 2016:  CLEAR  OV 12/22/2023  Subjective:  Patient ID: Carmen Woods, female , DOB: 25-Apr-1956 , age 68 y.o. , MRN: [id number] , ADDRESS: [address] [place] [address] PCP Ignatius Specking, MD Patient Care Team: Ignatius Specking, MD as PCP - General (Internal Medicine)  This Provider for this visit: Treatment Team:  Attending Provider: Kalman Shan, MD  Type of visit: Video Virtual Visit Identification of patient Carmen Woods with 12-Sep-1956 and MRN [id number] - 2 person identifier Risks: Risks, benefits, limitations of telephone visit explained. Patient understood and verbalized agreement to proceed Anyone else on call: just her Patient location: her hime This provider location: [address], [address]; [place]; [place] [address].  Pulmonary Office. [phone]   12/22/2023 -  FU dyspnea on exerton in setting of SJogrens   HPI [name] 68 y.o. -this video visit is to discuss test results.  Still hs DOE to [employer] and back. Also, chest heaviness when she wakes up in Morning. When she wakes up she feels there is a weight on the chest and an hour later as she moves it goes away. Last stress test in 2014 nd normal. Cough is only mild and in evenings. Now more due to pollnen.    Her RAST allergy panel is normal.  Her blood count is normal.  Her pulmonary function test is normal.  CT  scan did not show any evidence of ILD there is just some mild air trapping.  Her echocardiogram is normal including pulmonary artery pressures.  This is grade 1 diastolic dysfunction.   C/o arthritis in back and knees  SYMPTOM SCALE - ILD 11/17/2023  Current weight   O2 use ra  Shortness of Breath 0 -> 5 scale with 5 being worst (score 6 If unable to do)  At rest 0  Simple tasks - showers, clothes change, eating, shaving 2  Household (dishes, doing bed, laundry) 3  Shopping 3  Walking level at own pace 2  Walking up Stairs 3  Total (30-36) Dyspnea Score 13  How bad  is your cough? 1  How bad is your fatigue 4  How bad is nausea 2  How bad is vomiting?  0  How bad is diarrhea? 2  How bad is anxiety? 4  How bad is depression 4  Any chronic pain - if so where and how bad 4     Latest Reference Range & Units 06/01/23 11:38  EOS (ABSOLUTE) 0.0 - 0.4 x10E3/uL 0.3    Simple office walk 224 (66+46 x 2) feet Pod A at [facility] x  3 laps goal with forehead probe 11/17/2023    O2 used ra   Number laps completed Sit stand x 15   Comments about pace normal   Resting Pulse Ox/HR 99% and 59/min   Final Pulse Ox/HR 99% and 85/min   Desaturated </= 88% n   Desaturated <= 3% points no   Got Tachycardic >/= 90/min no   Symptoms at end of test no   Miscellaneous comments normal       arrative & Impression  CLINICAL DATA:  Shortness of breath on exertion, chest heaviness, dermatomyositis, Sjogren syndrome.   EXAM: CT CHEST WITHOUT CONTRAST   TECHNIQUE: Multidetector CT imaging of the chest was performed following the standard protocol without intravenous contrast. High resolution imaging of the lungs, as well as inspiratory and expiratory imaging, was performed.   RADIATION DOSE REDUCTION: This exam was performed according to the departmental dose-optimization program which includes automated exposure control, adjustment of the mA and/or kV according to patient size and/or use of iterative reconstruction technique.   COMPARISON:  None available.   FINDINGS: Cardiovascular: Atherosclerotic calcification of the aorta. Heart is enlarged. No pericardial effusion.   Mediastinum/Nodes: No pathologically enlarged mediastinal or axillary lymph nodes. Hilar regions are difficult to definitively evaluate without IV contrast. Air in the esophagus can be seen with dysmotility.   Lungs/Pleura: Negative for subpleural reticulation, traction bronchiectasis/bronchiolectasis, ground glass, architectural distortion or honeycombing. Minimal scattered  pulmonary parenchymal scarring. 2 mm left upper lobe nodule (5/41). No pleural fluid. Airway is unremarkable. Minimal basilar air trapping.   Upper Abdomen: Visualized portions of the liver, adrenal glands, kidneys, spleen, pancreas, stomach and bowel are grossly unremarkable. No upper abdominal adenopathy.   Musculoskeletal: Degenerative changes in the spine.   IMPRESSION: 1. No evidence of interstitial lung disease. 2. Minimal basilar air trapping is indicative of small airways disease. 3. 2 mm left upper lobe nodule. No follow-up needed if patient is low-risk.This recommendation follows the consensus statement: Guidelines for Management of Incidental Pulmonary Nodules Detected on CT Images: From the Fleischner Society 2017; Radiology 2017; 284:228-243. 4.  Aortic atherosclerosis (ICD10-I70.0).     Electronically Signed   By: Leanna Battles M.D.   On: 12/22/2023 09:38      arrative & Impression  CLINICAL DATA:  Shortness  of breath on exertion, chest heaviness, dermatomyositis, Sjogren syndrome.   EXAM: CT CHEST WITHOUT CONTRAST   TECHNIQUE: Multidetector CT imaging of the chest was performed following the standard protocol without intravenous contrast. High resolution imaging of the lungs, as well as inspiratory and expiratory imaging, was performed.   RADIATION DOSE REDUCTION: This exam was performed according to the departmental dose-optimization program which includes automated exposure control, adjustment of the mA and/or kV according to patient size and/or use of iterative reconstruction technique.   COMPARISON:  None available.   FINDINGS: Cardiovascular: Atherosclerotic calcification of the aorta. Heart is enlarged. No pericardial effusion.   Mediastinum/Nodes: No pathologically enlarged mediastinal or axillary lymph nodes. Hilar regions are difficult to definitively evaluate without IV contrast. Air in the esophagus can be seen with dysmotility.    Lungs/Pleura: Negative for subpleural reticulation, traction bronchiectasis/bronchiolectasis, ground glass, architectural distortion or honeycombing. Minimal scattered pulmonary parenchymal scarring. 2 mm left upper lobe nodule (5/41). No pleural fluid. Airway is unremarkable. Minimal basilar air trapping.   Upper Abdomen: Visualized portions of the liver, adrenal glands, kidneys, spleen, pancreas, stomach and bowel are grossly unremarkable. No upper abdominal adenopathy.   Musculoskeletal: Degenerative changes in the spine.   IMPRESSION: 1. No evidence of interstitial lung disease. 2. Minimal basilar air trapping is indicative of small airways disease. 3. 2 mm left upper lobe nodule. No follow-up needed if patient is low-risk.This recommendation follows the consensus statement: Guidelines for Management of Incidental Pulmonary Nodules Detected on CT Images: From the Fleischner Society 2017; Radiology 2017; 284:228-243. 4.  Aortic atherosclerosis (ICD10-I70.0).     Electronically Signed   By: Leanna Battles M.D.   On: 12/22/2023 09:38       PFT     Latest Ref Rng & Units 12/14/2023   12:59 PM  PFT Results  FVC-Pre L 2.95   FVC-Predicted Pre % 95   FVC-Post L 2.89   FVC-Predicted Post % 93   Pre FEV1/FVC % % 81   Post FEV1/FCV % % 87   FEV1-Pre L 2.38   FEV1-Predicted Pre % 100   FEV1-Post L 2.50   DLCO uncorrected ml/min/mmHg 18.79   DLCO UNC% % 95   DLCO corrected ml/min/mmHg 18.97   DLCO COR %Predicted % 96   DLVA Predicted % 93   TLC L 5.04   TLC % Predicted % 99   RV % Predicted % 87     ECHO 3?19/25    1. Left ventricular ejection fraction, by estimation, is 60 to 65%. The  left ventricle has normal function. The left ventricle has no regional  wall motion abnormalities. There is mild concentric left ventricular  hypertrophy. Left ventricular diastolic  parameters are consistent with Grade I diastolic dysfunction (impaired  relaxation).   2.  Right ventricular systolic function is normal. The right ventricular  size is normal. There is normal pulmonary artery systolic pressure. The  estimated right ventricular systolic pressure is 29.0 mmHg.   3. Left atrial size was mildly dilated.   4. The mitral valve is grossly normal. Trivial mitral valve  regurgitation.   5. The aortic valve is tricuspid. Aortic valve regurgitation is not  visualized. Aortic valve mean gradient measures 3.0 mmHg.   6. The inferior vena cava is normal in size with greater than 50%  respiratory variability, suggesting right atrial pressure of 3 mmHg.   Comparison(s): No prior Echocardiogram.    LAB RESULTS last 96 hours No results found.  has a past medical history of Basal cell carcinoma, DDD (degenerative disc disease), lumbar, Dermatomyositis (HCC) (12/21/2014), Difficulty swallowing, Dry eyes, Dry mouth, Hypothyroidism, Mass of left thigh (12/21/2014), Pernicious anemia, and Rash.   reports that she has never smoked. She has been exposed to tobacco smoke. She has never used smokeless tobacco.  Past Surgical History:  Procedure Laterality Date   BACK SURGERY  1998   BASAL CELL CARCINOMA EXCISION  2023   BELPHAROPTOSIS REPAIR Bilateral 11/2014   MUSCLE BIOPSY Left 01/07/2015   Procedure: LEFT THIGH MUSCLE BIOPSY;  Surgeon: Claud Kelp, MD;  Location: [facility];  Service: General;  Laterality: Left;   SKIN BIOPSY  06/23/2022   5 places removed per patient   skin cancer removal Right 2020   hand   skin cancer removal     skin cancer removal     skin cancer removal   07/2017    Allergies  Allergen Reactions   Codeine Nausea And Vomiting   Hydrocodone Nausea And Vomiting    Immunization History  Administered Date(s) Administered   Influenza,inj,Quad PF,6+ Mos 09/29/2018   Influenza,inj,quad, With Preservative 07/12/2017   Moderna Sars-Covid-2 Vaccination 11/30/2019, 12/27/2019    Family History  Problem  Relation Age of Onset   Heart Problems Mother        Pacermaker   Breast cancer Mother    COPD Mother        never smoked, exp to second hand smoke   Diabetes Father    Osteoarthritis Sister    Kidney disease Sister 65   Rheum arthritis Sister    Thyroid disease Sister    Breast cancer Cousin        Maternal cousin     Current Outpatient Medications:    albuterol (VENTOLIN HFA) 108 (90 Base) MCG/ACT inhaler, Inhale 2 puffs into the lungs every 6 (six) hours as needed for wheezing or shortness of breath., Disp: 8 g, Rfl: 2   Acetaminophen (TYLENOL ARTHRITIS PAIN PO), Take by mouth as needed., Disp: , Rfl:    azaTHIOprine (IMURAN) 50 MG tablet, Take 2 tablets (100 mg total) by mouth daily., Disp: 180 tablet, Rfl: 0   baclofen (LIORESAL) 20 MG tablet, Take 20 mg by mouth 3 (three) times daily as needed., Disp: , Rfl:    BIOTIN PO, Take 500 mcg by mouth daily. (Patient not taking: Reported on 12/09/2023), Disp: , Rfl:    Calcium Carb-Cholecalciferol (CALCIUM 600 + D PO), Take by mouth daily., Disp: , Rfl:    cetirizine (ZYRTEC ALLERGY) 10 MG tablet, Take 10 mg by mouth daily., Disp: , Rfl:    cyanocobalamin (VITAMIN B12) 1000 MCG/ML injection, Inject into the muscle every 30 (thirty) days., Disp: , Rfl:    escitalopram (LEXAPRO) 20 MG tablet, Take 20 mg by mouth daily., Disp: , Rfl:    Glucosamine-Chondroitin-MSM 500-200-150 MG TABS, Take by mouth., Disp: , Rfl:    imiquimod (ALDARA) 5 % cream, Apply topically. (Patient not taking: Reported on 12/09/2023), Disp: , Rfl:    levothyroxine (SYNTHROID) 88 MCG tablet, Take 88 mcg by mouth daily., Disp: , Rfl:    Multiple Vitamins-Minerals (HAIR SKIN AND NAILS FORMULA PO), Take by mouth daily., Disp: , Rfl:    Multiple Vitamins-Minerals (MULTIVITAL PO), Take by mouth daily., Disp: , Rfl:    Omega-3 Fatty Acids (FISH OIL PO), Take by mouth., Disp: , Rfl:    omeprazole (PRILOSEC) 40 MG capsule, Take 40 mg by mouth daily., Disp: , Rfl: 1  traZODone  (DESYREL) 50 MG tablet, Take 1.5 tablets (75 mg total) by mouth at bedtime., Disp: 45 tablet, Rfl: 2   Vitamin D, Ergocalciferol, 2000 units CAPS, Take by mouth daily., Disp: , Rfl:   Current Facility-Administered Medications:    fluticasone furoate-vilanterol (BREO ELLIPTA) 100-25 MCG/ACT 1 puff, 1 puff, Inhalation, Daily,    triamcinolone acetonide (KENALOG) 10 MG/ML injection 10 mg, 10 mg, Other, Once, Asencion Islam, DPM      Objective:   There were no vitals filed for this visit.  Estimated body mass index is 28.19 kg/m as calculated from the following:   Height as of 12/09/23: 5\' 7"  (1.702 m).   Weight as of 12/09/23: 180 lb (81.6 kg).  @WEIGHTCHANGE @  There were no vitals filed for this visit.   Physical Exam   General: No distress. Looks wll O2 at rest: no Cane present: no Sitting in wheel chair: no Frail: no Obese: no Neuro: Alert and Oriented x 3. GCS 15. Speech normal Psych: Pleasant       Assessment:       ICD-10-CM   1. DOE (dyspnea on exertion)  R06.09 fluticasone furoate-vilanterol (BREO ELLIPTA) 100-25 MCG/ACT 1 puff    2. Seasonal allergies  J30.2 fluticasone furoate-vilanterol (BREO ELLIPTA) 100-25 MCG/ACT 1 puff    3. Pulmonary air trapping  R09.89 fluticasone furoate-vilanterol (BREO ELLIPTA) 100-25 MCG/ACT 1 puff    4. Grade I diastolic dysfunction  I51.89 fluticasone furoate-vilanterol (BREO ELLIPTA) 100-25 MCG/ACT 1 puff         Plan:     Patient Instructions     ICD-10-CM   1. DOE (dyspnea on exertion)  R06.09     2. Seasonal allergies  J30.2     3. Pulmonary air trapping  R09.89     4. Grade I diastolic dysfunction  I51.89      Let us treat empirically for non-allergic asthma and see if this help. IF does not, then consider stiff heart muscle (mild) and physical deconditoning as cause  Plan  - breo 1 puff daily - albuterol as needed  Followup  - video visit in 1 months iwht APP or MD   FOLLOWUP Return in about 4 weeks  (around 01/19/2024) for 15 min visit, VIDEO VISIT, with any of the APPS, wit Kandice Robinsons.    SIGNATURE    Dr. Kalman Shan, M.D., F.C.C.P,  Pulmonary and Critical Care Medicine Staff Physician, [facility] Director - Interstitial Lung Disease  Program  Pulmonary Fibrosis [facility] at [facility] [place], [place], [address]  Pager: [phone], If no answer or between  15:00h - 7:00h: call [phone] Telephone: [phone]  1:45 PM 12/22/2023

## 2023-12-22 NOTE — Patient Instructions (Addendum)
 ICD-10-CM   1. DOE (dyspnea on exertion)  R06.09     2. Seasonal allergies  J30.2     3. Pulmonary air trapping  R09.89     4. Grade I diastolic dysfunction  I51.89      Let us treat empirically for non-allergic asthma and see if this help. IF does not, then consider stiff heart muscle (mild) and physical deconditoning as cause  Plan  - breo 1 puff daily - albuterol as needed  Followup  - video visit in 1 months iwht APP or MD

## 2024-01-04 DIAGNOSIS — L570 Actinic keratosis: Secondary | ICD-10-CM | POA: Diagnosis not present

## 2024-01-04 DIAGNOSIS — Z85828 Personal history of other malignant neoplasm of skin: Secondary | ICD-10-CM | POA: Diagnosis not present

## 2024-01-04 DIAGNOSIS — L821 Other seborrheic keratosis: Secondary | ICD-10-CM | POA: Diagnosis not present

## 2024-01-04 DIAGNOSIS — L820 Inflamed seborrheic keratosis: Secondary | ICD-10-CM | POA: Diagnosis not present

## 2024-01-04 DIAGNOSIS — L905 Scar conditions and fibrosis of skin: Secondary | ICD-10-CM | POA: Diagnosis not present

## 2024-01-12 DIAGNOSIS — E538 Deficiency of other specified B group vitamins: Secondary | ICD-10-CM | POA: Diagnosis not present

## 2024-02-09 ENCOUNTER — Other Ambulatory Visit: Payer: Self-pay | Admitting: Physician Assistant

## 2024-02-09 DIAGNOSIS — E538 Deficiency of other specified B group vitamins: Secondary | ICD-10-CM | POA: Diagnosis not present

## 2024-02-09 DIAGNOSIS — M3313 Other dermatomyositis without myopathy: Secondary | ICD-10-CM

## 2024-02-09 NOTE — Telephone Encounter (Signed)
 Last Fill: 12/19/2023  Labs: 11/17/2023-CBC is WNL 12/08/2023 CMP normal except glucose is mildly elevated    Next Visit: 03/09/2024  Last Visit: 12/09/2023  DX: Dermatomyositis and Sjogren's syndrome   Current Dose per office note 12/09/2023: Imuran  100 mg daily   Okay to refill Imuran ?

## 2024-02-24 NOTE — Progress Notes (Signed)
 Office Visit Note  Patient: Carmen Woods  LALIA LOUDON             Date of Birth: 01/06/1956           MRN: [id number]             PCP: Orlena Bitters, MD Referring: Orlena Bitters, MD Visit Date: 03/09/2024 Occupation: @GUAROCC @  Subjective:  Dry mouth   History of Present Illness: Carmen  C Woods is a 68 y.o. female with history of dermatomyositis and sjogren's syndrome.  Patient remains on imuran  100 mg by mouth daily.  She is tolerating Imuran  without any side effects and has not had any recent gaps in therapy.  She denies any signs or symptoms of a dermatomyositis flare.  She has not noticed any increased muscular weakness.  She denies any recent rashes.  Patient states that she has been experiencing recurrent squamous cell carcinomas and has been following up with dermatology every 3 months. She continues to have chronic sicca symptoms due to Sjogren's syndrome.  Her mouth dryness has been severe despite using Biotene products for symptomatic relief.  Her mouth dryness has been interfering with her quality of life.  Patient plans on seeing the dentist every 4 months as recommended as well as ophthalmology on a yearly basis. Patient states that any joint pain has improved since starting glucosamine supplements daily.  She has also been using resistive bands which have been helpful.  She denies any joint swelling at this time. Patient states that she has been sleeping better at night since taking trazodone  75 mg at bedtime. She denies any recent or recurrent infections.      Activities of Daily Living:  Patient reports morning stiffness for 1 hour.   Patient Denies nocturnal pain.  Difficulty dressing/grooming: Denies Difficulty climbing stairs: Reports Difficulty getting out of chair: Denies Difficulty using hands for taps, buttons, cutlery, and/or writing: Reports  Review of Systems  Constitutional:  Negative for fatigue.  HENT:  Positive for mouth dryness. Negative for mouth  sores.   Eyes:  Positive for dryness.  Respiratory:  Positive for shortness of breath.   Cardiovascular:  Negative for chest pain and palpitations.  Gastrointestinal:  Positive for constipation and diarrhea. Negative for blood in stool.  Endocrine: Negative for increased urination.  Genitourinary:  Negative for involuntary urination.  Musculoskeletal:  Positive for joint pain, gait problem, joint pain, joint swelling, myalgias, muscle weakness, morning stiffness, muscle tenderness and myalgias.  Skin:  Positive for color change, rash, hair loss and sensitivity to sunlight.  Allergic/Immunologic: Negative for susceptible to infections.  Neurological:  Positive for headaches. Negative for dizziness.  Hematological:  Positive for swollen glands.  Psychiatric/Behavioral:  Positive for sleep disturbance. Negative for depressed mood. The patient is nervous/anxious.     PMFS History:  Patient Active Problem List   Diagnosis Date Noted   Basal cell carcinoma (BCC) 08/25/2017   Primary osteoarthritis of both hands 03/18/2017   Primary osteoarthritis of both feet 03/18/2017   High risk medication use 11/10/2016   Galtron's Papules 11/10/2016   Other fatigue 11/10/2016   Spondylosis of lumbar region without myelopathy or radiculopathy 11/10/2016   History of anxiety 11/10/2016   Vitamin D  deficiency 11/10/2016   History of vertigo 11/10/2016   Sjogren's syndrome with keratoconjunctivitis sicca (HCC) 11/10/2016   History of hypothyroidism 11/10/2016   Dermatomyositis (HCC) 01/07/2015   Mitral regurgitation 01/19/2013   Precordial pain 01/19/2013    Past Medical History:  Diagnosis Date  Basal cell carcinoma    DDD (degenerative disc disease), lumbar    Dermatomyositis (HCC) 12/21/2014   Difficulty swallowing    Dry eyes    Dry mouth    Hypothyroidism    Mass of left thigh 12/21/2014   Pernicious anemia    per patient   Rash    hands, left thigh    Family History  Problem  Relation Age of Onset   Heart Problems Mother        Pacermaker   Breast cancer Mother    COPD Mother        never smoked, exp to second hand smoke   Diabetes Father    Osteoarthritis Sister    Dementia Sister    Kidney disease Sister 16   Rheum arthritis Sister    Thyroid  disease Sister    Breast cancer Cousin        Maternal cousin   Past Surgical History:  Procedure Laterality Date   BACK SURGERY  1998   BASAL CELL CARCINOMA EXCISION  2023   BELPHAROPTOSIS REPAIR Bilateral 11/2014   MUSCLE BIOPSY Left 01/07/2015   Procedure: LEFT THIGH MUSCLE BIOPSY;  Surgeon: Boyce Byes, MD;  Location: [facility];  Service: General;  Laterality: Left;   SKIN BIOPSY  06/23/2022   5 places removed per patient   skin cancer removal Right 2020   hand   skin cancer removal     skin cancer removal  11/2023   skin cancer removal   07/2017   Social History   Social History Narrative   Not on file   Immunization History  Administered Date(s) Administered   Influenza,inj,Quad PF,6+ Mos 09/29/2018   Influenza,inj,quad, With Preservative 07/12/2017   Moderna Sars-Covid-2 Vaccination 11/30/2019, 12/27/2019     Objective: Vital Signs: BP 117/66 (BP Location: Left Arm, Patient Position: Sitting, Cuff Size: Normal)   Pulse (!) 56   Resp 16   Ht 5' 7 (1.702 m)   Wt 176 lb 3.2 oz (79.9 kg)   BMI 27.60 kg/m    Physical Exam Vitals and nursing note reviewed.  Constitutional:      Appearance: She is well-developed.  HENT:     Head: Normocephalic and atraumatic.   Eyes:     Conjunctiva/sclera: Conjunctivae normal.    Cardiovascular:     Rate and Rhythm: Normal rate and regular rhythm.     Heart sounds: Normal heart sounds.  Pulmonary:     Effort: Pulmonary effort is normal.     Breath sounds: Normal breath sounds.  Abdominal:     General: Bowel sounds are normal.     Palpations: Abdomen is soft.   Musculoskeletal:     Cervical back: Normal range of motion.   Lymphadenopathy:     Cervical: No cervical adenopathy.   Skin:    General: Skin is warm and dry.     Capillary Refill: Capillary refill takes less than 2 seconds.   Neurological:     Mental Status: She is alert and oriented to person, place, and time.   Psychiatric:        Behavior: Behavior normal.      Musculoskeletal Exam: Full strength of upper and lower extremities noted on examination today.  C-spine has good range of motion.  Shoulder joints, elbow joints, wrist joints, MCPs, PIPs, DIPs have good range of motion with no synovitis.  PIP and DIP thickening consistent with osteoarthritis of both hands.  Hip joints have good ROM with no groin  pain.  Discomfort range of motion of both knee joints.  No warmth or effusion noted.  Ankle joints have good range of motion no tenderness or joint swelling.  Mild edema in bilateral lower extremities.  CDAI Exam: CDAI Score: -- Patient Global: --; Provider Global: -- Swollen: --; Tender: -- Joint Exam 03/09/2024   No joint exam has been documented for this visit   There is currently no information documented on the homunculus. Go to the Rheumatology activity and complete the homunculus joint exam.  Investigation: No additional findings.  Imaging: No results found.  Recent Labs: Lab Results  Component Value Date   WBC 5.2 03/06/2024   HGB 13.2 03/06/2024   PLT 218 03/06/2024   NA 142 03/06/2024   K 4.8 03/06/2024   CL 105 03/06/2024   CO2 22 03/06/2024   GLUCOSE 97 03/06/2024   BUN 12 03/06/2024   CREATININE 0.74 03/06/2024   BILITOT 0.6 03/06/2024   ALKPHOS 86 03/06/2024   AST 21 03/06/2024   ALT 13 03/06/2024   PROT 6.8 03/06/2024   ALBUMIN 4.3 03/06/2024   CALCIUM 10.3 03/06/2024   GFRAA 108 03/18/2021    Speciality Comments: No specialty comments available.  Procedures:  No procedures performed Allergies: Codeine and Hydrocodone      Assessment / Plan:     Visit Diagnoses: Dermatomyositis [facility]): Positive  biopsy May 2016, elevated CK and aldolase, Gottron's papules, muscle weakness, fatigue, positive Ro 52, positive anti-chromatin: She has not had any signs or symptoms of a myositis flare.  No muscular weakness was noted on examination today.  No difficulty raising her arms above her head or rising from a seated position.  CK was within normal limits: 117 on 03/06/2024. She is clinically doing well taking Imuran  100 mg daily.  She continues to tolerate Imuran  without any side effects and has not had any recent gaps in therapy.  No recent or recurrent infections. Patient remains under close follow-up with dermatology.  No recurrence of Gottron's papules.  No other new rashes noted.  She continues to follow-up with dermatology every 3 months-due to history of recurrent squamous cell and basal cell carcinomas. Discussed the importance of remaining up-to-date with age-related cancer screenings.  Discussed increased risk for malignancy in patients with dermatomyositis.  According to the patient she is up-to-date with all age-related cancer screenings. She has established care with Dr. Bertrum Brodie for screening of ILD.  High-resolution chest CT from 12/04/2023 did not reveal any evidence of ILD.  PFTs performed on 12/14/2023.  Echocardiogram performed on 12/08/2023-grade 1 diastolic dysfunction. No new or worsening pulmonary symptoms. She will remain on Imuran  as prescribed.  She was advised to notify us  if she develops any signs or symptoms of a flare.  Plan to continue checking CK every 3 months. She will follow up in 3 months or sooner if needed.   Sjogren's syndrome with keratoconjunctivitis sicca (HCC) - +ANA, +Ro: Patient continues to have chronic sicca symptoms.  She has been experiencing severe dry mouth to the point that is interfering with her quality of life.  She has been using Biotene products for symptomatic relief but these have been ineffective.  She has started to schedule dentist appointments every 4  months as recommended.  She is been seeing ophthalmology on a yearly basis. Plan to initiate pilocarpine 5 mg 1 tablet 3 times daily as needed for symptomatic relief.  She is given informational handout about pilocarpine to review.  All questions were addressed. She will remain on  Imuran  as prescribed. Lab work from 03/06/2024 was reviewed with the patient today in the office: Ro antibody remains positive, SPEP normal, complements within normal limits, ESR within normal limits, and RF negative. Discussed the increased risk for developing lymphoma in patients with Sjogren's syndrome--SPEP normal on 03/06/2024. She will notify us  if she develops any new or worsening symptoms.  High risk medication use - Imuran  100 mg by mouth daily. CBC and CMP updated on 03/06/24. Her next lab work will be due in September and every 3 months.  No recent or recurrent infections.  Discussed the importance of holding imuran  if she develops signs or symptoms of an infection and to resume once the infection has completely cleared.    Primary osteoarthritis of both hands: She has PIP and DIP thickening consistent with osteoarthritis of both hands.  No synovitis noted on examination today.  Complete fist formation bilaterally.  Discussed the importance of joint protection and muscle strengthening.  Primary osteoarthritis of both knees - X-rays of both knees were updated today.x-rays from 2018 with the patient-previous findings were consistent with moderate OA of moderate chondromalacia patella. She has noticed an improvement in her knee joint pain since starting to take glucosamine on a daily basis as well as using resistive bands for lower extremity strengthening.  Primary osteoarthritis of both feet: She has good range of motion of both ankle joints.  No tenderness noted on exam.  Pedal edema noted bilateral extremities.  Spondylosis of lumbar region without myelopathy or radiculopathy: No symptoms of radiculopathy at this  time.  Other medical conditions are listed as follows:  Vitamin D  deficiency - She is taking vitamin D  2000 units daily.  History of pernicious anemia - She is receiving vitamin B12 at 1000 mcg injections every 30 days.  History of squamous cell carcinoma -Recurrent squamous cell basal cell carcinomas.  She continues to follow-up with dermatology every 3 months.  Basal cell carcinoma (BCC) of overlapping sites of skin: Followed by dermatology every 3 months.  History of hypothyroidism  History of anxiety  Other insomnia  Other fatigue  Orders: No orders of the defined types were placed in this encounter.  Meds ordered this encounter  Medications   pilocarpine (SALAGEN) 5 MG tablet    Sig: Take 1 tablet (5 mg total) by mouth 3 (three) times daily as needed.    Dispense:  90 tablet    Refill:  0    Follow-Up Instructions: Return in about 3 months (around 06/09/2024) for Systemic lupus erythematosus.   Romayne Clubs, PA-C  Note - This record has been created using Dragon software.  Chart creation errors have been sought, but may not always  have been located. Such creation errors do not reflect on  the standard of medical care.

## 2024-02-29 ENCOUNTER — Other Ambulatory Visit: Payer: Self-pay | Admitting: *Deleted

## 2024-02-29 DIAGNOSIS — Z79899 Other long term (current) drug therapy: Secondary | ICD-10-CM

## 2024-02-29 DIAGNOSIS — M3501 Sicca syndrome with keratoconjunctivitis: Secondary | ICD-10-CM

## 2024-02-29 DIAGNOSIS — M3313 Other dermatomyositis without myopathy: Secondary | ICD-10-CM

## 2024-03-06 DIAGNOSIS — Z79899 Other long term (current) drug therapy: Secondary | ICD-10-CM | POA: Diagnosis not present

## 2024-03-06 DIAGNOSIS — M3313 Other dermatomyositis without myopathy: Secondary | ICD-10-CM | POA: Diagnosis not present

## 2024-03-06 DIAGNOSIS — M3501 Sicca syndrome with keratoconjunctivitis: Secondary | ICD-10-CM | POA: Diagnosis not present

## 2024-03-06 NOTE — Progress Notes (Unsigned)
 [facility] contacted the office and states the patient is there to get labs drawn. Original orders were released to [facility], Cancelled [facility] orders and added the orders for [facility].

## 2024-03-07 ENCOUNTER — Ambulatory Visit: Payer: Self-pay | Admitting: Physician Assistant

## 2024-03-07 NOTE — Progress Notes (Signed)
 CBC and CMP WNL  CK WNL  RF negative  ESR WNL

## 2024-03-08 LAB — PROTEIN ELECTROPHORESIS, SERUM, WITH REFLEX
A/G Ratio: 1.1 (ref 0.7–1.7)
Albumin ELP: 3.6 g/dL (ref 2.9–4.4)
Alpha 1: 0.2 g/dL (ref 0.0–0.4)
Alpha 2: 0.6 g/dL (ref 0.4–1.0)
Beta: 1.1 g/dL (ref 0.7–1.3)
Gamma Globulin: 1.2 g/dL (ref 0.4–1.8)
Globulin, Total: 3.2 g/dL (ref 2.2–3.9)

## 2024-03-08 LAB — CMP14+EGFR
ALT: 13 IU/L (ref 0–32)
AST: 21 IU/L (ref 0–40)
Albumin: 4.3 g/dL (ref 3.9–4.9)
Alkaline Phosphatase: 86 IU/L (ref 44–121)
BUN/Creatinine Ratio: 16 (ref 12–28)
BUN: 12 mg/dL (ref 8–27)
Bilirubin Total: 0.6 mg/dL (ref 0.0–1.2)
CO2: 22 mmol/L (ref 20–29)
Calcium: 10.3 mg/dL (ref 8.7–10.3)
Chloride: 105 mmol/L (ref 96–106)
Creatinine, Ser: 0.74 mg/dL (ref 0.57–1.00)
Globulin, Total: 2.5 g/dL (ref 1.5–4.5)
Glucose: 97 mg/dL (ref 70–99)
Potassium: 4.8 mmol/L (ref 3.5–5.2)
Sodium: 142 mmol/L (ref 134–144)
Total Protein: 6.8 g/dL (ref 6.0–8.5)
eGFR: 89 mL/min/{1.73_m2} (ref >59)

## 2024-03-08 LAB — CBC WITH DIFFERENTIAL/PLATELET
Basophils Absolute: 0 10*3/uL (ref 0.0–0.2)
Basos: 1 %
EOS (ABSOLUTE): 0.2 10*3/uL (ref 0.0–0.4)
Eos: 4 %
Hematocrit: 39.8 % (ref 34.0–46.6)
Hemoglobin: 13.2 g/dL (ref 11.1–15.9)
Immature Grans (Abs): 0 10*3/uL (ref 0.0–0.1)
Immature Granulocytes: 0 %
Lymphocytes Absolute: 1.1 10*3/uL (ref 0.7–3.1)
Lymphs: 21 %
MCH: 29.6 pg (ref 26.6–33.0)
MCHC: 33.2 g/dL (ref 31.5–35.7)
MCV: 89 fL (ref 79–97)
Monocytes Absolute: 0.4 10*3/uL (ref 0.1–0.9)
Monocytes: 8 %
Neutrophils Absolute: 3.4 10*3/uL (ref 1.4–7.0)
Neutrophils: 66 %
Platelets: 218 10*3/uL (ref 150–450)
RBC: 4.46 x10E6/uL (ref 3.77–5.28)
RDW: 13 % (ref 11.7–15.4)
WBC: 5.2 10*3/uL (ref 3.4–10.8)

## 2024-03-08 LAB — C3 AND C4
Complement C3, Serum: 137 mg/dL (ref 82–167)
Complement C4, Serum: 21 mg/dL (ref 12–38)

## 2024-03-08 LAB — CK: Total CK: 117 U/L (ref 32–182)

## 2024-03-08 LAB — RHEUMATOID FACTOR: Rheumatoid fact SerPl-aCnc: 12.4 [IU]/mL (ref <14.0)

## 2024-03-08 LAB — SEDIMENTATION RATE: Sed Rate: 2 mm/h (ref 0–40)

## 2024-03-08 LAB — SJOGRENS SYNDROME-A EXTRACTABLE NUCLEAR ANTIBODY: ENA SSA (RO) Ab: 7.2 AI — ABNORMAL HIGH (ref 0.0–0.9)

## 2024-03-08 NOTE — Progress Notes (Signed)
 Ro antibody remains positive.   SPEP unremarkable  Complements WNL

## 2024-03-09 ENCOUNTER — Encounter: Payer: Self-pay | Admitting: Physician Assistant

## 2024-03-09 ENCOUNTER — Ambulatory Visit: Attending: Physician Assistant | Admitting: Physician Assistant

## 2024-03-09 VITALS — BP 117/66 | HR 56 | Resp 16 | Ht 67.0 in | Wt 176.2 lb

## 2024-03-09 DIAGNOSIS — M19041 Primary osteoarthritis, right hand: Secondary | ICD-10-CM | POA: Diagnosis not present

## 2024-03-09 DIAGNOSIS — Z8659 Personal history of other mental and behavioral disorders: Secondary | ICD-10-CM

## 2024-03-09 DIAGNOSIS — C4481 Basal cell carcinoma of overlapping sites of skin: Secondary | ICD-10-CM

## 2024-03-09 DIAGNOSIS — G4709 Other insomnia: Secondary | ICD-10-CM

## 2024-03-09 DIAGNOSIS — E559 Vitamin D deficiency, unspecified: Secondary | ICD-10-CM | POA: Diagnosis not present

## 2024-03-09 DIAGNOSIS — M170 Bilateral primary osteoarthritis of knee: Secondary | ICD-10-CM

## 2024-03-09 DIAGNOSIS — Z79899 Other long term (current) drug therapy: Secondary | ICD-10-CM

## 2024-03-09 DIAGNOSIS — M47816 Spondylosis without myelopathy or radiculopathy, lumbar region: Secondary | ICD-10-CM

## 2024-03-09 DIAGNOSIS — M19071 Primary osteoarthritis, right ankle and foot: Secondary | ICD-10-CM

## 2024-03-09 DIAGNOSIS — Z8639 Personal history of other endocrine, nutritional and metabolic disease: Secondary | ICD-10-CM

## 2024-03-09 DIAGNOSIS — M3313 Other dermatomyositis without myopathy: Secondary | ICD-10-CM

## 2024-03-09 DIAGNOSIS — M19072 Primary osteoarthritis, left ankle and foot: Secondary | ICD-10-CM

## 2024-03-09 DIAGNOSIS — R5383 Other fatigue: Secondary | ICD-10-CM

## 2024-03-09 DIAGNOSIS — Z862 Personal history of diseases of the blood and blood-forming organs and certain disorders involving the immune mechanism: Secondary | ICD-10-CM | POA: Diagnosis not present

## 2024-03-09 DIAGNOSIS — Z8589 Personal history of malignant neoplasm of other organs and systems: Secondary | ICD-10-CM | POA: Diagnosis not present

## 2024-03-09 DIAGNOSIS — M3501 Sicca syndrome with keratoconjunctivitis: Secondary | ICD-10-CM

## 2024-03-09 DIAGNOSIS — M19042 Primary osteoarthritis, left hand: Secondary | ICD-10-CM

## 2024-03-09 MED ORDER — PILOCARPINE HCL 5 MG PO TABS: 5.0000 mg | ORAL_TABLET | Freq: Three times a day (TID) | ORAL | 0 refills | Status: DC | PRN

## 2024-03-09 NOTE — Patient Instructions (Signed)
Pilocarpine Tablets What is this medication? PILOCARPINE (PYE loe KAR peen) treats dry mouth. It works by increasing the amount of saliva in the mouth, which makes it easier to speak and swallow. This medicine may be used for other purposes; ask your health care provider or pharmacist if you have questions. COMMON BRAND NAME(S): Salagen What should I tell my care team before I take this medication? They need to know if you have any of these conditions: Eye infection or other eye problems Glaucoma Heart disease Liver disease Lung or breathing disease, such as asthma An unusual or allergic reaction to pilocarpine, other medications, foods, dyes, or preservatives Pregnant or trying to get pregnant Breast-feeding How should I use this medication? Take this medication by mouth with a full glass of water. Take it as directed on the prescription label at the same time every day. Keep taking it unless your care team tells you to stop. Talk to your care team about the use of this medication in children. Special care may be needed. Overdosage: If you think you have taken too much of this medicine contact a poison control center or emergency room at once. NOTE: This medicine is only for you. Do not share this medicine with others. What if I miss a dose? If you miss a dose, take it as soon as you can. If it is almost time for your next dose, take only that dose. Do not take double or extra doses. What may interact with this medication? Antihistamines for allergy, cough, and cold Atropine Certain medications for Alzheimer disease, such as donepezil, galantamine, rivastigmine Certain medications for bladder problems, such as bethanechol, oxybutynin, tolterodine Certain medications for Parkinson disease, such as benztropine, trihexyphenidyl Certain medications for quitting smoking, such as nicotine Certain medications for stomach problems, such as dicyclomine, hyoscyamine Certain medications for  travel sickness, such as scopolamine Ipratropium Medications for blood pressure or heart problems, such as metoprolol This list may not describe all possible interactions. Give your health care provider a list of all the medicines, herbs, non-prescription drugs, or dietary supplements you use. Also tell them if you smoke, drink alcohol, or use illegal drugs. Some items may interact with your medicine. What should I watch for while using this medication? Visit your care team for regular checks on your progress. Tell your care team if your symptoms do not get better or if they get worse. You may get blurry vision or have trouble telling how far something is from you. This may be a problem at night or when the lights are low. Do not drive, use machinery, or do anything that needs clear vision until you know how this medication affects you. If you sweat a lot, drink enough to replace fluids. Do not get dehydrated. What side effects may I notice from receiving this medication? Side effects that you should report to your care team as soon as possible: Allergic reactions--skin rash, itching, hives, swelling of the face, lips, tongue, or throat Fast or irregular heartbeat Increase in blood pressure Low blood pressure--dizziness, feeling faint or lightheaded, blurry vision Slow heartbeat--dizziness, feeling faint or lightheaded, confusion, trouble breathing, unusual weakness or fatigue Side effects that usually do not require medical attention (report to your care team if they continue or are bothersome): Change in vision Chills Diarrhea Excessive sweating Flushing Headache Increased need to urinate This list may not describe all possible side effects. Call your doctor for medical advice about side effects. You may report side effects to FDA at 1-800-FDA-1088.   Where should I keep my medication? Keep out of the reach of children and pets. Store at room temperature between 15 and 30 degrees C (59 and  86 degrees F). Get rid of any unused medication after the expiration date. To get rid of medications that are no longer needed or have expired: Take the medications to a medication take-back program. Check with your pharmacy or law enforcement to find a location. If your cannot return the medication, check the label or package insert to see if the medication should be thrown out in the garbage or flushed down the toilet. If you are not sure, ask your care team. If it is safe to put it in the trash, take the medication out of the container. Mix the medication with cat litter, dirt, coffee grounds, or other unwanted substance. Seal the mixture in a bag or container. Put it in the trash. NOTE: This sheet is a summary. It may not cover all possible information. If you have questions about this medicine, talk to your doctor, pharmacist, or health care provider.  2024 Elsevier/Gold Standard (2021-08-22 00:00:00)  

## 2024-03-16 DIAGNOSIS — R52 Pain, unspecified: Secondary | ICD-10-CM | POA: Diagnosis not present

## 2024-03-16 DIAGNOSIS — R1032 Left lower quadrant pain: Secondary | ICD-10-CM | POA: Diagnosis not present

## 2024-03-16 DIAGNOSIS — E538 Deficiency of other specified B group vitamins: Secondary | ICD-10-CM | POA: Diagnosis not present

## 2024-03-16 DIAGNOSIS — Z299 Encounter for prophylactic measures, unspecified: Secondary | ICD-10-CM | POA: Diagnosis not present

## 2024-03-17 DIAGNOSIS — K8020 Calculus of gallbladder without cholecystitis without obstruction: Secondary | ICD-10-CM | POA: Diagnosis not present

## 2024-03-17 DIAGNOSIS — K5732 Diverticulitis of large intestine without perforation or abscess without bleeding: Secondary | ICD-10-CM | POA: Diagnosis not present

## 2024-03-17 DIAGNOSIS — K5792 Diverticulitis of intestine, part unspecified, without perforation or abscess without bleeding: Secondary | ICD-10-CM | POA: Diagnosis not present

## 2024-04-12 DIAGNOSIS — E538 Deficiency of other specified B group vitamins: Secondary | ICD-10-CM | POA: Diagnosis not present

## 2024-04-12 DIAGNOSIS — Z0000 Encounter for general adult medical examination without abnormal findings: Secondary | ICD-10-CM | POA: Diagnosis not present

## 2024-04-12 DIAGNOSIS — R5383 Other fatigue: Secondary | ICD-10-CM | POA: Diagnosis not present

## 2024-04-12 DIAGNOSIS — Z7189 Other specified counseling: Secondary | ICD-10-CM | POA: Diagnosis not present

## 2024-04-12 DIAGNOSIS — Z79899 Other long term (current) drug therapy: Secondary | ICD-10-CM | POA: Diagnosis not present

## 2024-04-12 DIAGNOSIS — Z299 Encounter for prophylactic measures, unspecified: Secondary | ICD-10-CM | POA: Diagnosis not present

## 2024-04-12 DIAGNOSIS — M8580 Other specified disorders of bone density and structure, unspecified site: Secondary | ICD-10-CM | POA: Diagnosis not present

## 2024-04-12 DIAGNOSIS — E7800 Pure hypercholesterolemia, unspecified: Secondary | ICD-10-CM | POA: Diagnosis not present

## 2024-04-12 DIAGNOSIS — Z1389 Encounter for screening for other disorder: Secondary | ICD-10-CM | POA: Diagnosis not present

## 2024-05-09 ENCOUNTER — Other Ambulatory Visit: Payer: Self-pay | Admitting: Internal Medicine

## 2024-05-09 MED ORDER — ALBUTEROL SULFATE HFA 108 (90 BASE) MCG/ACT IN AERS: 2.0000 | INHALATION_SPRAY | Freq: Four times a day (QID) | RESPIRATORY_TRACT | 0 refills | Status: DC | PRN

## 2024-05-31 ENCOUNTER — Telehealth: Payer: Self-pay

## 2024-05-31 NOTE — Telephone Encounter (Signed)
 Received signed prescription clarification form from Dr. Geronimo. Faxed back to [facility] at [phone] Received fax confirmation. NFN

## 2024-06-06 ENCOUNTER — Other Ambulatory Visit: Payer: Self-pay | Admitting: Physician Assistant

## 2024-06-06 DIAGNOSIS — M3313 Other dermatomyositis without myopathy: Secondary | ICD-10-CM

## 2024-06-06 NOTE — Telephone Encounter (Signed)
 Last Fill: 02/09/2024  Labs: 04/12/2024 ([facility]) Lymphs 0.5 Glucose 101 BUN/Creatinine Ratio 11  Next Visit: 06/27/2024  Last Visit: 03/09/2024  DX:  Dermatomyositis (HCC)   Current Dose per office note 03/09/2024: Imuran  100 mg by mouth daily   Okay to refill Imuran ?

## 2024-06-13 NOTE — Progress Notes (Signed)
 Office Visit Note  Patient: Carmen Woods             Date of Birth: [birth year]           MRN: [id number]             PCP: Rosamond Leta NOVAK, MD Referring: Rosamond Leta NOVAK, MD Visit Date: 06/27/2024 Occupation: CLERK  Subjective:  Pain in joints  History of Present Illness: Carmen Woods is a 68 y.o. female with dermatomyositis and Sjogren's.  She returns today after her last visit in June 2025.  She denies any increased muscular weakness or tenderness.  She continues to have dry mouth and dry eyes symptoms.  She denies any shortness of breath.  There is no history of lymphadenopathy.  She remains on Imuran  100 mg p.o. daily.  She continues to have some stiffness in her hands and knees.    Activities of Daily Living:  Patient reports morning stiffness for 1 hour.   Patient Reports nocturnal pain.  Difficulty dressing/grooming: Denies Difficulty climbing stairs: Reports Difficulty getting out of chair: Denies Difficulty using hands for taps, buttons, cutlery, and/or writing: Reports  Review of Systems  Constitutional:  Positive for fatigue.  HENT:  Positive for mouth dryness. Negative for mouth sores.   Eyes:  Positive for dryness.  Respiratory:  Positive for shortness of breath.   Cardiovascular:  Negative for chest pain and palpitations.  Gastrointestinal:  Positive for constipation and diarrhea. Negative for blood in stool.  Endocrine: Negative for increased urination.  Genitourinary:  Negative for involuntary urination.  Musculoskeletal:  Positive for joint pain, joint pain, joint swelling and morning stiffness. Negative for gait problem, myalgias, muscle weakness, muscle tenderness and myalgias.  Skin:  Positive for hair loss and sensitivity to sunlight. Negative for color change and rash.  Allergic/Immunologic: Negative for susceptible to infections.  Neurological:  Positive for headaches. Negative for dizziness.  Hematological:  Negative for swollen glands.   Psychiatric/Behavioral:  Positive for sleep disturbance. Negative for depressed mood. The patient is not nervous/anxious.     PMFS History:  Patient Active Problem List   Diagnosis Date Noted   Basal cell carcinoma (BCC) 08/25/2017   Primary osteoarthritis of both hands 03/18/2017   Primary osteoarthritis of both feet 03/18/2017   High risk medication use 11/10/2016   Galtron's Papules 11/10/2016   Other fatigue 11/10/2016   Spondylosis of lumbar region without myelopathy or radiculopathy 11/10/2016   History of anxiety 11/10/2016   Vitamin D  deficiency 11/10/2016   History of vertigo 11/10/2016   Sjogren's syndrome with keratoconjunctivitis sicca 11/10/2016   History of hypothyroidism 11/10/2016   Dermatomyositis (HCC) 01/07/2015   Mitral regurgitation 01/19/2013   Precordial pain 01/19/2013    Past Medical History:  Diagnosis Date   Basal cell carcinoma    DDD (degenerative disc disease), lumbar    Dermatomyositis (HCC) 12/21/2014   Difficulty swallowing    Dry eyes    Dry mouth    Hypothyroidism    Mass of left thigh 12/21/2014   Pernicious anemia    per patient   Rash    hands, left thigh    Family History  Problem Relation Age of Onset   Heart Problems Mother        Pacermaker   Breast cancer Mother    COPD Mother        never smoked, exp to second hand smoke   Diabetes Father    Osteoarthritis Sister    Dementia Sister  Kidney disease Sister 68   Rheum arthritis Sister    Thyroid  disease Sister    Breast cancer Cousin        Maternal cousin   Past Surgical History:  Procedure Laterality Date   BACK SURGERY  1998   BASAL CELL CARCINOMA EXCISION  2023   BELPHAROPTOSIS REPAIR Bilateral 11/2014   MUSCLE BIOPSY Left 01/07/2015   Procedure: LEFT THIGH MUSCLE BIOPSY;  Surgeon: Elon Pacini, MD;  Location: [facility];  Service: General;  Laterality: Left;   SKIN BIOPSY  06/23/2022   5 places removed per patient   skin cancer removal  Right 2020   hand   skin cancer removal     skin cancer removal  11/2023   skin cancer removal   07/2017   Social History   Tobacco Use   Smoking status: Never    Passive exposure: Past   Smokeless tobacco: Never  Vaping Use   Vaping status: Never Used  Substance Use Topics   Alcohol  use: No   Drug use: No   Social History   Social History Narrative   Not on file     Immunization History  Administered Date(s) Administered   Influenza,inj,Quad PF,6+ Mos 09/29/2018   Influenza,inj,quad, With Preservative 07/12/2017   Moderna Sars-Covid-2 Vaccination 11/30/2019, 12/27/2019     Objective: Vital Signs: BP 138/82   Pulse 68   Temp (!) 97.4 F (36.3 C)   Resp 15   Ht 5' 7 (1.702 m)   Wt 176 lb 3.2 oz (79.9 kg)   BMI 27.60 kg/m    Physical Exam Vitals and nursing note reviewed.  Constitutional:      Appearance: She is well-developed.  HENT:     Head: Normocephalic and atraumatic.  Eyes:     Conjunctiva/sclera: Conjunctivae normal.  Cardiovascular:     Rate and Rhythm: Normal rate and regular rhythm.     Heart sounds: Normal heart sounds.  Pulmonary:     Effort: Pulmonary effort is normal.     Breath sounds: Normal breath sounds.  Abdominal:     General: Bowel sounds are normal.     Palpations: Abdomen is soft.  Musculoskeletal:     Cervical back: Normal range of motion.  Lymphadenopathy:     Cervical: No cervical adenopathy.  Skin:    General: Skin is warm and dry.     Capillary Refill: Capillary refill takes less than 2 seconds.  Neurological:     Mental Status: She is alert and oriented to person, place, and time.  Psychiatric:        Behavior: Behavior normal.      Musculoskeletal Exam: Cervical was in good range of motion.  There was no tenderness over thoracic or lumbar spine.  There was no SI joint tenderness.  Shoulder joints, elbow joints, wrist joints, MCPs, PIPs and DIPs were in good range of motion with no synovitis.  Bilateral PIP and DIP  thickening was noted.  Hip joints and knee joints were in good range of motion without any warmth swelling or effusion.  She had discomfort range of motion of her knee joints.  There was no tenderness over ankles or MTPs.  No muscular weakness or tenderness was noted.   CDAI Exam: CDAI Score: -- Patient Global: --; Provider Global: -- Swollen: --; Tender: -- Joint Exam 06/27/2024   No joint exam has been documented for this visit   There is currently no information documented on the homunculus. Go to the  Rheumatology activity and complete the homunculus joint exam.  Investigation: No additional findings.  Imaging: No results found.  Recent Labs: Lab Results  Component Value Date   WBC 5.2 03/06/2024   HGB 13.2 03/06/2024   PLT 218 03/06/2024   NA 142 03/06/2024   K 4.8 03/06/2024   CL 105 03/06/2024   CO2 22 03/06/2024   GLUCOSE 97 03/06/2024   BUN 12 03/06/2024   CREATININE 0.74 03/06/2024   BILITOT 0.6 03/06/2024   ALKPHOS 86 03/06/2024   AST 21 03/06/2024   ALT 13 03/06/2024   PROT 6.8 03/06/2024   ALBUMIN 4.3 03/06/2024   CALCIUM 10.3 03/06/2024   GFRAA 108 03/18/2021    Speciality Comments: No specialty comments available.  Procedures:  No procedures performed Allergies: Codeine and Hydrocodone    Assessment / Plan:     Visit Diagnoses: Dermatomyositis (HCC) - Positive biopsy May 2016, elevated CK and aldolase, Gottron's papules, muscle weakness, fatigue, positive Ro 52, positive anti-chromatin: -She denies any increased muscular weakness or tenderness.  She had good muscle strength in all 4 extremities.  No rash was noted.  CK was 117 on March 06, 2024.  Plan: CK, Sedimentation rate  Sjogren's syndrome with keratoconjunctivitis sicca - +ANA, +Ro: -She continues to have dry mouth and dry eyes symptoms.  Over-the-counter products were discussed.  A prescription refill for pilocarpine  was sent.  Plan: pilocarpine  (SALAGEN ) 5 MG tablet, Protein / creatinine ratio,  urine, Anti-DNA antibody, double-stranded, C3 and C4.  Patient was evaluated by Dr. Tonna in the past and had CT scan of the chest in March 2025 which was negative for ILD.  She had PFTs and echocardiogram in March 2025.  High risk medication use - Imuran  100 mg by mouth daily. -CBC and CMP were normal on March 06, 2024.  Will recheck labs today.  Plan: CBC with Differential/Platelet, Comprehensive metabolic panel with GFR  Primary osteoarthritis of both hands-she continues to have pain and discomfort in her hands.  Bilateral PIP and DIP thickening was noted.  No synovitis was noted.  Primary osteoarthritis of both knees -she continues to have ongoing discomfort in her knee joints.  No warmth swelling or effusion was noted.  X-rays of both knees were updated today.x-rays from 2018 with the patient-previous findings were consistent with moderate OA of moderate chondromalacia patella.  Primary osteoarthritis of both feet-proper fitting shoes were advised.  Spondylosis of lumbar region without myelopathy or radiculopathy-she gives history of intermittent back pain.  Vitamin D  deficiency-vitamin D  was normal at 17 2023.  History of pernicious anemia  Diverticulitis-patient gives history of recent bout of abdominal pain and was diagnosed with diverticulitis.  Patient states she was treated with antibiotics.  History of squamous cell carcinoma - Recurrent squamous cell basal cell carcinomas.  History of hypothyroidism  Basal cell carcinoma (BCC) of overlapping sites of skin - Followed by dermatology every 3 months.  History of anxiety  Other fatigue  Other insomnia  Orders: Orders Placed This Encounter  Procedures   CBC with Differential/Platelet   Comprehensive metabolic panel with GFR   CK   Protein / creatinine ratio, urine   Sedimentation rate   Anti-DNA antibody, double-stranded   C3 and C4   Meds ordered this encounter  Medications   pilocarpine  (SALAGEN ) 5 MG tablet     Sig: Take 1 tablet (5 mg total) by mouth 3 (three) times daily as needed.    Dispense:  90 tablet    Refill:  0  Follow-Up Instructions: Return in about 5 months (around 11/25/2024) for Sjogren's, Dermatomyositis.   Maya Nash, MD  Note - This record has been created using Animal nutritionist.  Chart creation errors have been sought, but may not always  have been located. Such creation errors do not reflect on  the standard of medical care.

## 2024-06-22 ENCOUNTER — Other Ambulatory Visit: Payer: Self-pay | Admitting: Internal Medicine

## 2024-06-27 ENCOUNTER — Encounter: Payer: Self-pay | Admitting: Rheumatology

## 2024-06-27 ENCOUNTER — Ambulatory Visit: Attending: Rheumatology | Admitting: Rheumatology

## 2024-06-27 VITALS — BP 138/82 | HR 68 | Temp 97.4°F | Resp 15 | Ht 67.0 in | Wt 176.2 lb

## 2024-06-27 DIAGNOSIS — K5792 Diverticulitis of intestine, part unspecified, without perforation or abscess without bleeding: Secondary | ICD-10-CM | POA: Diagnosis not present

## 2024-06-27 DIAGNOSIS — M3313 Other dermatomyositis without myopathy: Secondary | ICD-10-CM

## 2024-06-27 DIAGNOSIS — Z8639 Personal history of other endocrine, nutritional and metabolic disease: Secondary | ICD-10-CM

## 2024-06-27 DIAGNOSIS — Z79899 Other long term (current) drug therapy: Secondary | ICD-10-CM | POA: Diagnosis not present

## 2024-06-27 DIAGNOSIS — M170 Bilateral primary osteoarthritis of knee: Secondary | ICD-10-CM | POA: Diagnosis not present

## 2024-06-27 DIAGNOSIS — M19041 Primary osteoarthritis, right hand: Secondary | ICD-10-CM

## 2024-06-27 DIAGNOSIS — M47816 Spondylosis without myelopathy or radiculopathy, lumbar region: Secondary | ICD-10-CM

## 2024-06-27 DIAGNOSIS — G4709 Other insomnia: Secondary | ICD-10-CM

## 2024-06-27 DIAGNOSIS — E559 Vitamin D deficiency, unspecified: Secondary | ICD-10-CM

## 2024-06-27 DIAGNOSIS — Z8659 Personal history of other mental and behavioral disorders: Secondary | ICD-10-CM

## 2024-06-27 DIAGNOSIS — R5383 Other fatigue: Secondary | ICD-10-CM

## 2024-06-27 DIAGNOSIS — M19071 Primary osteoarthritis, right ankle and foot: Secondary | ICD-10-CM | POA: Diagnosis not present

## 2024-06-27 DIAGNOSIS — Z862 Personal history of diseases of the blood and blood-forming organs and certain disorders involving the immune mechanism: Secondary | ICD-10-CM

## 2024-06-27 DIAGNOSIS — Z8589 Personal history of malignant neoplasm of other organs and systems: Secondary | ICD-10-CM | POA: Diagnosis not present

## 2024-06-27 DIAGNOSIS — M19042 Primary osteoarthritis, left hand: Secondary | ICD-10-CM

## 2024-06-27 DIAGNOSIS — M19072 Primary osteoarthritis, left ankle and foot: Secondary | ICD-10-CM

## 2024-06-27 DIAGNOSIS — M3501 Sicca syndrome with keratoconjunctivitis: Secondary | ICD-10-CM | POA: Diagnosis not present

## 2024-06-27 DIAGNOSIS — C4481 Basal cell carcinoma of overlapping sites of skin: Secondary | ICD-10-CM

## 2024-06-27 MED ORDER — PILOCARPINE HCL 5 MG PO TABS: 5.0000 mg | ORAL_TABLET | Freq: Three times a day (TID) | ORAL | 0 refills | Status: DC | PRN

## 2024-06-27 NOTE — Patient Instructions (Signed)
 Standing Labs We placed an order today for your standing lab work.   Please have your standing labs drawn in  January and every 3 months  Please have your labs drawn 2 weeks prior to your appointment so that the provider can discuss your lab results at your appointment, if possible.  Please note that you may see your imaging and lab results in MyChart before we have reviewed them. We will contact you once all results are reviewed. Please allow our office up to 72 hours to thoroughly review all of the results before contacting the office for clarification of your results.  WALK-IN LAB HOURS  Monday through Thursday from 8:00 am -12:30 pm and 1:00 pm-4:30 pm and Friday from 8:00 am-12:00 pm.  Patients with office visits requiring labs will be seen before walk-in labs.  You may encounter longer than normal wait times. Please allow additional time. Wait times may be shorter on  Monday and Thursday afternoons.  We do not book appointments for walk-in labs. We appreciate your patience and understanding with our staff.   Labs are drawn by [facility]. Please bring your co-pay at the time of your lab draw.  You may receive a bill from [facility] for your lab work.  Please note if you are on Hydroxychloroquine and and an order has been placed for a Hydroxychloroquine level,  you will need to have it drawn 4 hours or more after your last dose.  If you wish to have your labs drawn at another location, please call the office 24 hours in advance so we can fax the orders.  The office is located at [address], [place], [place] [address]   If you have any questions regarding directions or hours of operation,  please call [phone].   As a reminder, please drink plenty of water prior to coming for your lab work. Thanks!   Vaccines You are taking a medication(s) that can suppress your immune system.  The following immunizations are recommended: Flu annually Covid-19  RSV Td/Tdap (tetanus,  diphtheria, pertussis) every 10 years Pneumonia (Prevnar 15 then Pneumovax 23 at least 1 year apart.  Alternatively, can take Prevnar 20 without needing additional dose) Shingrix: 2 doses from 4 weeks to 6 months apart  Please check with your PCP to make sure you are up to date.

## 2024-06-28 ENCOUNTER — Other Ambulatory Visit: Payer: Self-pay

## 2024-06-28 DIAGNOSIS — M3313 Other dermatomyositis without myopathy: Secondary | ICD-10-CM

## 2024-06-28 LAB — ANTI-DNA ANTIBODY, DOUBLE-STRANDED: ds DNA Ab: <1 [IU]/mL

## 2024-06-28 LAB — CBC WITH DIFFERENTIAL/PLATELET
Absolute Lymphocytes: 994 {cells}/uL (ref 850–3900)
Absolute Monocytes: 329 {cells}/uL (ref 200–950)
Basophils Absolute: 32 {cells}/uL (ref 0–200)
Basophils Relative: 0.6 %
Eosinophils Absolute: 92 {cells}/uL (ref 15–500)
Eosinophils Relative: 1.7 %
HCT: 38.1 % (ref 35.0–45.0)
Hemoglobin: 12.7 g/dL (ref 11.7–15.5)
MCH: 29.5 pg (ref 27.0–33.0)
MCHC: 33.3 g/dL (ref 32.0–36.0)
MCV: 88.6 fL (ref 80.0–100.0)
MPV: 10.7 fL (ref 7.5–12.5)
Monocytes Relative: 6.1 %
Neutro Abs: 3953 {cells}/uL (ref 1500–7800)
Neutrophils Relative %: 73.2 %
Platelets: 204 Thousand/uL (ref 140–400)
RBC: 4.3 Million/uL (ref 3.80–5.10)
RDW: 12.5 % (ref 11.0–15.0)
Total Lymphocyte: 18.4 %
WBC: 5.4 Thousand/uL (ref 3.8–10.8)

## 2024-06-28 LAB — COMPREHENSIVE METABOLIC PANEL WITH GFR
AG Ratio: 1.7 (calc) (ref 1.0–2.5)
ALT: 10 U/L (ref 6–29)
AST: 19 U/L (ref 10–35)
Albumin: 4.2 g/dL (ref 3.6–5.1)
Alkaline phosphatase (APISO): 68 U/L (ref 37–153)
BUN: 13 mg/dL (ref 7–25)
CO2: 29 mmol/L (ref 20–32)
Calcium: 10.1 mg/dL (ref 8.6–10.4)
Chloride: 105 mmol/L (ref 98–110)
Creat: 0.75 mg/dL (ref 0.50–1.05)
Globulin: 2.5 g/dL (ref 1.9–3.7)
Glucose, Bld: 105 mg/dL — ABNORMAL HIGH (ref 65–99)
Potassium: 4.1 mmol/L (ref 3.5–5.3)
Sodium: 140 mmol/L (ref 135–146)
Total Bilirubin: 0.4 mg/dL (ref 0.2–1.2)
Total Protein: 6.7 g/dL (ref 6.1–8.1)
eGFR: 87 mL/min/1.73m2 (ref >=60)

## 2024-06-28 LAB — PROTEIN / CREATININE RATIO, URINE
Creatinine, Urine: 39 mg/dL (ref 20–275)
Protein/Creat Ratio: 103 mg/g{creat} (ref 24–184)
Protein/Creatinine Ratio: 0.103 mg/mg{creat} (ref 0.024–0.184)
Total Protein, Urine: 4 mg/dL — ABNORMAL LOW (ref 5–24)

## 2024-06-28 LAB — SEDIMENTATION RATE: Sed Rate: 11 mm/h (ref 0–30)

## 2024-06-28 LAB — CK: Total CK: 78 U/L (ref 20–243)

## 2024-06-28 LAB — C3 AND C4
C3 Complement: 147 mg/dL (ref 83–193)
C4 Complement: 22 mg/dL (ref 15–57)

## 2024-06-28 MED ORDER — AZATHIOPRINE 50 MG PO TABS: 100.0000 mg | ORAL_TABLET | Freq: Every day | ORAL | 0 refills | Status: DC

## 2024-06-28 NOTE — Telephone Encounter (Signed)
 Refill request for Imuran  received via fax from [facility].   Last Fill: 06/06/2024 (30 day supply)  Labs: 06/27/2024 (not reviewed yet)- glucose 105, total protein urine 4  Next Visit: 11/27/2024  Last Visit: 06/27/2024  DX: Dermatomyositis   Current Dose per office note on 06/27/2024: Imuran  100 mg by mouth daily.   Okay to refill Imuran ?

## 2024-06-29 ENCOUNTER — Ambulatory Visit: Payer: Self-pay | Admitting: Rheumatology

## 2024-06-29 NOTE — Progress Notes (Signed)
 CMP normal, CBC normal, urine protein creatinine ratio normal, CK normal, sed rate normal, dsDNA negative, complements normal.  No change in treatment advised.

## 2024-06-30 ENCOUNTER — Other Ambulatory Visit: Payer: Self-pay | Admitting: Physician Assistant

## 2024-06-30 DIAGNOSIS — M3501 Sicca syndrome with keratoconjunctivitis: Secondary | ICD-10-CM

## 2024-07-04 ENCOUNTER — Other Ambulatory Visit: Payer: Self-pay

## 2024-07-04 DIAGNOSIS — M3501 Sicca syndrome with keratoconjunctivitis: Secondary | ICD-10-CM

## 2024-07-04 DIAGNOSIS — M3313 Other dermatomyositis without myopathy: Secondary | ICD-10-CM

## 2024-07-04 MED ORDER — PILOCARPINE HCL 5 MG PO TABS: 5.0000 mg | ORAL_TABLET | Freq: Three times a day (TID) | ORAL | 0 refills | Status: AC | PRN

## 2024-07-04 MED ORDER — AZATHIOPRINE 50 MG PO TABS
100.0000 mg | ORAL_TABLET | Freq: Every day | ORAL | 0 refills | Status: DC
Start: 2024-07-04 — End: 2024-08-14

## 2024-07-04 NOTE — Progress Notes (Signed)
 Patient  is requesting that Imuran  be sent to [facility] for a 90 day supply and pilocarpine  be sent to [facility] for a 30 day supply due to the cost.   Prescriptions were just sent to the pharmacies on 06/27/2024 and 06/28/2024.  Both have been re-sent to the correct pharmacies, per patient's request.

## 2024-07-12 ENCOUNTER — Other Ambulatory Visit: Payer: Self-pay | Admitting: Physician Assistant

## 2024-07-12 DIAGNOSIS — M3313 Other dermatomyositis without myopathy: Secondary | ICD-10-CM

## 2024-07-17 ENCOUNTER — Other Ambulatory Visit: Payer: Self-pay | Admitting: Rheumatology

## 2024-07-17 DIAGNOSIS — M3501 Sicca syndrome with keratoconjunctivitis: Secondary | ICD-10-CM

## 2024-08-04 ENCOUNTER — Other Ambulatory Visit: Payer: Self-pay

## 2024-08-04 NOTE — Progress Notes (Unsigned)
 Patient contacted the office and states she needs a refill of Azathioprine . Advised the patient a 3 month supply was sent in on 07/04/2024. Patient states she was advised by pharmacy that she did not have any fills. Contacted [facility] and spoke with Leighanne. Brad states they did not receive the prescription sent on 07/04/2024.

## 2024-08-09 ENCOUNTER — Ambulatory Visit
Admission: RE | Admit: 2024-08-09 | Discharge: 2024-08-09 | Disposition: A | Discharge status: Home / Self Care | Source: Ambulatory Visit | Attending: Nurse Practitioner | Admitting: Nurse Practitioner

## 2024-08-09 ENCOUNTER — Other Ambulatory Visit: Payer: Self-pay | Admitting: Nurse Practitioner

## 2024-08-09 DIAGNOSIS — Z1231 Encounter for screening mammogram for malignant neoplasm of breast: Secondary | ICD-10-CM

## 2024-08-13 ENCOUNTER — Other Ambulatory Visit: Payer: Self-pay | Admitting: Physician Assistant

## 2024-08-13 DIAGNOSIS — M3313 Other dermatomyositis without myopathy: Secondary | ICD-10-CM

## 2024-08-14 ENCOUNTER — Other Ambulatory Visit: Payer: Self-pay

## 2024-08-14 DIAGNOSIS — M3501 Sicca syndrome with keratoconjunctivitis: Secondary | ICD-10-CM

## 2024-08-14 DIAGNOSIS — M3313 Other dermatomyositis without myopathy: Secondary | ICD-10-CM

## 2024-08-14 MED ORDER — AZATHIOPRINE 50 MG PO TABS: 100.0000 mg | ORAL_TABLET | Freq: Every day | ORAL | 0 refills | Status: DC

## 2024-08-14 NOTE — Progress Notes (Unsigned)
 Contacted Stormy at [facility] and she states they never received the Azathioprine  prescription for the patient from 07/04/2024. Stormy requested we resend the prescription and verified their address. Will resend Azathioprine  prescription today.

## 2024-08-15 ENCOUNTER — Other Ambulatory Visit: Payer: Self-pay | Admitting: *Deleted

## 2024-08-15 DIAGNOSIS — M3313 Other dermatomyositis without myopathy: Secondary | ICD-10-CM

## 2024-08-15 MED ORDER — AZATHIOPRINE 50 MG PO TABS: 100.0000 mg | ORAL_TABLET | Freq: Every day | ORAL | 0 refills | Status: DC

## 2024-08-15 NOTE — Telephone Encounter (Signed)
 Patient states she is out of medications and it takes about 2 weeks to receive from [facility]. Patient would like a short supply sent to her local pharmacy, [facility] in [place].    Labs: 06/27/2024 CMP normal, CBC normal, urine protein creatinine ratio normal, CK normal, sed rate normal, dsDNA negative, complements normal. No change in treatment advised.   Next Visit: 11/27/2024   Last Visit: 06/27/2024   DX: Dermatomyositis   Current Dose per office note 06/27/2024: Imuran  100 mg by mouth daily.   Okay to refill Imuran ?

## 2024-09-01 ENCOUNTER — Telehealth: Payer: Self-pay

## 2024-09-01 NOTE — Telephone Encounter (Signed)
 Patient contacted the office and states she had emergency gallbladder surgery last night. Patient states the doctor at the hospital had stopped the patient's Imuran . Patient states she was advised she had to be off the Imuran  while on her antibiotic. Patient states she was advised by Dr. Dolphus that she should not stop her medication. Patient inquired if she should stop her Imuran  and if so how long until she can start it again. Please advise.

## 2024-09-01 NOTE — Telephone Encounter (Signed)
 Patient advised She should follow the recommendations of the surgeon. Her labs have been stable. Should be okay to hold Imuran  for couple of weeks. Patient verbalized understanding.

## 2024-09-01 NOTE — Telephone Encounter (Signed)
 She should follow the recommendations of the surgeon.  Her labs have been stable.  Should be okay to hold  Imuran  for couple of weeks.

## 2024-09-26 ENCOUNTER — Other Ambulatory Visit: Payer: Self-pay

## 2024-09-26 MED ORDER — TRAZODONE HCL 50 MG PO TABS: 75.0000 mg | ORAL_TABLET | Freq: Every day | ORAL | 2 refills | Status: AC

## 2024-09-26 NOTE — Telephone Encounter (Signed)
 Last Fill: 11/16/2023  Next Visit: 11/27/2024  Last Visit: 06/27/2024  DX: Other insomnia   Current Dose per office note on 06/27/2024: dose not mentioned.   Okay to refill trazodone ?

## 2024-10-15 ENCOUNTER — Other Ambulatory Visit: Payer: Self-pay | Admitting: Rheumatology

## 2024-10-15 DIAGNOSIS — M3313 Other dermatomyositis without myopathy: Secondary | ICD-10-CM

## 2024-10-15 NOTE — Telephone Encounter (Signed)
Please advise patient that the labs are due this month

## 2024-10-16 NOTE — Telephone Encounter (Addendum)
 Patient advised that labs are due this month, patient would like labs to be sent to [facility] in [place]. Patient will call the office before going.   Patient does mention having some labs done on 09/02/2024, wants to know if she will still need to re-due them? If labs are still needed I will add them.

## 2024-10-17 NOTE — Telephone Encounter (Signed)
 Patient advised Dr. Dolphus reviewed her labs from December 20 she may had repeat labs in March. Patient verbalized understanding.

## 2024-10-17 NOTE — Telephone Encounter (Signed)
 I reviewed her labs from December 20 she may had repeat labs in March.

## 2024-11-27 ENCOUNTER — Ambulatory Visit: Admitting: Physician Assistant
